# Patient Record
Sex: Male | Born: 1975 | Hispanic: Yes | Smoking: Former smoker
Health system: Southern US, Community
[De-identification: ages and names within clinical notes are randomized; demographics above are authoritative.]

## PROBLEM LIST (undated history)

## (undated) DIAGNOSIS — D631 Anemia in chronic kidney disease: Secondary | ICD-10-CM

## (undated) DIAGNOSIS — N529 Male erectile dysfunction, unspecified: Secondary | ICD-10-CM

## (undated) DIAGNOSIS — Z87898 Personal history of other specified conditions: Secondary | ICD-10-CM

## (undated) DIAGNOSIS — N184 Chronic kidney disease, stage 4 (severe): Secondary | ICD-10-CM

## (undated) DIAGNOSIS — E785 Hyperlipidemia, unspecified: Secondary | ICD-10-CM

## (undated) DIAGNOSIS — M8660 Other chronic osteomyelitis, unspecified site: Secondary | ICD-10-CM

## (undated) DIAGNOSIS — E119 Type 2 diabetes mellitus without complications: Secondary | ICD-10-CM

## (undated) DIAGNOSIS — Z794 Long term (current) use of insulin: Secondary | ICD-10-CM

## (undated) DIAGNOSIS — J069 Acute upper respiratory infection, unspecified: Secondary | ICD-10-CM

## (undated) DIAGNOSIS — I10 Essential (primary) hypertension: Secondary | ICD-10-CM

## (undated) DIAGNOSIS — N183 Chronic kidney disease, stage 3 unspecified: Secondary | ICD-10-CM

## (undated) DIAGNOSIS — T4145XA Adverse effect of unspecified anesthetic, initial encounter: Secondary | ICD-10-CM

## (undated) DIAGNOSIS — Z720 Tobacco use: Secondary | ICD-10-CM

## (undated) DIAGNOSIS — Z8489 Family history of other specified conditions: Secondary | ICD-10-CM

## (undated) DIAGNOSIS — E11621 Type 2 diabetes mellitus with foot ulcer: Secondary | ICD-10-CM

## (undated) DIAGNOSIS — E1122 Type 2 diabetes mellitus with diabetic chronic kidney disease: Secondary | ICD-10-CM

## (undated) DIAGNOSIS — R112 Nausea with vomiting, unspecified: Secondary | ICD-10-CM

## (undated) DIAGNOSIS — Z9889 Other specified postprocedural states: Secondary | ICD-10-CM

## (undated) DIAGNOSIS — E1140 Type 2 diabetes mellitus with diabetic neuropathy, unspecified: Secondary | ICD-10-CM

## (undated) DIAGNOSIS — L409 Psoriasis, unspecified: Secondary | ICD-10-CM

## (undated) DIAGNOSIS — T8859XA Other complications of anesthesia, initial encounter: Secondary | ICD-10-CM

## (undated) DIAGNOSIS — L97519 Non-pressure chronic ulcer of other part of right foot with unspecified severity: Secondary | ICD-10-CM

## (undated) DIAGNOSIS — G709 Myoneural disorder, unspecified: Secondary | ICD-10-CM

## (undated) DIAGNOSIS — K219 Gastro-esophageal reflux disease without esophagitis: Secondary | ICD-10-CM

## (undated) DIAGNOSIS — F329 Major depressive disorder, single episode, unspecified: Secondary | ICD-10-CM

## (undated) DIAGNOSIS — F32A Depression, unspecified: Secondary | ICD-10-CM

## (undated) DIAGNOSIS — Z9989 Dependence on other enabling machines and devices: Secondary | ICD-10-CM

## (undated) DIAGNOSIS — H3500 Unspecified background retinopathy: Secondary | ICD-10-CM

## (undated) HISTORY — DX: Type 2 diabetes mellitus with foot ulcer: E11.621

## (undated) HISTORY — DX: Other chronic osteomyelitis, unspecified site: M86.60

## (undated) HISTORY — PX: TOE AMPUTATION: SHX809

## (undated) HISTORY — PX: FOOT SURGERY: SHX648

---
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
## 2013-07-10 DIAGNOSIS — Z2821 Immunization not carried out because of patient refusal: Secondary | ICD-10-CM | POA: Insufficient documentation

## 2015-08-09 ENCOUNTER — Ambulatory Visit: Payer: Self-pay | Admitting: Podiatry

## 2015-10-06 DIAGNOSIS — L97529 Non-pressure chronic ulcer of other part of left foot with unspecified severity: Secondary | ICD-10-CM | POA: Insufficient documentation

## 2015-10-14 DIAGNOSIS — L0390 Cellulitis, unspecified: Secondary | ICD-10-CM | POA: Insufficient documentation

## 2015-11-13 HISTORY — PX: TOE AMPUTATION: SHX809

## 2015-12-15 DIAGNOSIS — IMO0002 Reserved for concepts with insufficient information to code with codable children: Secondary | ICD-10-CM | POA: Insufficient documentation

## 2016-08-16 ENCOUNTER — Encounter (HOSPITAL_COMMUNITY): Payer: Self-pay

## 2016-08-16 DIAGNOSIS — E1169 Type 2 diabetes mellitus with other specified complication: Secondary | ICD-10-CM | POA: Diagnosis present

## 2016-08-16 DIAGNOSIS — F17210 Nicotine dependence, cigarettes, uncomplicated: Secondary | ICD-10-CM | POA: Diagnosis present

## 2016-08-16 DIAGNOSIS — L97529 Non-pressure chronic ulcer of other part of left foot with unspecified severity: Secondary | ICD-10-CM | POA: Diagnosis present

## 2016-08-16 DIAGNOSIS — E1142 Type 2 diabetes mellitus with diabetic polyneuropathy: Secondary | ICD-10-CM | POA: Diagnosis present

## 2016-08-16 DIAGNOSIS — I10 Essential (primary) hypertension: Secondary | ICD-10-CM | POA: Diagnosis present

## 2016-08-16 DIAGNOSIS — M86672 Other chronic osteomyelitis, left ankle and foot: Secondary | ICD-10-CM | POA: Diagnosis present

## 2016-08-16 DIAGNOSIS — Z89422 Acquired absence of other left toe(s): Secondary | ICD-10-CM

## 2016-08-16 DIAGNOSIS — E11621 Type 2 diabetes mellitus with foot ulcer: Principal | ICD-10-CM | POA: Diagnosis present

## 2016-08-16 DIAGNOSIS — E1165 Type 2 diabetes mellitus with hyperglycemia: Secondary | ICD-10-CM | POA: Diagnosis present

## 2016-08-16 LAB — COMPREHENSIVE METABOLIC PANEL
ALT: 23 U/L (ref 17–63)
AST: 24 U/L (ref 15–41)
Albumin: 3.5 g/dL (ref 3.5–5.0)
Alkaline Phosphatase: 55 U/L (ref 38–126)
Anion gap: 9 (ref 5–15)
BUN: 10 mg/dL (ref 6–20)
CHLORIDE: 100 mmol/L — AB (ref 101–111)
CO2: 27 mmol/L (ref 22–32)
CREATININE: 0.72 mg/dL (ref 0.61–1.24)
Calcium: 9.2 mg/dL (ref 8.9–10.3)
GFR calc non Af Amer: >60 mL/min (ref >60)
Glucose, Bld: 161 mg/dL — ABNORMAL HIGH (ref 65–99)
POTASSIUM: 3.9 mmol/L (ref 3.5–5.1)
SODIUM: 136 mmol/L (ref 135–145)
Total Bilirubin: 0.5 mg/dL (ref 0.3–1.2)
Total Protein: 7.2 g/dL (ref 6.5–8.1)

## 2016-08-16 LAB — CBC WITH DIFFERENTIAL/PLATELET
Basophils Absolute: 0 10*3/uL (ref 0.0–0.1)
Basophils Relative: 0 %
EOS ABS: 0.5 10*3/uL (ref 0.0–0.7)
Eosinophils Relative: 5 %
HEMATOCRIT: 39.9 % (ref 39.0–52.0)
HEMOGLOBIN: 13.8 g/dL (ref 13.0–17.0)
LYMPHS ABS: 2.7 10*3/uL (ref 0.7–4.0)
LYMPHS PCT: 33 %
MCH: 31 pg (ref 26.0–34.0)
MCHC: 34.6 g/dL (ref 30.0–36.0)
MCV: 89.7 fL (ref 78.0–100.0)
MONOS PCT: 6 %
Monocytes Absolute: 0.5 10*3/uL (ref 0.1–1.0)
NEUTROS ABS: 4.6 10*3/uL (ref 1.7–7.7)
NEUTROS PCT: 56 %
Platelets: 294 10*3/uL (ref 150–400)
RBC: 4.45 MIL/uL (ref 4.22–5.81)
RDW: 12.5 % (ref 11.5–15.5)
WBC: 8.3 10*3/uL (ref 4.0–10.5)

## 2016-08-16 LAB — I-STAT CG4 LACTIC ACID, ED: LACTIC ACID, VENOUS: 1.52 mmol/L (ref 0.5–1.9)

## 2016-08-16 NOTE — ED Triage Notes (Signed)
Pt presents to the ed with complaints of having a sore on his left out side of his foot. The patient went to [facility] two weeks ago and was diagnosed with a diabetic ulcer, he was given antibiotics and fluids.  He states he has been keeping a close eye on his blood sugar, however his foot has been aching, red, tender, he had some fevers yesterday but not today.  He just finished his antibiotics last week.

## 2016-08-17 ENCOUNTER — Emergency Department (HOSPITAL_COMMUNITY): Payer: Self-pay

## 2016-08-17 ENCOUNTER — Inpatient Hospital Stay (HOSPITAL_COMMUNITY): Payer: Self-pay

## 2016-08-17 ENCOUNTER — Encounter (HOSPITAL_COMMUNITY): Payer: Self-pay | Admitting: Internal Medicine

## 2016-08-17 ENCOUNTER — Inpatient Hospital Stay (HOSPITAL_COMMUNITY)
Admission: EM | Admit: 2016-08-17 | Discharge: 2016-08-18 | DRG: 638 | Disposition: A | Discharge status: Home / Self Care | Payer: Self-pay | Attending: Family Medicine | Admitting: Family Medicine

## 2016-08-17 DIAGNOSIS — M869 Osteomyelitis, unspecified: Secondary | ICD-10-CM | POA: Diagnosis present

## 2016-08-17 DIAGNOSIS — E119 Type 2 diabetes mellitus without complications: Secondary | ICD-10-CM

## 2016-08-17 DIAGNOSIS — L089 Local infection of the skin and subcutaneous tissue, unspecified: Secondary | ICD-10-CM

## 2016-08-17 DIAGNOSIS — T148XXA Other injury of unspecified body region, initial encounter: Secondary | ICD-10-CM

## 2016-08-17 DIAGNOSIS — M7989 Other specified soft tissue disorders: Secondary | ICD-10-CM

## 2016-08-17 DIAGNOSIS — Z720 Tobacco use: Secondary | ICD-10-CM | POA: Diagnosis present

## 2016-08-17 DIAGNOSIS — I10 Essential (primary) hypertension: Secondary | ICD-10-CM | POA: Diagnosis present

## 2016-08-17 DIAGNOSIS — M86672 Other chronic osteomyelitis, left ankle and foot: Secondary | ICD-10-CM | POA: Diagnosis present

## 2016-08-17 DIAGNOSIS — I159 Secondary hypertension, unspecified: Secondary | ICD-10-CM

## 2016-08-17 DIAGNOSIS — E1169 Type 2 diabetes mellitus with other specified complication: Secondary | ICD-10-CM

## 2016-08-17 DIAGNOSIS — L97522 Non-pressure chronic ulcer of other part of left foot with fat layer exposed: Secondary | ICD-10-CM

## 2016-08-17 HISTORY — DX: Essential (primary) hypertension: I10

## 2016-08-17 HISTORY — DX: Type 2 diabetes mellitus without complications: E11.9

## 2016-08-17 HISTORY — DX: Tobacco use: Z72.0

## 2016-08-17 LAB — GLUCOSE, CAPILLARY
GLUCOSE-CAPILLARY: 189 mg/dL — AB (ref 65–99)
GLUCOSE-CAPILLARY: 169 mg/dL — AB (ref 65–99)
Glucose-Capillary: 196 mg/dL — ABNORMAL HIGH (ref 65–99)
Glucose-Capillary: 173 mg/dL — ABNORMAL HIGH (ref 65–99)
Glucose-Capillary: 193 mg/dL — ABNORMAL HIGH (ref 65–99)

## 2016-08-17 LAB — C-REACTIVE PROTEIN: CRP: 1.7 mg/dL — ABNORMAL HIGH

## 2016-08-17 LAB — PROTIME-INR
INR: 1.06
PROTHROMBIN TIME: 13.8 s (ref 11.4–15.2)

## 2016-08-17 LAB — SEDIMENTATION RATE: Sed Rate: 45 mm/hr — ABNORMAL HIGH (ref 0–16)

## 2016-08-17 LAB — I-STAT CG4 LACTIC ACID, ED: Lactic Acid, Venous: 1.29 mmol/L (ref 0.5–1.9)

## 2016-08-17 LAB — APTT: aPTT: 27 seconds (ref 24–36)

## 2016-08-17 LAB — HIV ANTIBODY (ROUTINE TESTING W REFLEX): HIV Screen 4th Generation wRfx: NONREACTIVE

## 2016-08-17 MED ORDER — NICOTINE 21 MG/24HR TD PT24
21.0000 mg | MEDICATED_PATCH | Freq: Every day | TRANSDERMAL | Status: DC
  Administered 2016-08-17: 21 mg via TRANSDERMAL
  Filled 2016-08-17 (×2): qty 1

## 2016-08-17 MED ORDER — INSULIN ASPART 100 UNIT/ML ~~LOC~~ SOLN: 0.0000 [IU] | Freq: Every day | SUBCUTANEOUS | Status: DC

## 2016-08-17 MED ORDER — SODIUM CHLORIDE 0.9 % IV SOLN
INTRAVENOUS | Status: DC
  Administered 2016-08-17 – 2016-08-18 (×3): via INTRAVENOUS

## 2016-08-17 MED ORDER — VANCOMYCIN HCL IN DEXTROSE 1-5 GM/200ML-% IV SOLN
1000.0000 mg | Freq: Once | INTRAVENOUS | Status: AC
  Administered 2016-08-17: 1000 mg via INTRAVENOUS
  Filled 2016-08-17: qty 200

## 2016-08-17 MED ORDER — SODIUM CHLORIDE 0.9 % IV BOLUS (SEPSIS)
1000.0000 mL | Freq: Once | INTRAVENOUS | Status: AC
  Administered 2016-08-17: 1000 mL via INTRAVENOUS

## 2016-08-17 MED ORDER — VANCOMYCIN HCL IN DEXTROSE 1-5 GM/200ML-% IV SOLN
1000.0000 mg | Freq: Three times a day (TID) | INTRAVENOUS | Status: DC
  Administered 2016-08-17 – 2016-08-18 (×3): 1000 mg via INTRAVENOUS
  Filled 2016-08-17 (×4): qty 200

## 2016-08-17 MED ORDER — ZOLPIDEM TARTRATE 5 MG PO TABS: 5.0000 mg | ORAL_TABLET | Freq: Every evening | ORAL | Status: DC | PRN

## 2016-08-17 MED ORDER — PIPERACILLIN-TAZOBACTAM 3.375 G IVPB 30 MIN
3.3750 g | Freq: Once | INTRAVENOUS | Status: AC
  Administered 2016-08-17: 3.375 g via INTRAVENOUS
  Filled 2016-08-17: qty 50

## 2016-08-17 MED ORDER — HYDRALAZINE HCL 25 MG PO TABS: 25.0000 mg | ORAL_TABLET | Freq: Four times a day (QID) | ORAL | Status: DC | PRN

## 2016-08-17 MED ORDER — ONDANSETRON HCL 4 MG/2ML IJ SOLN: 4.0000 mg | Freq: Three times a day (TID) | INTRAMUSCULAR | Status: DC | PRN

## 2016-08-17 MED ORDER — OXYCODONE-ACETAMINOPHEN 5-325 MG PO TABS
1.0000 | ORAL_TABLET | ORAL | Status: DC | PRN
  Administered 2016-08-17: 1 via ORAL
  Filled 2016-08-17 (×2): qty 1

## 2016-08-17 MED ORDER — ACETAMINOPHEN 325 MG PO TABS: 650.0000 mg | ORAL_TABLET | Freq: Four times a day (QID) | ORAL | Status: DC | PRN

## 2016-08-17 MED ORDER — AMLODIPINE BESYLATE 5 MG PO TABS
5.0000 mg | ORAL_TABLET | Freq: Every day | ORAL | Status: DC
  Administered 2016-08-17 – 2016-08-18 (×2): 5 mg via ORAL
  Filled 2016-08-17 (×2): qty 1

## 2016-08-17 MED ORDER — HYDRALAZINE HCL 20 MG/ML IJ SOLN
5.0000 mg | INTRAMUSCULAR | Status: DC | PRN
  Administered 2016-08-17: 5 mg via INTRAVENOUS
  Filled 2016-08-17: qty 1

## 2016-08-17 MED ORDER — INSULIN ASPART 100 UNIT/ML ~~LOC~~ SOLN
0.0000 [IU] | Freq: Three times a day (TID) | SUBCUTANEOUS | Status: DC
  Administered 2016-08-17 (×3): 2 [IU] via SUBCUTANEOUS
  Administered 2016-08-18: 1 [IU] via SUBCUTANEOUS

## 2016-08-17 MED ORDER — PIPERACILLIN-TAZOBACTAM 3.375 G IVPB
3.3750 g | Freq: Three times a day (TID) | INTRAVENOUS | Status: DC
  Administered 2016-08-17 – 2016-08-18 (×3): 3.375 g via INTRAVENOUS
  Filled 2016-08-17 (×4): qty 50

## 2016-08-17 MED ORDER — LISINOPRIL 10 MG PO TABS
10.0000 mg | ORAL_TABLET | Freq: Every day | ORAL | Status: DC
  Administered 2016-08-17 – 2016-08-18 (×2): 10 mg via ORAL
  Filled 2016-08-17 (×2): qty 1

## 2016-08-17 MED ORDER — ACETAMINOPHEN 650 MG RE SUPP: 650.0000 mg | Freq: Four times a day (QID) | RECTAL | Status: DC | PRN

## 2016-08-17 NOTE — Consult Note (Signed)
[place] [place] Nurse wound consult note Reason for Consult: foot ulcer Wound type: Full thickness Diabetic foot ulcer Pressure Injury POA: NA Measurement: 2cm x 2cm x 0.1cm Wound bed:75% pink, 25% white center Drainage (amount, consistency, odor) scant Periwound: calloused darkened area surrounding wound Dressing procedure/placement/frequency: I have just provided nurses with orders for wet to dry dressings BID.  Chart states that an ORTHO CONSULT wound be done this am. We will not follow at this time.  When Ortho consult is complete if you still need our assistance please re-consult.    Fara Olden, RN-C, WTA-C Wound Treatment Associate

## 2016-08-17 NOTE — H&P (Addendum)
History and Physical    Jorge Higgins ZMO:[id number] DOB: 11-26-1975 DOA: 08/17/2016  Referring MD/NP/PA:   PCP: Patient, No Pcp Per   Patient coming from:  The patient is coming from home.  At baseline, pt is independent for most of ADL.   Chief Complaint: left foot pain and ulcer  HPI: Jorge Higgins is a 41 y.o. 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  Pt states that he noted a sore in bottom of his left foot three weeks ago. He popped the sore and found that the sore was filled with pus. He has left foot pain and swelling. The patient is constant, 8 out of 10 in severity, nonradiating. It is associated with numbness and tingling in left lower leg and foot. Pt was seen In ED at [facility] 11 days ago. He was admninster IV antibiotics and prescribed a 10 day course of Amoxicillin  that he finished last week. Pt states that he continues to have pain and his foot swelling has been worsening. The pain is aggravated by ambulation and apparently weight. Patient has chills, no fever, no nausea, vomiting, diarrhea or abdominal pain. Denies chest pain, shortness breath, cough, symptoms of UTI.  ED Course: pt was found to have WBC 8.3, lactate 1.52, 1.29, electrolytes renal function okay, CRP 1.7, pending ESR, temperature normal, no tachycardia, oxygen saturation 96% on room air. X-ray of left foot showed soft tissue bony sequestra suspicious for changes of chronic osteomyelitis. Pt is admitted to med-surg as inpt.  Review of Systems:   General: no fevers, has chills, no changes in body weight, has fatigue HEENT: no blurry vision, hearing changes or sore throat Respiratory: no dyspnea, coughing, wheezing CV: no chest pain, no palpitations GI: no nausea, vomiting, abdominal pain, diarrhea, constipation GU: no dysuria, burning on urination, increased urinary  frequency, hematuria  Ext: has left foot and ankle swelling. Neuro: no unilateral weakness, numbness, or tingling, no vision change or hearing loss Skin: no rash, no skin tear. Has ulcer in left foot bottom MSK: No muscle spasm, no deformity, no limitation of range of movement in spin Heme: No easy bruising.  Travel history: No recent long distant travel.  Allergy: Patient states that he is allergic to metal, causing rashes when he wears metal necklace  Past Medical History:  Diagnosis Date  . Diabetes mellitus without complication ([place])   . Hypertension   . Tobacco abuse     Past Surgical History:  Procedure Laterality Date  . TOE AMPUTATION      Social History:  reports that he has been smoking.  He has never used smokeless tobacco. He reports that he drinks alcohol. He reports that he does not use drugs.  Family History:  Family History  Problem Relation Age of Onset  . Diabetes Mellitus II Mother   . Hypertension Mother   . Diabetes Mellitus II Sister      Prior to Admission medications   Medication Sig Start Date End Date Taking? Authorizing Provider  Glimepiride (AMARYL PO) Take 1 tablet by mouth 2 (two) times daily.   Yes [provider]    Physical Exam: Vitals:   08/17/16 0239 08/17/16 0245 08/17/16 0300 08/17/16 0515  BP:  (!) 163/101 (!) 149/98 (!) 161/94  Pulse: 90 84 83 83  Resp:   16   Temp:      SpO2:  96%  96% 96%  Weight:      Height:       General: Not in acute distress HEENT:       Eyes: PERRL, EOMI, no scleral icterus.       ENT: No discharge from the ears and nose, no pharynx injection, no tonsillar enlargement.        Neck: No JVD, no bruit, no mass felt. Heme: No neck lymph node enlargement. Cardiac: S1/S2, RRR, No murmurs, No gallops or rubs. Respiratory: No rales, wheezing, rhonchi or rubs. GI: Soft, nondistended, nontender, no rebound pain, no organomegaly, BS present. GU: No hematuria Ext:  2+DP/PT pulse bilaterally. Has an  ulcer in the sole of left foot at the MTP joint of the 5th toe, approximately 1.5 cm in size. The left foot is swelling, extending up to ankle and lower leg. No pus draining or erythema.  Musculoskeletal: No joint deformities, No joint redness or warmth, no limitation of ROM in spin. Skin: No rashes.  Neuro: Alert, oriented X3, cranial nerves II-XII grossly intact, moves all extremities normally. Psych: Patient is not psychotic, no suicidal or hemocidal ideation.  Labs on Admission: I have personally reviewed following labs and imaging studies  CBC:  Recent Labs Lab 08/16/16 2218  WBC 8.3  NEUTROABS 4.6  HGB 13.8  HCT 39.9  MCV 89.7  PLT 150   Basic Metabolic Panel:  Recent Labs Lab 08/16/16 2218  NA 136  K 3.9  CL 100*  CO2 27  GLUCOSE 161*  BUN 10  CREATININE 0.72  CALCIUM 9.2   GFR: Estimated Creatinine Clearance: 120.3 mL/min (by C-G formula based on SCr of 0.72 mg/dL). Liver Function Tests:  Recent Labs Lab 08/16/16 2218  AST 24  ALT 23  ALKPHOS 55  BILITOT 0.5  PROT 7.2  ALBUMIN 3.5   No results for input(s): LIPASE, AMYLASE in the last 168 hours. No results for input(s): AMMONIA in the last 168 hours. Coagulation Profile: No results for input(s): INR, PROTIME in the last 168 hours. Cardiac Enzymes: No results for input(s): CKTOTAL, CKMB, CKMBINDEX, TROPONINI in the last 168 hours. BNP (last 3 results) No results for input(s): PROBNP in the last 8760 hours. HbA1C: No results for input(s): HGBA1C in the last 72 hours. CBG: No results for input(s): GLUCAP in the last 168 hours. Lipid Profile: No results for input(s): CHOL, HDL, LDLCALC, TRIG, CHOLHDL, LDLDIRECT in the last 72 hours. Thyroid Function Tests: No results for input(s): TSH, T4TOTAL, FREET4, T3FREE, THYROIDAB in the last 72 hours. Anemia Panel: No results for input(s): VITAMINB12, FOLATE, FERRITIN, TIBC, IRON, RETICCTPCT in the last 72 hours. Urine analysis: No results found for:  COLORURINE, APPEARANCEUR, LABSPEC, PHURINE, GLUCOSEU, HGBUR, BILIRUBINUR, KETONESUR, PROTEINUR, UROBILINOGEN, NITRITE, LEUKOCYTESUR Sepsis Labs: '@LABRCNTIP' (procalcitonin:4,lacticidven:4) )No results found for this or any previous visit (from the past 240 hour(s)).   Radiological Exams on Admission: Dg Foot Complete Left  Result Date: 08/17/2016 CLINICAL DATA:  Foot sore along the lateral aspect of the foot. EXAM: LEFT FOOT - COMPLETE 3+ VIEW COMPARISON:  None. FINDINGS: There is soft tissue swelling about the malleoli and foot in particular adjacent to the fifth MTP articulation laterally. Vascular calcifications in keeping with diabetes is noted. The patient has undergone amputation of the fourth ray involving the distal fourth metatarsal. Periosteal new bone formation is seen off the tip of the amputation site with small bony sequestra no acute fracture or malalignment of the foot is seen. Which may reflect changes of chronic osteomyelitis. IMPRESSION: 1. Status  post fourth ray amputation at the level of the distal fourth metatarsal with periosteal new bone formation and soft tissue bony sequestra suspicious for changes of chronic osteomyelitis. 2. Diffuse soft tissue swelling is noted of the foot and ankle, more subtle adjacent to the fifth MTP joint laterally and about the malleoli. 3. No acute fracture nor dislocations of the left foot. Electronically Signed   By: Ashley Royalty M.D.   On: 08/17/2016 03:37     EKG: Not done in ED, will get one.   Assessment/Plan Principal Problem:   Chronic osteomyelitis of left foot (HCC) Active Problems:   Hypertension   Diabetes mellitus without complication (HCC)   Tobacco abuse   Osteomyelitis of left foot (HCC)   Chronic osteomyelitis of left foot ([place]): pt has left foot ulcer with infection, possible chronic osteomyelitis as suggested by chest x-ray findings. No fever or leukocytosis. Lactate is normal. Clinically nonseptic. Patient failed outpatient  oral antibiotic treatment. CRP 1.7.  - will admit to med-surg bed as inpt - Empiric antimicrobial treatment with vancomycin and Zosyn per pharmacy - PRN Zofran for nausea, Percocet for pain - Blood cultures x 2  - f/u ESR - wound care consult - IVF: 1.0 L of NS bolus in ED, followed by 125 cc/h - INR/PTT - will get ABI - will get Left LE venous doppler to r/o DVT - Please consult ortho in AM  HTN: -continue lisinopril -IVF hydralazine when necessary  Tobacco abuse: -Did counseling about importance of quitting smoking -Nicotine patch  DM-II: Last A1c not on record. Patient is taking glyburide at home per patient -SSI -Check A1c   DVT ppx: SCD only to the right leg (please apply to the left leg when DVT is ruled out). Code Status: Full code Family Communication: Yes, patient's wife at bed side Disposition Plan:  Anticipate discharge back to previous home environment Consults called:  none Admission status: medical floor/inpt  Date of Service 08/17/2016    Ivor Costa [facility] Pager [phone]  If 7PM-7AM, please contact night-coverage [email or web address] Password [facility] 08/17/2016, 5:28 AM

## 2016-08-17 NOTE — Progress Notes (Signed)
Pharmacy Antibiotic Note  Jorge Higgins is a 41 y.o. male admitted on 08/17/2016 with L foot osteomyelitis. Noted pt went to [facility] and received abx for diabetic foot ulcer treatment -finished 10-day amoxicillin course last week. Pharmacy has been consulted for Vancomycin and Zosyn dosing.  Pt received Vanc 1gm and Zosyn 3.375gm IV in ED ~0400  Plan: Zosyn 3.375gm IV q8h -  doses over 4 hours Vancomycin 1gm IV q8h Will f/u micro data, renal function, and pt's clinical condition Vanc trough prn   Height: 5\' 9"  (175.3 cm) Weight: 180 lb (81.6 kg) IBW/kg (Calculated) : 70.7  Temp (24hrs), Avg:97.8 F (36.6 C), Min:97.8 F (36.6 C), Max:97.8 F (36.6 C)   Recent Labs Lab 08/16/16 2218 08/16/16 2239 08/17/16 0325  WBC 8.3  --   --   CREATININE 0.72  --   --   LATICACIDVEN  --  1.52 1.29    Estimated Creatinine Clearance: 120.3 mL/min (by C-G formula based on SCr of 0.72 mg/dL).    No Known Allergies  Antimicrobials this admission: 5/17 Vanc >>  5/17 Zosyn >>   Dose adjustments this admission: n/a  Microbiology results: 5/17 BCx x2:  5/17 L foot wound Cx:    Thank you for allowing pharmacy to be a part of this patient's care.  Sherlon Handing, PharmD, BCPS Clinical pharmacist, pager [phone] 08/17/2016 5:09 AM

## 2016-08-17 NOTE — Progress Notes (Signed)
ER report received. Room ready.

## 2016-08-17 NOTE — Progress Notes (Signed)
Subjective: Patient admitted this morning, see detailed H&P by Dr Blaine Hamper  a 41 y.o. 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 X-ray of the foot showed chronic osteomyelitis of left foot, patient started on vancomycin and Zosyn.  Vitals:   08/17/16 0617 08/17/16 0932  BP: (!) 169/95 (!) 149/85  Pulse: 82 73  Resp: 18 17  Temp: 97.9 F (36.6 C) 97.7 F (36.5 C)      A/P  Chronic osteomyelitis of left foot Hypertension Diabetes mellitus  We will consult podiatry. Continue IV antibiotics   [place] Hospitalist [phone]

## 2016-08-17 NOTE — Progress Notes (Signed)
*  PRELIMINARY RESULTS* Vascular Ultrasound Left lower extremity venous duplex has been completed.  Preliminary findings: No evidence of deep vein thrombosis or baker's cyst in the left lower extremity.  Everrett Coombe 08/17/2016, 4:06 PM

## 2016-08-17 NOTE — ED Provider Notes (Signed)
[facility] DEPT Provider Note   CSN: [id number] Arrival date & time: 08/16/16  2203  By signing my name below, I, Lise Auer, attest that this documentation has been prepared under the direction and in the presence of Rolland Porter, MD. Electronically Signed: Lise Auer, ED Scribe. 08/17/16. 3:17 AM.  Time seen 02:39 AM (Pt not in room at 02:10 AM or 02:25 AM when I entered the room)  History   Chief Complaint Chief Complaint  Patient presents with  . Foot Swelling   The history is provided by the patient. No language interpreter was used.    HPI Comments: Jorge Higgins is a 41 y.o. male with a PMHx of DM since age 43 and HTN, who presents to the Emergency Department complaining of gradually worsening ulcer on the bottom of his left foot. His associated symptom includes numbness and tingling. This started three weeks ago when pt notes he purchased new shoes that were too tight and a sore formed. He popped the sore which he notes was "filled with pus". He went to the ED at [facility] and was admninster IV antibiotics and prescribed a 10 day course of Amoxicillin  that he finished last week. The pain resolved but the ulcer was still present. Two days ago he noticed intermittent swelling that has gotten worse. He had a similar episodes like such previously which resulted in him having a toe amputated. Pt notes with bearing weight and ambulation his pain is worsened. CBG has been controlled. Occasional smoker.He denies any erthyema at this time, fever, red streaks, drainage from the wound or any other acute associated symptoms at this time.    PCPs at [facility].  Past Medical History:  Diagnosis Date  . Diabetes mellitus without complication ([place])   . Hypertension   . Tobacco abuse     Patient Active Problem List   Diagnosis Date Noted  . Chronic osteomyelitis of left foot ([place]) 08/17/2016  . Osteomyelitis of left foot ([place]) 08/17/2016  . Hypertension   . Diabetes mellitus  without complication ([place])   . Tobacco abuse     Past Surgical History:  Procedure Laterality Date  . TOE AMPUTATION      Home Medications    Prior to Admission medications   Medication Sig Start Date End Date Taking? Authorizing Provider  glyBURIDE (DIABETA) 5 MG tablet Take 5 mg by mouth 2 (two) times daily with a meal.   Yes [provider]    Family History Family History  Problem Relation Age of Onset  . Diabetes Mellitus II Mother   . Hypertension Mother   . Diabetes Mellitus II Sister     Social History Social History  Substance Use Topics  . Smoking status: Current Some Day Smoker  . Smokeless tobacco: Never Used  . Alcohol use Yes  employed   Allergies   Patient has no known allergies.   Review of Systems Review of Systems  All other systems reviewed and are negative.    Physical Exam Updated Vital Signs BP (!) 164/106   Pulse 92   Temp 97.8 F (36.6 C)   Resp 18   Ht 5\' 9"  (1.753 m)   Wt 180 lb (81.6 kg)   SpO2 100%   BMI 26.58 kg/m   Vital signs normal Except for hypertension   Physical Exam  Constitutional: He is oriented to person, place, and time. He appears well-developed and well-nourished.  Non-toxic appearance. He does not appear ill. No distress.  HENT:  Head: Normocephalic and atraumatic.  Right Ear: External ear normal.  Left Ear: External ear normal.  Nose: Nose normal. No mucosal edema or rhinorrhea.  Mouth/Throat: Oropharynx is clear and moist and mucous membranes are normal. No dental abscesses or uvula swelling.  Eyes: Conjunctivae and EOM are normal. Pupils are equal, round, and reactive to light.  Neck: Normal range of motion and full passive range of motion without pain. Neck supple.  Cardiovascular: Normal rate, regular rhythm and normal heart sounds.  Exam reveals no gallop and no friction rub.   No murmur heard. Pulmonary/Chest: Effort normal and breath sounds normal. No respiratory distress. He has no  wheezes. He has no rhonchi. He has no rales. He exhibits no tenderness and no crepitus.  Abdominal: Soft. Normal appearance and bowel sounds are normal. He exhibits no distension. There is no tenderness. There is no rebound and no guarding.  Musculoskeletal: Normal range of motion. He exhibits no edema or tenderness.  Moves all extremities well.  S/P amputation of his Lt 4th toe  Neurological: He is alert and oriented to person, place, and time. He has normal strength. No cranial nerve deficit.  Skin: Skin is warm, dry and intact. No rash noted. No erythema. No pallor.  Ulcer on the sole of left foot at the MTP joint of the little toe. 1.5 cm in size. It has what appears to be callous formation around it.  Whole left foot is swollen and warm to touch. Swelling extends almost up to his knees.   Psychiatric: He has a normal mood and affect. His speech is normal and behavior is normal. His mood appears not anxious.  Nursing note and vitals reviewed.     ED Treatments / Results  Labs (all labs ordered are listed, but only abnormal results are displayed) Results for orders placed or performed during the hospital encounter of 08/17/16  Comprehensive metabolic panel  Result Value Ref Range   Sodium 136 135 - 145 mmol/L   Potassium 3.9 3.5 - 5.1 mmol/L   Chloride 100 (L) 101 - 111 mmol/L   CO2 27 22 - 32 mmol/L   Glucose, Bld 161 (H) 65 - 99 mg/dL   BUN 10 6 - 20 mg/dL   Creatinine, Ser 0.72 0.61 - 1.24 mg/dL   Calcium 9.2 8.9 - 10.3 mg/dL   Total Protein 7.2 6.5 - 8.1 g/dL   Albumin 3.5 3.5 - 5.0 g/dL   AST 24 15 - 41 U/L   ALT 23 17 - 63 U/L   Alkaline Phosphatase 55 38 - 126 U/L   Total Bilirubin 0.5 0.3 - 1.2 mg/dL   GFR calc non Af Amer >60 >60 mL/min   GFR calc Af Amer >60 >60 mL/min   Anion gap 9 5 - 15  CBC with Differential  Result Value Ref Range   WBC 8.3 4.0 - 10.5 K/uL   RBC 4.45 4.22 - 5.81 MIL/uL   Hemoglobin 13.8 13.0 - 17.0 g/dL   HCT 39.9 39.0 - 52.0 %   MCV 89.7  78.0 - 100.0 fL   MCH 31.0 26.0 - 34.0 pg   MCHC 34.6 30.0 - 36.0 g/dL   RDW 12.5 11.5 - 15.5 %   Platelets 294 150 - 400 K/uL   Neutrophils Relative % 56 %   Neutro Abs 4.6 1.7 - 7.7 K/uL   Lymphocytes Relative 33 %   Lymphs Abs 2.7 0.7 - 4.0 K/uL   Monocytes Relative 6 %   Monocytes Absolute  0.5 0.1 - 1.0 K/uL   Eosinophils Relative 5 %   Eosinophils Absolute 0.5 0.0 - 0.7 K/uL   Basophils Relative 0 %   Basophils Absolute 0.0 0.0 - 0.1 K/uL  Sedimentation rate  Result Value Ref Range   Sed Rate 45 (H) 0 - 16 mm/hr  C-reactive protein  Result Value Ref Range   CRP 1.7 (H) <1.0 mg/dL  Protime-INR  Result Value Ref Range   Prothrombin Time 13.8 11.4 - 15.2 seconds   INR 1.06   APTT  Result Value Ref Range   aPTT 27 24 - 36 seconds  Glucose, capillary  Result Value Ref Range   Glucose-Capillary 196 (H) 65 - 99 mg/dL   Comment 1 Notify RN    Comment 2 Document in Chart   I-Stat CG4 Lactic Acid, ED  Result Value Ref Range   Lactic Acid, Venous 1.52 0.5 - 1.9 mmol/L  I-Stat CG4 Lactic Acid, ED  Result Value Ref Range   Lactic Acid, Venous 1.29 0.5 - 1.9 mmol/L   Laboratory interpretation all normal except elevated CRP and Sed rate    EKG  EKG Interpretation None       Radiology Dg Foot Complete Left  Result Date: 08/17/2016 CLINICAL DATA:  Foot sore along the lateral aspect of the foot. EXAM: LEFT FOOT - COMPLETE 3+ VIEW COMPARISON:  None. FINDINGS: There is soft tissue swelling about the malleoli and foot in particular adjacent to the fifth MTP articulation laterally. Vascular calcifications in keeping with diabetes is noted. The patient has undergone amputation of the fourth ray involving the distal fourth metatarsal. Periosteal new bone formation is seen off the tip of the amputation site with small bony sequestra no acute fracture or malalignment of the foot is seen. Which may reflect changes of chronic osteomyelitis. IMPRESSION: 1. Status post fourth ray  amputation at the level of the distal fourth metatarsal with periosteal new bone formation and soft tissue bony sequestra suspicious for changes of chronic osteomyelitis. 2. Diffuse soft tissue swelling is noted of the foot and ankle, more subtle adjacent to the fifth MTP joint laterally and about the malleoli. 3. No acute fracture nor dislocations of the left foot. Electronically Signed   By: Ashley Royalty M.D.   On: 08/17/2016 03:37    Procedures Procedures (including critical care time)  Medications Ordered in ED Medications  vancomycin (VANCOCIN) IVPB 1000 mg/200 mL premix (0 mg Intravenous Stopped 08/17/16 0451)  piperacillin-tazobactam (ZOSYN) IVPB 3.375 g (0 g Intravenous Stopped 08/17/16 0421)  sodium chloride 0.9 % bolus 1,000 mL (0 mLs Intravenous Stopped 08/17/16 0629)     Initial Impression / Assessment and Plan / ED Course  I have reviewed the triage vital signs and the nursing notes.  Pertinent labs & imaging results that were available during my care of the patient were reviewed by me and considered in my medical decision making (see chart for details).    DIAGNOSTIC STUDIES: Oxygen Saturation is 100% on RA, normal by my interpretation.   COORDINATION OF CARE: 3:07 AM-Discussed next steps with pt. Pt verbalized understanding and is agreeable with the plan. Patient was started on Zosyn and vancomycin IV. X-rays were ordered of his foot. Laboratory testing was initiated.  We discussed his test results and x-ray results, unfortunately his x-ray suggestive of osteomyelitis. Patient is agreeable for admission, he is concerned about having to have another amputation done.  04:45 AM Dr Blaine Hamper, will admit  Final Clinical Impressions(s) / ED Diagnoses  Final diagnoses:  Foot ulcer with fat layer exposed, left ([place])  Wound infection  Osteomyelitis of left foot, unspecified type ([place])  Secondary hypertension  Diabetes mellitus without complication ([place])  Tobacco abuse    Osteomyelitis of left foot ([place])  Left leg swelling    Plan admission  Rolland Porter, MD, FACEP   I personally performed the services described in this documentation, which was scribed in my presence. The recorded information has been reviewed and considered.  Rolland Porter, MD, Barbette Or, MD 08/17/16 [phone]

## 2016-08-18 DIAGNOSIS — M7989 Other specified soft tissue disorders: Secondary | ICD-10-CM

## 2016-08-18 DIAGNOSIS — E11621 Type 2 diabetes mellitus with foot ulcer: Secondary | ICD-10-CM

## 2016-08-18 LAB — BASIC METABOLIC PANEL
ANION GAP: 7 (ref 5–15)
BUN: 8 mg/dL (ref 6–20)
CHLORIDE: 104 mmol/L (ref 101–111)
CO2: 23 mmol/L (ref 22–32)
CREATININE: 0.73 mg/dL (ref 0.61–1.24)
Calcium: 8.7 mg/dL — ABNORMAL LOW (ref 8.9–10.3)
GFR calc non Af Amer: >60 mL/min (ref >60)
Glucose, Bld: 191 mg/dL — ABNORMAL HIGH (ref 65–99)
Potassium: 3.9 mmol/L (ref 3.5–5.1)
SODIUM: 134 mmol/L — AB (ref 135–145)

## 2016-08-18 LAB — CBC
HCT: 37.9 % — ABNORMAL LOW (ref 39.0–52.0)
HEMOGLOBIN: 12.9 g/dL — AB (ref 13.0–17.0)
MCH: 30.4 pg (ref 26.0–34.0)
MCHC: 34 g/dL (ref 30.0–36.0)
MCV: 89.4 fL (ref 78.0–100.0)
Platelets: 263 10*3/uL (ref 150–400)
RBC: 4.24 MIL/uL (ref 4.22–5.81)
RDW: 12.4 % (ref 11.5–15.5)
WBC: 6.9 10*3/uL (ref 4.0–10.5)

## 2016-08-18 LAB — GLUCOSE, CAPILLARY: Glucose-Capillary: 137 mg/dL — ABNORMAL HIGH (ref 65–99)

## 2016-08-18 LAB — HEMOGLOBIN A1C
Hgb A1c MFr Bld: 9.1 % — ABNORMAL HIGH (ref 4.8–5.6)
MEAN PLASMA GLUCOSE: 214 mg/dL

## 2016-08-18 MED ORDER — OXYCODONE-ACETAMINOPHEN 5-325 MG PO TABS: 1.0000 | ORAL_TABLET | ORAL | 0 refills | Status: DC | PRN

## 2016-08-18 MED ORDER — AMLODIPINE BESYLATE 5 MG PO TABS: 5.0000 mg | ORAL_TABLET | Freq: Every day | ORAL | 2 refills | Status: DC

## 2016-08-18 MED ORDER — LISINOPRIL 10 MG PO TABS
10.0000 mg | ORAL_TABLET | Freq: Once | ORAL | Status: AC
  Administered 2016-08-18: 10 mg via ORAL
  Filled 2016-08-18: qty 1

## 2016-08-18 MED ORDER — SULFAMETHOXAZOLE-TRIMETHOPRIM 800-160 MG PO TABS: 1.0000 | ORAL_TABLET | Freq: Two times a day (BID) | ORAL | 0 refills | Status: DC

## 2016-08-18 MED ORDER — LISINOPRIL 20 MG PO TABS: 20.0000 mg | ORAL_TABLET | Freq: Every day | ORAL | 2 refills | Status: DC

## 2016-08-18 MED ORDER — LISINOPRIL 20 MG PO TABS: 20.0000 mg | ORAL_TABLET | Freq: Every day | ORAL | Status: DC

## 2016-08-18 NOTE — Consult Note (Signed)
    PODIATRY CONSULT  REASON FOR CONSULT: Diabetic ulcer left foot  HPI: A 41 year old male with a history of diabetes mellitus and amputation to the left foot presents in the hospital as an inpatient at [facility] for evaluation of a left diabetic foot ulcer. Patient states that the ulcers been present for several weeks now and he was recently seen at [facility] where amoxicillin antibiotics were prescribed. Patient admitted to [facility] on 08/17/2016 and podiatry consult.    Physical Exam: General: The patient is alert and oriented x3 in no acute distress.  Dermatology: Left plantar forefoot ulceration noted sub-fifth MPJ measuring approximately [phone] cm (LxWxD). To the noted ulceration there is no eschar. There is a moderate amount of slough fibrin and necrotic tissue noted. Wound base appears to be somewhat granular. Moderate serous drainage noted. No distinct malodor. There is no exposed bone muscle-tendon ligament or joint. Periwound integrity is callused and mildly macerated.  Vascular: Palpable pedal pulses bilaterally. No edema or erythema noted.   Neurological: Epicritic and protective threshold absent  bilaterally.   Musculoskeletal Exam: History of left fourth partial ray amputation which appears to have healed uneventfully.  Radiographic Exam:  X-rays taken in in the emergency department are consistent with fourth digit amputation partial ray. I personally reviewed the radiographs and did not find a correlation between the clinical and radiographic exam consistent with chronic osteomyelitis. No cortical irregularity or periosteal reaction noted to the fifth metatarsal head underlying the presenting ulcer, however there is some consistent soft tissue swelling noted.  Assessment: 1. Diabetes mellitus w/ polyneuropathy 2. Ulcer left foot secondary to diabetes mellitus 3. H/o fourth partial ray amputation left foot   Plan of Care:  1. Patient was evaluated. I  personally reviewed the radiographs 2. At the moment I do not find significant evidence of osteomyelitis when comparing radiographic and clinical exam findings. Patient can likely be managed on an outpatient setting. 3. Recommend oral antibiotics upon discharge, consider broad-spectrum, such as Bactrim DS, until culture results are finalized. 4. From a lower extremity standpoint the patient is okay to be discharged and to follow up in the office outpatient, either in my office or at the [facility] wound care center  Thank you for the consultation. Please contact me directly with any questions or concerns. Mobile: [phone]   [name], DPM [facility]  Dr. [name], [address]                                        [place], [place] [address]                Office [phone]  Fax [phone]

## 2016-08-18 NOTE — Progress Notes (Signed)
Patient discharge teaching complete. Meds, diet, follow up appointments and wound care reviewed and all questions answered. Patient d/d'd by wheelchair to car in ED parking lot.

## 2016-08-18 NOTE — Discharge Summary (Addendum)
Physician Discharge Summary  Jorge Higgins VFI:[id number] DOB: 06-29-1975 DOA: 08/17/2016  PCP: Patient, No Pcp Per  Admit date: 08/17/2016 Discharge date: 08/18/2016  Time spent: 25* minutes  Recommendations for Outpatient Follow-up:  1. *Follow up Podiatry Dr  Carmie End in 1 week   Discharge Diagnoses:  Principal Problem: Diabetic foot ulcer Active Problems:   Hypertension   Diabetes mellitus without complication ([place])   Tobacco abuse   Discharge Condition: Stable  Diet recommendation: carb modified diet  Filed Weights   08/16/16 2212 08/17/16 0617 08/17/16 2145  Weight: 81.6 kg (180 lb) 87.7 kg (193 lb 6.4 oz) 88 kg (194 lb)    History of present illness:  Patient admitted this morning, see detailed H&P by Dr Blaine Hamper a 41 y.o.malewith medical history significant of tobacco abuse, hypertension, diabetes mellitus, s/p of left 4th toe amputation, who presents with a left foot pain and ulcer. X-ray of the foot showed chronic osteomyelitis of left foot, patient started on vancomycin and Zosyn.  Hospital Course:   Diabetic foot ulcer- patient was started on vancomycin and Zosyn, podiatry was consulted. After reviewing the x-ray films of left foot, podiatrist did not feel that patient had osteomyelitis. He recommends patient to be discharged on Bactrim and he will follow patient as outpatient. Will discharge patient on Bactrim 1 tablet by mouth twice a day. Wound culture showed no organisms, final result is pending.  Left leg swelling-resolved, lower extremity venous duplex was negative for DVT.  Hypertension- blood pressure elevated in the hospital, patient takes lisinopril 10 mg by mouth daily. Will change lisinopril to 20 mg by mouth daily, add amlodipine 5 mg by mouth daily.  Diabetes mellitus-uncontrolled, hemoglobin A1c 9.1. Patient takes glyburide 5 mg by mouth twice a day. He will follow-up with the PCP for adjustment of his medications as outpatient.  Return to  work- Q return to work has been provided, patient can return to work on 08/21/2016.  Procedures:  None   Consultations:  Podiatry  Discharge Exam: Vitals:   08/18/16 0908 08/18/16 1050  BP: (!) 180/104 (!) 178/98  Pulse: 93   Resp: 18   Temp: 97.9 F (36.6 C)     General: Appears in no acute distress Cardiovascular: RRR, S1S2 Respiratory: Clear to auscultation bilaterally  Discharge Instructions   Discharge Instructions    Diet - low sodium heart healthy    Complete by:  As directed    Increase activity slowly    Complete by:  As directed      Current Discharge Medication List    START taking these medications   Details  amLODipine (NORVASC) 5 MG tablet Take 1 tablet (5 mg total) by mouth daily. Qty: 30 tablet, Refills: 2    lisinopril (PRINIVIL,ZESTRIL) 20 MG tablet Take 1 tablet (20 mg total) by mouth daily. Qty: 30 tablet, Refills: 2    oxyCODONE-acetaminophen (PERCOCET/ROXICET) 5-325 MG tablet Take 1 tablet by mouth every 4 (four) hours as needed for moderate pain. Qty: 20 tablet, Refills: 0    sulfamethoxazole-trimethoprim (BACTRIM DS,SEPTRA DS) 800-160 MG tablet Take 1 tablet by mouth 2 (two) times daily. Qty: 28 tablet, Refills: 0      CONTINUE these medications which have NOT CHANGED   Details  glyBURIDE (DIABETA) 5 MG tablet Take 5 mg by mouth 2 (two) times daily with a meal.       No Known Allergies Follow-up Information    [name], DPM. Schedule an appointment as soon as possible for  a visit in 1 week(s).   Specialty:  Podiatry Contact information: [address] [address] [phone]            The results of significant diagnostics from this hospitalization (including imaging, microbiology, ancillary and laboratory) are listed below for reference.    Significant Diagnostic Studies: Dg Foot Complete Left  Result Date: 08/17/2016 CLINICAL DATA:  Foot sore along the lateral aspect of the foot. EXAM:  LEFT FOOT - COMPLETE 3+ VIEW COMPARISON:  None. FINDINGS: There is soft tissue swelling about the malleoli and foot in particular adjacent to the fifth MTP articulation laterally. Vascular calcifications in keeping with diabetes is noted. The patient has undergone amputation of the fourth ray involving the distal fourth metatarsal. Periosteal new bone formation is seen off the tip of the amputation site with small bony sequestra no acute fracture or malalignment of the foot is seen. Which may reflect changes of chronic osteomyelitis. IMPRESSION: 1. Status post fourth ray amputation at the level of the distal fourth metatarsal with periosteal new bone formation and soft tissue bony sequestra suspicious for changes of chronic osteomyelitis. 2. Diffuse soft tissue swelling is noted of the foot and ankle, more subtle adjacent to the fifth MTP joint laterally and about the malleoli. 3. No acute fracture nor dislocations of the left foot. Electronically Signed   By: Ashley Royalty M.D.   On: 08/17/2016 03:37    Microbiology: Recent Results (from the past 240 hour(s))  Wound or Superficial Culture     Status: None (Preliminary result)   Collection Time: 08/17/16  3:08 AM  Result Value Ref Range Status   Specimen Description FOOT LEFT  Final   Special Requests Immunocompromised  Final   Gram Stain NO WBC SEEN NO ORGANISMS SEEN   Final   Culture PENDING  Incomplete   Report Status PENDING  Incomplete     Labs: Basic Metabolic Panel:  Recent Labs Lab 08/16/16 2218 08/18/16 0604  NA 136 134*  K 3.9 3.9  CL 100* 104  CO2 27 23  GLUCOSE 161* 191*  BUN 10 8  CREATININE 0.72 0.73  CALCIUM 9.2 8.7*   Liver Function Tests:  Recent Labs Lab 08/16/16 2218  AST 24  ALT 23  ALKPHOS 55  BILITOT 0.5  PROT 7.2  ALBUMIN 3.5   No results for input(s): LIPASE, AMYLASE in the last 168 hours. No results for input(s): AMMONIA in the last 168 hours. CBC:  Recent Labs Lab 08/16/16 2218 08/18/16 0604   WBC 8.3 6.9  NEUTROABS 4.6  --   HGB 13.8 12.9*  HCT 39.9 37.9*  MCV 89.7 89.4  PLT 294 263   Cardiac Enzymes: No results for input(s): CKTOTAL, CKMB, CKMBINDEX, TROPONINI in the last 168 hours. BNP: BNP (last 3 results) No results for input(s): BNP in the last 8760 hours.  ProBNP (last 3 results) No results for input(s): PROBNP in the last 8760 hours.  CBG:  Recent Labs Lab 08/17/16 0748 08/17/16 1158 08/17/16 1629 08/17/16 2146 08/18/16 0801  GLUCAP 189* 173* 169* 193* 137*       Signed:  Eleonore Chiquito S MD.  [facility] 08/18/2016, 10:54 AM

## 2016-08-18 NOTE — Care Management Note (Signed)
Case Management Note  Patient Details  Name: Jorge Higgins MRN: [id number] Date of Birth: 1975-04-04  Subjective/Objective:                 Spoke with patient at the bedside. Patient states his PCP is Dr Rosana Hoes with [facility] physicians. No CM needs identified at this time.    Action/Plan:   Expected Discharge Date:  08/18/16               Expected Discharge Plan:  Home/Self Care  In-House Referral:     Discharge planning Services  CM Consult  Post Acute Care Choice:    Choice offered to:     DME Arranged:    DME Agency:     HH Arranged:    HH Agency:     Status of Service:  Completed, signed off  If discussed at [employer] of Stay Meetings, dates discussed:    Additional Comments:  Carles Collet, RN 08/18/2016, 11:28 AM

## 2016-08-19 LAB — AEROBIC CULTURE  (SUPERFICIAL SPECIMEN): GRAM STAIN: NONE SEEN

## 2016-08-19 LAB — AEROBIC CULTURE W GRAM STAIN (SUPERFICIAL SPECIMEN)

## 2016-08-22 LAB — CULTURE, BLOOD (ROUTINE X 2)
CULTURE: NO GROWTH
CULTURE: NO GROWTH
Special Requests: ADEQUATE

## 2016-09-11 NOTE — Progress Notes (Signed)
Xray of foot showed chronic osteomyelitis. Podiatry reviewed the xray film and confirmed no Osteomyelitis. So he did not have osteomyelitis. Thanks

## 2016-09-18 ENCOUNTER — Encounter (HOSPITAL_COMMUNITY): Payer: Self-pay | Admitting: Emergency Medicine

## 2016-09-18 ENCOUNTER — Inpatient Hospital Stay (HOSPITAL_COMMUNITY)
Admission: EM | Admit: 2016-09-18 | Discharge: 2016-09-20 | DRG: 617 | Disposition: A | Discharge status: Home Health | Payer: Self-pay | Attending: Family Medicine | Admitting: Family Medicine

## 2016-09-18 ENCOUNTER — Emergency Department (HOSPITAL_COMMUNITY): Payer: Self-pay

## 2016-09-18 DIAGNOSIS — Z9889 Other specified postprocedural states: Secondary | ICD-10-CM

## 2016-09-18 DIAGNOSIS — M899 Disorder of bone, unspecified: Secondary | ICD-10-CM | POA: Diagnosis present

## 2016-09-18 DIAGNOSIS — Z7984 Long term (current) use of oral hypoglycemic drugs: Secondary | ICD-10-CM

## 2016-09-18 DIAGNOSIS — Z833 Family history of diabetes mellitus: Secondary | ICD-10-CM

## 2016-09-18 DIAGNOSIS — D72829 Elevated white blood cell count, unspecified: Secondary | ICD-10-CM | POA: Diagnosis present

## 2016-09-18 DIAGNOSIS — L03116 Cellulitis of left lower limb: Secondary | ICD-10-CM | POA: Diagnosis present

## 2016-09-18 DIAGNOSIS — M86172 Other acute osteomyelitis, left ankle and foot: Secondary | ICD-10-CM | POA: Diagnosis present

## 2016-09-18 DIAGNOSIS — E11621 Type 2 diabetes mellitus with foot ulcer: Secondary | ICD-10-CM | POA: Diagnosis present

## 2016-09-18 DIAGNOSIS — Z8249 Family history of ischemic heart disease and other diseases of the circulatory system: Secondary | ICD-10-CM

## 2016-09-18 DIAGNOSIS — M869 Osteomyelitis, unspecified: Secondary | ICD-10-CM

## 2016-09-18 DIAGNOSIS — I10 Essential (primary) hypertension: Secondary | ICD-10-CM | POA: Diagnosis present

## 2016-09-18 DIAGNOSIS — E119 Type 2 diabetes mellitus without complications: Secondary | ICD-10-CM

## 2016-09-18 DIAGNOSIS — Z89422 Acquired absence of other left toe(s): Secondary | ICD-10-CM

## 2016-09-18 DIAGNOSIS — L97529 Non-pressure chronic ulcer of other part of left foot with unspecified severity: Secondary | ICD-10-CM | POA: Diagnosis present

## 2016-09-18 DIAGNOSIS — E1142 Type 2 diabetes mellitus with diabetic polyneuropathy: Secondary | ICD-10-CM | POA: Diagnosis present

## 2016-09-18 DIAGNOSIS — E1169 Type 2 diabetes mellitus with other specified complication: Principal | ICD-10-CM | POA: Diagnosis present

## 2016-09-18 LAB — URINALYSIS, ROUTINE W REFLEX MICROSCOPIC
BACTERIA UA: NONE SEEN
BILIRUBIN URINE: NEGATIVE
Glucose, UA: NEGATIVE mg/dL
KETONES UR: NEGATIVE mg/dL
Leukocytes, UA: NEGATIVE
Nitrite: NEGATIVE
PROTEIN: 100 mg/dL — AB
SPECIFIC GRAVITY, URINE: 1.019 (ref 1.005–1.030)
pH: 5 (ref 5.0–8.0)

## 2016-09-18 LAB — COMPREHENSIVE METABOLIC PANEL
ALBUMIN: 3.5 g/dL (ref 3.5–5.0)
ALT: 14 U/L — ABNORMAL LOW (ref 17–63)
AST: 18 U/L (ref 15–41)
Alkaline Phosphatase: 55 U/L (ref 38–126)
Anion gap: 8 (ref 5–15)
BUN: 12 mg/dL (ref 6–20)
CHLORIDE: 99 mmol/L — AB (ref 101–111)
CO2: 26 mmol/L (ref 22–32)
Calcium: 8.9 mg/dL (ref 8.9–10.3)
Creatinine, Ser: 1.1 mg/dL (ref 0.61–1.24)
GFR calc Af Amer: >60 mL/min (ref >60)
GLUCOSE: 164 mg/dL — AB (ref 65–99)
POTASSIUM: 4 mmol/L (ref 3.5–5.1)
Sodium: 133 mmol/L — ABNORMAL LOW (ref 135–145)
Total Bilirubin: 0.6 mg/dL (ref 0.3–1.2)
Total Protein: 7.1 g/dL (ref 6.5–8.1)

## 2016-09-18 LAB — I-STAT CG4 LACTIC ACID, ED
LACTIC ACID, VENOUS: 1.47 mmol/L (ref 0.5–1.9)
LACTIC ACID, VENOUS: 1.19 mmol/L (ref 0.5–1.9)

## 2016-09-18 LAB — CBC WITH DIFFERENTIAL/PLATELET
BASOS ABS: 0 10*3/uL (ref 0.0–0.1)
Basophils Relative: 0 %
EOS PCT: 1 %
Eosinophils Absolute: 0.2 10*3/uL (ref 0.0–0.7)
HEMATOCRIT: 36.9 % — AB (ref 39.0–52.0)
Hemoglobin: 12.9 g/dL — ABNORMAL LOW (ref 13.0–17.0)
LYMPHS PCT: 15 %
Lymphs Abs: 1.7 10*3/uL (ref 0.7–4.0)
MCH: 31.2 pg (ref 26.0–34.0)
MCHC: 35 g/dL (ref 30.0–36.0)
MCV: 89.1 fL (ref 78.0–100.0)
Monocytes Absolute: 0.7 10*3/uL (ref 0.1–1.0)
Monocytes Relative: 6 %
NEUTROS ABS: 8.5 10*3/uL — AB (ref 1.7–7.7)
Neutrophils Relative %: 78 %
PLATELETS: 317 10*3/uL (ref 150–400)
RBC: 4.14 MIL/uL — AB (ref 4.22–5.81)
RDW: 12.1 % (ref 11.5–15.5)
WBC: 11.1 10*3/uL — AB (ref 4.0–10.5)

## 2016-09-18 LAB — SEDIMENTATION RATE: Sed Rate: 59 mm/hr — ABNORMAL HIGH (ref 0–16)

## 2016-09-18 LAB — CBG MONITORING, ED: Glucose-Capillary: 152 mg/dL — ABNORMAL HIGH (ref 65–99)

## 2016-09-18 LAB — C-REACTIVE PROTEIN: CRP: 3.7 mg/dL — ABNORMAL HIGH (ref <1.0)

## 2016-09-18 MED ORDER — SODIUM CHLORIDE 0.9 % IV BOLUS (SEPSIS)
2000.0000 mL | Freq: Once | INTRAVENOUS | Status: AC
  Administered 2016-09-18: 2000 mL via INTRAVENOUS

## 2016-09-18 MED ORDER — MORPHINE SULFATE (PF) 4 MG/ML IV SOLN
4.0000 mg | Freq: Once | INTRAVENOUS | Status: AC
  Administered 2016-09-18: 4 mg via INTRAVENOUS
  Filled 2016-09-18: qty 1

## 2016-09-18 MED ORDER — ONDANSETRON HCL 4 MG/2ML IJ SOLN
4.0000 mg | Freq: Once | INTRAMUSCULAR | Status: AC
  Administered 2016-09-18: 4 mg via INTRAVENOUS
  Filled 2016-09-18: qty 2

## 2016-09-18 NOTE — ED Triage Notes (Signed)
Pt states he has been having chills/fever since Sunday. PT has diabetic ulcer on left foot for a month and is concerned it is infected. Serosanguineous fluid from ulcer.

## 2016-09-18 NOTE — ED Notes (Signed)
Patient transported to X-ray 

## 2016-09-18 NOTE — ED Notes (Signed)
Anaerobic culture swab specimen tubed to lab.

## 2016-09-18 NOTE — ED Notes (Signed)
Swab for anaerobic wound culture requested at central supply by secretary.

## 2016-09-18 NOTE — ED Provider Notes (Signed)
[facility] DEPT Provider Note   CSN: [id number] Arrival date & time: 09/18/16  1545    History   Chief Complaint Chief Complaint  Patient presents with  . Foot Ulcer  . Fever    HPI Jorge Higgins is a 41 y.o. male.  41 year old male resents to the emergency department for evaluation of diabetic foot ulcer on his left foot. Symptoms have been present for approximately one month. He was seen one month ago and admitted for questionable osteomyelitis. Patient started on vancomycin and Zosyn. X-ray at that time was reviewed by a podiatrist who did not feel imaging represented acute osteomyelitis. Patient was discharged on a course of Bactrim. Patient reports taking the full course of his antibiotics. He notes worsening to the area despite supportive care. He notes that his sugars have been well-controlled and averaging around 150. He has had some serosanguineous drainage from the area. He notes fevers and chills over the past 48 hours. Pain is worse with weightbearing. No numbness or paresthesias. He has been unable to follow up with podiatry due to lack of insurance.      Past Medical History:  Diagnosis Date  . Diabetes mellitus without complication ([place])   . Hypertension   . Tobacco abuse     Patient Active Problem List   Diagnosis Date Noted  . Chronic osteomyelitis of left foot ([place]) 08/17/2016  . Osteomyelitis of left foot ([place]) 08/17/2016  . Hypertension   . Diabetes mellitus without complication ([place])   . Tobacco abuse     Past Surgical History:  Procedure Laterality Date  . TOE AMPUTATION         Home Medications    Prior to Admission medications   Medication Sig Start Date End Date Taking? Authorizing Provider  amLODipine (NORVASC) 5 MG tablet Take 1 tablet (5 mg total) by mouth daily. 08/19/16  Yes Darrick Meigs, Marge Duncans, MD  glyBURIDE (DIABETA) 5 MG tablet Take 5 mg by mouth 2 (two) times daily with a meal.   Yes [provider]  lisinopril  (PRINIVIL,ZESTRIL) 20 MG tablet Take 1 tablet (20 mg total) by mouth daily. 08/19/16  Yes Oswald Hillock, MD  simvastatin (ZOCOR) 20 MG tablet Take 20 mg by mouth daily. 11/17/15  Yes [provider]  oxyCODONE-acetaminophen (PERCOCET/ROXICET) 5-325 MG tablet Take 1 tablet by mouth every 4 (four) hours as needed for moderate pain. Patient not taking: Reported on 09/18/2016 08/18/16   Oswald Hillock, MD  sulfamethoxazole-trimethoprim (BACTRIM DS,SEPTRA DS) 800-160 MG tablet Take 1 tablet by mouth 2 (two) times daily. Patient not taking: Reported on 09/18/2016 08/18/16   Oswald Hillock, MD    Family History Family History  Problem Relation Age of Onset  . Diabetes Mellitus II Mother   . Hypertension Mother   . Diabetes Mellitus II Sister     Social History Social History  Substance Use Topics  . Smoking status: Current Some Day Smoker  . Smokeless tobacco: Never Used  . Alcohol use Yes     Allergies   Hydromorphone and Nickel   Review of Systems Review of Systems Ten systems reviewed and are negative for acute change, except as noted in the HPI.    Physical Exam Updated Vital Signs BP (!) 153/92 (BP Location: Right Arm)   Pulse 86   Temp 98.7 F (37.1 C) (Oral)   Resp 16   Ht '5\' 6"'  (1.676 m)   Wt 85.3 kg (188 lb)   SpO2 99%  BMI 30.34 kg/m   Physical Exam  Constitutional: He is oriented to person, place, and time. He appears well-developed and well-nourished. No distress.  Mildly diaphoretic, but nontoxic.  HENT:  Head: Normocephalic and atraumatic.  Eyes: Conjunctivae and EOM are normal. No scleral icterus.  Neck: Normal range of motion.  Cardiovascular: Normal rate, regular rhythm and intact distal pulses.   DP pulse 2+ in the LLE. Capillary refill brisk in all digits of the L foot.  Pulmonary/Chest: Effort normal. No respiratory distress.  Respirations even and unlabored  Musculoskeletal: Normal range of motion. He exhibits edema (L foot, non-pitting) and  tenderness.  5th MTP TTP to the L foot. Prior L 4th toe amputation. Diabetic foot ulcer noted. See imaging below.  Neurological: He is alert and oriented to person, place, and time. He exhibits normal muscle tone. Coordination normal.  Sensation to light touch intact. Patient able to wiggle all toes.  Skin: Skin is warm. No rash noted. He is diaphoretic (mild). No erythema. No pallor.  Psychiatric: He has a normal mood and affect. His behavior is normal.  Nursing note and vitals reviewed.        ED Treatments / Results  Labs (all labs ordered are listed, but only abnormal results are displayed) Labs Reviewed  COMPREHENSIVE METABOLIC PANEL - Abnormal; Notable for the following:       Result Value   Sodium 133 (*)    Chloride 99 (*)    Glucose, Bld 164 (*)    ALT 14 (*)    All other components within normal limits  CBC WITH DIFFERENTIAL/PLATELET - Abnormal; Notable for the following:    WBC 11.1 (*)    RBC 4.14 (*)    Hemoglobin 12.9 (*)    HCT 36.9 (*)    Neutro Abs 8.5 (*)    All other components within normal limits  URINALYSIS, ROUTINE W REFLEX MICROSCOPIC - Abnormal; Notable for the following:    Hgb urine dipstick SMALL (*)    Protein, ur 100 (*)    Squamous Epithelial / LPF 0-5 (*)    All other components within normal limits  SEDIMENTATION RATE - Abnormal; Notable for the following:    Sed Rate 59 (*)    All other components within normal limits  C-REACTIVE PROTEIN - Abnormal; Notable for the following:    CRP 3.7 (*)    All other components within normal limits  CBG MONITORING, ED - Abnormal; Notable for the following:    Glucose-Capillary 152 (*)    All other components within normal limits  CULTURE, BLOOD (ROUTINE X 2)  CULTURE, BLOOD (ROUTINE X 2)  AEROBIC/ANAEROBIC CULTURE (SURGICAL/DEEP WOUND)  I-STAT CG4 LACTIC ACID, ED  I-STAT CG4 LACTIC ACID, ED    EKG  EKG Interpretation None       Radiology Dg Foot Complete Left  Result Date:  09/18/2016 CLINICAL DATA:  Diabetic foot ulcer. Wound at the area of fifth metatarsal phalangeal joint. Fever and chills since yesterday. EXAM: LEFT FOOT - COMPLETE 3+ VIEW COMPARISON:  Radiographs 08/18/2016 FINDINGS: Soft tissue ulcer about the lateral foot in the region of the metatarsal phalangeal joint. Associated cortical irregularity and decreased density of the distal fifth metatarsal consistent with osteomyelitis. No radiopaque foreign body. Sequela of 4 3 a transmetatarsal amputation with minimal persistent irregularity of the resection margin and small calcified fragments in the adjacent soft tissues of the surgical bed. There are hammertoe deformity of the second and third digits. No fracture or subluxation. Vascular  calcifications are seen. IMPRESSION: 1. Ulcer about the lateral foot about the 5th metatarsal phalangeal joint with findings suspicious for osteomyelitis of the distal fifth metatarsal head. 2. Post fourth ray transmetatarsal amputation with unchanged irregularity of the resection margin osseous fragmentation, unchanged from prior exam. Electronically Signed   By: Jeb Levering M.D.   On: 09/18/2016 22:24    10:45 PM Case discussed with Dr. Marisue Humble of radiology. She reports that cortical destruction to the 5th metatarsal is new compared to imaging 1 month prior.   Procedures Procedures (including critical care time)  Medications Ordered in ED Medications  morphine 4 MG/ML injection 4 mg (4 mg Intravenous Given 09/18/16 2133)  ondansetron (ZOFRAN) injection 4 mg (4 mg Intravenous Given 09/18/16 2133)  sodium chloride 0.9 % bolus 2,000 mL (0 mLs Intravenous Stopped 09/18/16 2251)     Initial Impression / Assessment and Plan / ED Course  I have reviewed the triage vital signs and the nursing notes.  Pertinent labs & imaging results that were available during my care of the patient were reviewed by me and considered in my medical decision making (see chart for details).       41 year old male presents to the emergency department for worsening foot pain. He has a known diabetic ulcer which she reports has been worsening since his last hospitalization a month ago. Findings during prior hospitalization were not believed to have represented osteomyelitis. There is no evidence of osteomyelitis on x-ray today.  Tachycardia has improved with IV fluids. ESR and CRP noted to be increasing. Patient also has a leukocytosis of 11.1. Broad spectrum antibiotics withheld should plan of care include tailoring of antibiotics to biopsy culture results.  Patient to be admitted for further management and eventual IV abx. Case discussed with Dr. Tamala Julian of [facility] who will evaluate for admission.   Final Clinical Impressions(s) / ED Diagnoses   Final diagnoses:  Osteomyelitis of metatarsal [facility])    New Prescriptions New Prescriptions   No medications on file     Antonietta Breach, Hershal Coria 09/18/16 2330    Gareth Morgan, MD 09/20/16 1335

## 2016-09-19 ENCOUNTER — Inpatient Hospital Stay (HOSPITAL_COMMUNITY): Payer: Self-pay | Admitting: Anesthesiology

## 2016-09-19 ENCOUNTER — Encounter (HOSPITAL_COMMUNITY)
Admission: EM | Disposition: A | Discharge status: Home Health | Payer: Self-pay | Source: Home / Self Care | Attending: Internal Medicine

## 2016-09-19 DIAGNOSIS — E1169 Type 2 diabetes mellitus with other specified complication: Principal | ICD-10-CM

## 2016-09-19 DIAGNOSIS — E11621 Type 2 diabetes mellitus with foot ulcer: Secondary | ICD-10-CM

## 2016-09-19 DIAGNOSIS — M869 Osteomyelitis, unspecified: Secondary | ICD-10-CM | POA: Diagnosis present

## 2016-09-19 DIAGNOSIS — L97509 Non-pressure chronic ulcer of other part of unspecified foot with unspecified severity: Secondary | ICD-10-CM

## 2016-09-19 DIAGNOSIS — L03818 Cellulitis of other sites: Secondary | ICD-10-CM

## 2016-09-19 DIAGNOSIS — M25775 Osteophyte, left foot: Secondary | ICD-10-CM

## 2016-09-19 DIAGNOSIS — M86172 Other acute osteomyelitis, left ankle and foot: Secondary | ICD-10-CM

## 2016-09-19 DIAGNOSIS — D72829 Elevated white blood cell count, unspecified: Secondary | ICD-10-CM | POA: Diagnosis present

## 2016-09-19 DIAGNOSIS — E119 Type 2 diabetes mellitus without complications: Secondary | ICD-10-CM

## 2016-09-19 HISTORY — PX: AMPUTATION: SHX166

## 2016-09-19 LAB — BASIC METABOLIC PANEL
ANION GAP: 6 (ref 5–15)
BUN: 11 mg/dL (ref 6–20)
CO2: 23 mmol/L (ref 22–32)
Calcium: 7.8 mg/dL — ABNORMAL LOW (ref 8.9–10.3)
Chloride: 106 mmol/L (ref 101–111)
Creatinine, Ser: 0.78 mg/dL (ref 0.61–1.24)
GFR calc Af Amer: >60 mL/min (ref >60)
GLUCOSE: 123 mg/dL — AB (ref 65–99)
POTASSIUM: 3.3 mmol/L — AB (ref 3.5–5.1)
Sodium: 135 mmol/L (ref 135–145)

## 2016-09-19 LAB — CBC
HEMATOCRIT: 32.2 % — AB (ref 39.0–52.0)
HEMOGLOBIN: 10.7 g/dL — AB (ref 13.0–17.0)
MCH: 29.8 pg (ref 26.0–34.0)
MCHC: 33.2 g/dL (ref 30.0–36.0)
MCV: 89.7 fL (ref 78.0–100.0)
PLATELETS: 270 10*3/uL (ref 150–400)
RBC: 3.59 MIL/uL — AB (ref 4.22–5.81)
RDW: 12.2 % (ref 11.5–15.5)
WBC: 8.7 10*3/uL (ref 4.0–10.5)

## 2016-09-19 LAB — GLUCOSE, CAPILLARY
GLUCOSE-CAPILLARY: 90 mg/dL (ref 65–99)
Glucose-Capillary: 81 mg/dL (ref 65–99)
Glucose-Capillary: 129 mg/dL — ABNORMAL HIGH (ref 65–99)
Glucose-Capillary: 81 mg/dL (ref 65–99)
Glucose-Capillary: 107 mg/dL — ABNORMAL HIGH (ref 65–99)
Glucose-Capillary: 81 mg/dL (ref 65–99)
Glucose-Capillary: 243 mg/dL — ABNORMAL HIGH (ref 65–99)

## 2016-09-19 LAB — MRSA PCR SCREENING: MRSA BY PCR: NEGATIVE

## 2016-09-19 SURGERY — AMPUTATION, FOOT, RAY
Anesthesia: General | Site: Foot | Laterality: Left

## 2016-09-19 MED ORDER — PHENYLEPHRINE 40 MCG/ML (10ML) SYRINGE FOR IV PUSH (FOR BLOOD PRESSURE SUPPORT)
PREFILLED_SYRINGE | INTRAVENOUS | Status: AC
  Filled 2016-09-19: qty 10

## 2016-09-19 MED ORDER — PNEUMOCOCCAL VAC POLYVALENT 25 MCG/0.5ML IJ INJ
0.5000 mL | INJECTION | INTRAMUSCULAR | Status: AC
  Administered 2016-09-20: 0.5 mL via INTRAMUSCULAR
  Filled 2016-09-19: qty 0.5

## 2016-09-19 MED ORDER — SODIUM CHLORIDE 0.9% FLUSH: 3.0000 mL | INTRAVENOUS | Status: DC | PRN

## 2016-09-19 MED ORDER — PROPOFOL 10 MG/ML IV BOLUS
INTRAVENOUS | Status: DC | PRN
  Administered 2016-09-19: 200 mg via INTRAVENOUS

## 2016-09-19 MED ORDER — LIDOCAINE-EPINEPHRINE 2 %-1:100000 IJ SOLN
INTRAMUSCULAR | Status: DC | PRN
  Administered 2016-09-19: 5 mL

## 2016-09-19 MED ORDER — SODIUM CHLORIDE 0.9 % IV SOLN: 250.0000 mL | INTRAVENOUS | Status: DC | PRN

## 2016-09-19 MED ORDER — PIPERACILLIN-TAZOBACTAM 3.375 G IVPB
3.3750 g | Freq: Three times a day (TID) | INTRAVENOUS | Status: DC
  Administered 2016-09-19 – 2016-09-20 (×5): 3.375 g via INTRAVENOUS
  Filled 2016-09-19 (×7): qty 50

## 2016-09-19 MED ORDER — SIMVASTATIN 20 MG PO TABS: 20.0000 mg | ORAL_TABLET | Freq: Every day | ORAL | Status: DC

## 2016-09-19 MED ORDER — SODIUM CHLORIDE 0.9 % IV SOLN
1.0000 g | Freq: Once | INTRAVENOUS | Status: AC
  Administered 2016-09-19: 1 g via INTRAVENOUS
  Filled 2016-09-19: qty 10

## 2016-09-19 MED ORDER — MIDAZOLAM HCL 5 MG/5ML IJ SOLN
INTRAMUSCULAR | Status: DC | PRN
  Administered 2016-09-19: 2 mg via INTRAVENOUS

## 2016-09-19 MED ORDER — AMLODIPINE BESYLATE 5 MG PO TABS
5.0000 mg | ORAL_TABLET | Freq: Every day | ORAL | Status: DC
  Administered 2016-09-19 – 2016-09-20 (×2): 5 mg via ORAL
  Filled 2016-09-19 (×2): qty 1

## 2016-09-19 MED ORDER — LIDOCAINE 2% (20 MG/ML) 5 ML SYRINGE
INTRAMUSCULAR | Status: DC | PRN
  Administered 2016-09-19: 60 mg via INTRAVENOUS

## 2016-09-19 MED ORDER — FENTANYL CITRATE (PF) 100 MCG/2ML IJ SOLN
INTRAMUSCULAR | Status: DC | PRN
Start: 2016-09-19 — End: 2016-09-19
  Administered 2016-09-19: 50 ug via INTRAVENOUS

## 2016-09-19 MED ORDER — ONDANSETRON HCL 4 MG/2ML IJ SOLN
INTRAMUSCULAR | Status: DC | PRN
  Administered 2016-09-19: 4 mg via INTRAVENOUS

## 2016-09-19 MED ORDER — 0.9 % SODIUM CHLORIDE (POUR BTL) OPTIME
TOPICAL | Status: DC | PRN
Start: 2016-09-19 — End: 2016-09-19
  Administered 2016-09-19: 1000 mL

## 2016-09-19 MED ORDER — PROMETHAZINE HCL 25 MG PO TABS: 12.5000 mg | ORAL_TABLET | ORAL | Status: DC | PRN

## 2016-09-19 MED ORDER — ENOXAPARIN SODIUM 40 MG/0.4ML ~~LOC~~ SOLN
40.0000 mg | SUBCUTANEOUS | Status: DC
  Administered 2016-09-20: 40 mg via SUBCUTANEOUS
  Filled 2016-09-19: qty 0.4

## 2016-09-19 MED ORDER — BUPIVACAINE HCL (PF) 0.25 % IJ SOLN
INTRAMUSCULAR | Status: DC | PRN
  Administered 2016-09-19: 5 mL

## 2016-09-19 MED ORDER — FENTANYL CITRATE (PF) 250 MCG/5ML IJ SOLN
INTRAMUSCULAR | Status: AC
  Filled 2016-09-19: qty 5

## 2016-09-19 MED ORDER — VANCOMYCIN HCL IN DEXTROSE 1-5 GM/200ML-% IV SOLN
1000.0000 mg | Freq: Once | INTRAVENOUS | Status: AC
  Administered 2016-09-19: 1000 mg via INTRAVENOUS
  Filled 2016-09-19: qty 200

## 2016-09-19 MED ORDER — SODIUM CHLORIDE 0.9 % IV SOLN
INTRAVENOUS | Status: DC
  Administered 2016-09-19 (×2): via INTRAVENOUS

## 2016-09-19 MED ORDER — ONDANSETRON HCL 4 MG/2ML IJ SOLN
4.0000 mg | Freq: Four times a day (QID) | INTRAMUSCULAR | Status: DC | PRN
  Administered 2016-09-20: 4 mg via INTRAVENOUS
  Filled 2016-09-19: qty 2

## 2016-09-19 MED ORDER — LIDOCAINE-EPINEPHRINE 2 %-1:100000 IJ SOLN
INTRAMUSCULAR | Status: AC
  Filled 2016-09-19: qty 1

## 2016-09-19 MED ORDER — SODIUM CHLORIDE 0.9% FLUSH
3.0000 mL | Freq: Two times a day (BID) | INTRAVENOUS | Status: DC
  Administered 2016-09-20: 3 mL via INTRAVENOUS

## 2016-09-19 MED ORDER — MIDAZOLAM HCL 2 MG/2ML IJ SOLN
INTRAMUSCULAR | Status: AC
  Filled 2016-09-19: qty 2

## 2016-09-19 MED ORDER — VANCOMYCIN HCL IN DEXTROSE 1-5 GM/200ML-% IV SOLN
1000.0000 mg | Freq: Three times a day (TID) | INTRAVENOUS | Status: DC
  Administered 2016-09-19 – 2016-09-20 (×4): 1000 mg via INTRAVENOUS
  Filled 2016-09-19 (×7): qty 200

## 2016-09-19 MED ORDER — PROPOFOL 10 MG/ML IV BOLUS
INTRAVENOUS | Status: AC
  Filled 2016-09-19: qty 20

## 2016-09-19 MED ORDER — INSULIN ASPART 100 UNIT/ML ~~LOC~~ SOLN
0.0000 [IU] | Freq: Three times a day (TID) | SUBCUTANEOUS | Status: DC
  Administered 2016-09-20 (×2): 1 [IU] via SUBCUTANEOUS

## 2016-09-19 MED ORDER — ONDANSETRON HCL 4 MG/2ML IJ SOLN
INTRAMUSCULAR | Status: AC
  Filled 2016-09-19: qty 2

## 2016-09-19 MED ORDER — OXYCODONE-ACETAMINOPHEN 5-325 MG PO TABS
1.0000 | ORAL_TABLET | ORAL | Status: DC | PRN
  Administered 2016-09-19 – 2016-09-20 (×4): 2 via ORAL
  Filled 2016-09-19 (×5): qty 2

## 2016-09-19 MED ORDER — ALBUTEROL SULFATE (2.5 MG/3ML) 0.083% IN NEBU: 2.5000 mg | INHALATION_SOLUTION | Freq: Four times a day (QID) | RESPIRATORY_TRACT | Status: DC | PRN

## 2016-09-19 MED ORDER — PHENYLEPHRINE HCL 10 MG/ML IJ SOLN
INTRAMUSCULAR | Status: DC | PRN
  Administered 2016-09-19 (×2): 80 ug via INTRAVENOUS
  Administered 2016-09-19 (×2): 120 ug via INTRAVENOUS

## 2016-09-19 MED ORDER — ACETAMINOPHEN 650 MG RE SUPP: 650.0000 mg | Freq: Four times a day (QID) | RECTAL | Status: DC | PRN

## 2016-09-19 MED ORDER — POTASSIUM CHLORIDE CRYS ER 20 MEQ PO TBCR
40.0000 meq | EXTENDED_RELEASE_TABLET | ORAL | Status: AC
  Administered 2016-09-19: 40 meq via ORAL
  Filled 2016-09-19: qty 2

## 2016-09-19 MED ORDER — ACETAMINOPHEN 325 MG PO TABS
650.0000 mg | ORAL_TABLET | Freq: Four times a day (QID) | ORAL | Status: DC | PRN
  Administered 2016-09-19: 650 mg via ORAL
  Filled 2016-09-19: qty 2

## 2016-09-19 MED ORDER — INSULIN ASPART 100 UNIT/ML ~~LOC~~ SOLN
0.0000 [IU] | Freq: Every day | SUBCUTANEOUS | Status: DC
  Administered 2016-09-19: 2 [IU] via SUBCUTANEOUS

## 2016-09-19 MED ORDER — LISINOPRIL 20 MG PO TABS
20.0000 mg | ORAL_TABLET | Freq: Every day | ORAL | Status: DC
  Administered 2016-09-19 – 2016-09-20 (×2): 20 mg via ORAL
  Filled 2016-09-19 (×2): qty 1

## 2016-09-19 MED ORDER — ONDANSETRON HCL 4 MG PO TABS: 4.0000 mg | ORAL_TABLET | Freq: Four times a day (QID) | ORAL | Status: DC | PRN

## 2016-09-19 MED ORDER — PIPERACILLIN-TAZOBACTAM 3.375 G IVPB
3.3750 g | Freq: Once | INTRAVENOUS | Status: AC
  Administered 2016-09-19: 3.375 g via INTRAVENOUS
  Filled 2016-09-19: qty 50

## 2016-09-19 MED ORDER — BUPIVACAINE HCL (PF) 0.25 % IJ SOLN
INTRAMUSCULAR | Status: AC
  Filled 2016-09-19: qty 30

## 2016-09-19 MED ORDER — FENTANYL CITRATE (PF) 100 MCG/2ML IJ SOLN: 25.0000 ug | INTRAMUSCULAR | Status: DC | PRN

## 2016-09-19 SURGICAL SUPPLY — 40 items
BANDAGE ACE 4X5 VEL STRL LF (GAUZE/BANDAGES/DRESSINGS) IMPLANT
BLADE AVERAGE 25X9 (BLADE) ×2 IMPLANT
BLADE SURG 15 STRL LF DISP TIS (BLADE) IMPLANT
BLADE SURG 15 STRL SS (BLADE)
BNDG COHESIVE 4X5 TAN STRL (GAUZE/BANDAGES/DRESSINGS) ×2 IMPLANT
BNDG GAUZE ELAST 4 BULKY (GAUZE/BANDAGES/DRESSINGS) ×2 IMPLANT
CHLORAPREP W/TINT 26ML (MISCELLANEOUS) IMPLANT
COVER SURGICAL LIGHT HANDLE (MISCELLANEOUS) ×2 IMPLANT
CUFF TOURNIQUET SINGLE 18IN (TOURNIQUET CUFF) ×2 IMPLANT
CUFF TOURNIQUET SINGLE 24IN (TOURNIQUET CUFF) IMPLANT
DRSG PAD ABDOMINAL 8X10 ST (GAUZE/BANDAGES/DRESSINGS) ×2 IMPLANT
ELECT REM PT RETURN 9FT ADLT (ELECTROSURGICAL)
ELECTRODE REM PT RTRN 9FT ADLT (ELECTROSURGICAL) IMPLANT
GAUZE SPONGE 4X4 12PLY STRL (GAUZE/BANDAGES/DRESSINGS) ×2 IMPLANT
GAUZE SPONGE 4X4 12PLY STRL LF (GAUZE/BANDAGES/DRESSINGS) ×2 IMPLANT
GAUZE XEROFORM 1X8 LF (GAUZE/BANDAGES/DRESSINGS) ×2 IMPLANT
GLOVE BIO SURGEON STRL SZ8 (GLOVE) ×2 IMPLANT
GLOVE BIOGEL PI IND STRL 8 (GLOVE) ×1 IMPLANT
GLOVE BIOGEL PI INDICATOR 8 (GLOVE) ×1
GOWN STRL REUS W/ TWL LRG LVL3 (GOWN DISPOSABLE) ×2 IMPLANT
GOWN STRL REUS W/TWL LRG LVL3 (GOWN DISPOSABLE) ×2
KIT BASIN OR (CUSTOM PROCEDURE TRAY) ×2 IMPLANT
KIT ROOM TURNOVER OR (KITS) ×2 IMPLANT
NEEDLE 27GAX1X1/2 (NEEDLE) ×2 IMPLANT
NS IRRIG 1000ML POUR BTL (IV SOLUTION) ×2 IMPLANT
PACK ORTHO EXTREMITY (CUSTOM PROCEDURE TRAY) ×2 IMPLANT
PAD ABD 8X10 STRL (GAUZE/BANDAGES/DRESSINGS) ×2 IMPLANT
PAD ARMBOARD 7.5X6 YLW CONV (MISCELLANEOUS) ×4 IMPLANT
PAD CAST 4YDX4 CTTN HI CHSV (CAST SUPPLIES) ×1 IMPLANT
PADDING CAST COTTON 4X4 STRL (CAST SUPPLIES) ×1
SCRUB BETADINE 4OZ XXX (MISCELLANEOUS) ×2 IMPLANT
SOL PREP POV-IOD 4OZ 10% (MISCELLANEOUS) IMPLANT
SUT PROLENE 4 0 PS 2 18 (SUTURE) ×2 IMPLANT
SUT VIC AB 3-0 PS2 18 (SUTURE) ×2 IMPLANT
SUT VICRYL 4-0 PS2 18IN ABS (SUTURE) ×2 IMPLANT
SYR CONTROL 10ML LL (SYRINGE) ×4 IMPLANT
TOWEL OR 17X24 6PK STRL BLUE (TOWEL DISPOSABLE) ×2 IMPLANT
TOWEL OR 17X26 10 PK STRL BLUE (TOWEL DISPOSABLE) ×2 IMPLANT
TUBE CONNECTING 12X1/4 (SUCTIONS) ×2 IMPLANT
YANKAUER SUCT BULB TIP NO VENT (SUCTIONS) IMPLANT

## 2016-09-19 NOTE — Anesthesia Preprocedure Evaluation (Signed)
Anesthesia Evaluation  Patient identified by MRN, date of birth, ID band Patient awake    Reviewed: Allergy & Precautions, H&P , NPO status , Patient's Chart, lab work & pertinent test results  Airway Mallampati: II  TM Distance: >3 FB Neck ROM: Full    Dental no notable dental hx. (+) Teeth Intact, Dental Advisory Given   Pulmonary Current Smoker,    Pulmonary exam normal breath sounds clear to auscultation       Cardiovascular hypertension, Pt. on medications  Rhythm:Regular Rate:Normal     Neuro/Psych negative neurological ROS  negative psych ROS   GI/Hepatic negative GI ROS, Neg liver ROS,   Endo/Other  diabetes, Type 2, Oral Hypoglycemic Agents  Renal/GU negative Renal ROS  negative genitourinary   Musculoskeletal   Abdominal   Peds  Hematology negative hematology ROS (+)   Anesthesia Other Findings   Reproductive/Obstetrics negative OB ROS                             Anesthesia Physical Anesthesia Plan  ASA: II  Anesthesia Plan: General   Post-op Pain Management:    Induction: Intravenous  PONV Risk Score and Plan: 2 and Ondansetron, Propofol and Midazolam  Airway Management Planned: LMA  Additional Equipment:   Intra-op Plan:   Post-operative Plan: Extubation in OR  Informed Consent: I have reviewed the patients History and Physical, chart, labs and discussed the procedure including the risks, benefits and alternatives for the proposed anesthesia with the patient or authorized representative who has indicated his/her understanding and acceptance.   Dental advisory given  Plan Discussed with: CRNA  Anesthesia Plan Comments:         Anesthesia Quick Evaluation

## 2016-09-19 NOTE — Anesthesia Procedure Notes (Signed)
Procedure Name: LMA Insertion Date/Time: 09/19/2016 5:49 PM Performed by: Salli Quarry Bryanda Mikel Pre-anesthesia Checklist: Patient identified, Emergency Drugs available, Suction available and Patient being monitored Patient Re-evaluated:Patient Re-evaluated prior to inductionOxygen Delivery Method: Circle System Utilized Preoxygenation: Pre-oxygenation with 100% oxygen Intubation Type: IV induction LMA: LMA inserted LMA Size: 5.0 Number of attempts: 1 Placement Confirmation: positive ETCO2 Tube secured with: Tape Dental Injury: Teeth and Oropharynx as per pre-operative assessment

## 2016-09-19 NOTE — Brief Op Note (Signed)
09/18/2016 - 09/19/2016  6:42 PM  PATIENT:  Jorge Higgins  41 y.o. male  PRE-OPERATIVE DIAGNOSIS:  Wound  POST-OPERATIVE DIAGNOSIS:  Wound  PROCEDURE:  Procedure(s): PARTIAL 5th RAY AMPUTATION (Left)  SURGEON:  Surgeon(s) and Role:    [name], DPM - Primary  PHYSICIAN ASSISTANT:   ASSISTANTS: none   ANESTHESIA:   local and general  EBL:  Total I/O In: 1180 [P.O.:280; I.V.:900] Out: 500 [Urine:500]  BLOOD ADMINISTERED:none  DRAINS: none   LOCAL MEDICATIONS USED:  MARCAINE    and LIDOCAINE   SPECIMEN:  Source of Specimen:  deep wound culture  DISPOSITION OF SPECIMEN:  PATHOLOGY  COUNTS:  YES  TOURNIQUET:   Total Tourniquet Time Documented: Leg (Left) - 21 minutes Total: Leg (Left) - 21 minutes   DICTATION: .Viviann Spare Dictation  PLAN OF CARE: patient already has active order for inpatient status  PATIENT DISPOSITION:  PACU - hemodynamically stable.   Delay start of Pharmacological VTE agent (>24hrs) due to surgical blood loss or risk of bleeding: not applicable  [name], DPM [facility]  Dr. [name], DPM    [address], [place] [address]                Office [phone]  Fax [phone]

## 2016-09-19 NOTE — H&P (Signed)
History and Physical    Jorge Higgins ATF:[id number] DOB: [birth year] DOA: 09/18/2016  Referring MD/NP/PA: Antonietta Breach PA-C PCP: Lacie Draft, NP  Patient coming from: home  Chief Complaint: Fever  HPI: Jorge Higgins is a 41 y.o. 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 Patient had recently been admitted to the hospital 1 month with a diabetic foot ulcer of the left foot for which patient was initially placed on empiric antibiotics of vancomycin and Zosyn. Patient was evaluated by Dr. Amalia Hailey of podiatry and transitioned to Bactrim for which he completed the full course. Patient notes for approximately 2 weeks there are no signs of infection. Thereafter,  he noted that the foot started using oozing  fluid and he started having pain 2 weeks ago. Since that time pain has worsened in the foot's become more inflamed and swollen. Drainage from the wound has increased and it now has a foul odor. He's had subjective fevers, was unable to sleep last night, and came in for further evaluation. Patient denies having any chest pain, nausea, vomiting, diarrhea, change in vision, or dysuria. He notes that his blood sugars have been better controlled and when he checks them they're around 150.  ED Course:  Upon admission into the emergency department patient was seen to have a temperature of 99.70F, pulse up to 106, respirations of 21, blood pressure 123/70-165/94, and O2 saturations maintained on room air.  Labs revealed WBC 11.1, hemoglobin 12.9, sodium 133, glucose 164, lactic acid 1.14, CRP 3.7, and sedimentation rate 59. Urinalysis was negative for any significant signs of infection. X-rays of the foot show signs of the irregularity suggestive of osteomyelitis.   Review of Systems: As per HPI otherwise 10 point review of systems negative.   Past Medical History:  Diagnosis  Date  . Diabetes mellitus without complication ([place])   . Hypertension   . Tobacco abuse     Past Surgical History:  Procedure Laterality Date  . TOE AMPUTATION       reports that he has been smoking.  He has never used smokeless tobacco. He reports that he drinks alcohol. He reports that he does not use drugs.  Allergies  Allergen Reactions  . Hydromorphone Nausea And Vomiting  . Nickel Rash    Family History  Problem Relation Age of Onset  . Diabetes Mellitus II Mother   . Hypertension Mother   . Diabetes Mellitus II Sister     Prior to Admission medications   Medication Sig Start Date End Date Taking? Authorizing Provider  amLODipine (NORVASC) 5 MG tablet Take 1 tablet (5 mg total) by mouth daily. 08/19/16  Yes Darrick Meigs, Marge Duncans, MD  glyBURIDE (DIABETA) 5 MG tablet Take 5 mg by mouth 2 (two) times daily with a meal.   Yes [provider]  lisinopril (PRINIVIL,ZESTRIL) 20 MG tablet Take 1 tablet (20 mg total) by mouth daily. 08/19/16  Yes Oswald Hillock, MD  simvastatin (ZOCOR) 20 MG tablet Take 20 mg by mouth daily. 11/17/15  Yes [provider]  oxyCODONE-acetaminophen (PERCOCET/ROXICET) 5-325 MG tablet Take 1 tablet by mouth every 4 (four) hours as needed for moderate pain. Patient not taking: Reported on 09/18/2016 08/18/16   Oswald Hillock, MD  sulfamethoxazole-trimethoprim (BACTRIM DS,SEPTRA DS) 800-160 MG tablet Take 1 tablet by mouth 2 (two) times daily. Patient not taking: Reported on 09/18/2016 08/18/16  Oswald Hillock, MD    Physical Exam:    Constitutional: NAD, calm, comfortable Vitals:   09/18/16 2230 09/18/16 2231 09/18/16 2300 09/18/16 2330  BP: (!) 153/92 (!) 153/92 123/70 126/86  Pulse: 88 86 83 84  Resp: '12 16 16 15  ' Temp:  98.7 F (37.1 C)    TempSrc:  Oral    SpO2: 97% 99% 96% 96%  Weight:      Height:       Eyes: PERRL, lids and conjunctivae normal ENMT: Mucous membranes are moist. Posterior pharynx clear of any exudate or  lesions.Normal dentition.  Neck: normal, supple, no masses, no thyromegaly Respiratory: clear to auscultation bilaterally, no wheezing, no crackles. Normal respiratory effort. No accessory muscle use.  Cardiovascular: Regular rate and rhythm, no murmurs / rubs / gallops. No extremity edema. 2+ pedal pulses. No carotid bruits.  Abdomen: no tenderness, no masses palpated. No hepatosplenomegaly. Bowel sounds positive.  Musculoskeletal: no clubbing / cyanosis. Amputation of the fourth digit on the left foot Skin: Lateral aspect of the left foot with a 1 inch ulceration present with purulent foul-smelling drainage    Neurologic: CN 2-12 grossly intact. Sensation abnormal DTR normal. Strength 5/5 in all 4.  Psychiatric: Normal judgment and insight. Alert and oriented x 3. Normal mood.     Labs on Admission: I have personally reviewed following labs and imaging studies  CBC:  Recent Labs Lab 09/18/16 1621  WBC 11.1*  NEUTROABS 8.5*  HGB 12.9*  HCT 36.9*  MCV 89.1  PLT 466   Basic Metabolic Panel:  Recent Labs Lab 09/18/16 1621  NA 133*  K 4.0  CL 99*  CO2 26  GLUCOSE 164*  BUN 12  CREATININE 1.10  CALCIUM 8.9   GFR: Estimated Creatinine Clearance: 89.6 mL/min (by C-G formula based on SCr of 1.1 mg/dL). Liver Function Tests:  Recent Labs Lab 09/18/16 1621  AST 18  ALT 14*  ALKPHOS 55  BILITOT 0.6  PROT 7.1  ALBUMIN 3.5   No results for input(s): LIPASE, AMYLASE in the last 168 hours. No results for input(s): AMMONIA in the last 168 hours. Coagulation Profile: No results for input(s): INR, PROTIME in the last 168 hours. Cardiac Enzymes: No results for input(s): CKTOTAL, CKMB, CKMBINDEX, TROPONINI in the last 168 hours. BNP (last 3 results) No results for input(s): PROBNP in the last 8760 hours. HbA1C: No results for input(s): HGBA1C in the last 72 hours. CBG:  Recent Labs Lab 09/18/16 2056  GLUCAP 152*   Lipid Profile: No results for input(s): CHOL,  HDL, LDLCALC, TRIG, CHOLHDL, LDLDIRECT in the last 72 hours. Thyroid Function Tests: No results for input(s): TSH, T4TOTAL, FREET4, T3FREE, THYROIDAB in the last 72 hours. Anemia Panel: No results for input(s): VITAMINB12, FOLATE, FERRITIN, TIBC, IRON, RETICCTPCT in the last 72 hours. Urine analysis:    Component Value Date/Time   COLORURINE YELLOW 09/18/2016 1920   APPEARANCEUR CLEAR 09/18/2016 1920   LABSPEC 1.019 09/18/2016 1920   PHURINE 5.0 09/18/2016 1920   GLUCOSEU NEGATIVE 09/18/2016 1920   HGBUR SMALL (A) 09/18/2016 1920   BILIRUBINUR NEGATIVE 09/18/2016 1920   KETONESUR NEGATIVE 09/18/2016 1920   PROTEINUR 100 (A) 09/18/2016 1920   NITRITE NEGATIVE 09/18/2016 1920   LEUKOCYTESUR NEGATIVE 09/18/2016 1920   Sepsis Labs: No results found for this or any previous visit (from the past 240 hour(s)).   Radiological Exams on Admission: Dg Foot Complete Left  Result Date: 09/18/2016 CLINICAL DATA:  Diabetic foot ulcer. Wound at the  area of fifth metatarsal phalangeal joint. Fever and chills since yesterday. EXAM: LEFT FOOT - COMPLETE 3+ VIEW COMPARISON:  Radiographs 08/18/2016 FINDINGS: Soft tissue ulcer about the lateral foot in the region of the metatarsal phalangeal joint. Associated cortical irregularity and decreased density of the distal fifth metatarsal consistent with osteomyelitis. No radiopaque foreign body. Sequela of 4 3 a transmetatarsal amputation with minimal persistent irregularity of the resection margin and small calcified fragments in the adjacent soft tissues of the surgical bed. There are hammertoe deformity of the second and third digits. No fracture or subluxation. Vascular calcifications are seen. IMPRESSION: 1. Ulcer about the lateral foot about the 5th metatarsal phalangeal joint with findings suspicious for osteomyelitis of the distal fifth metatarsal head. 2. Post fourth ray transmetatarsal amputation with unchanged irregularity of the resection margin osseous  fragmentation, unchanged from prior exam. Electronically Signed   By: Jeb Levering M.D.   On: 09/18/2016 22:24    Assessment/Plan Diabetic foot ulcer with suspected osteomyelitis: Acute. Patient presents with worsening drainage and swelling of the left foot wound. X-ray imaging showing findings suspicious of osteomyelitis with lab work showing elevated CRP and ESR. - Admit to a Med-surg bed - Follow-up blood and foot cultures - Empiric antibiotics of vancomycin and Zosyn - Dr. Amalia Hailey of podiatry was consulted and will see patient in a.m.   Leukocytosis: Acute. WBC elevated at 11.1. Suspect secondary to above  - Repeat CBC in a.m.  Essential hypertension - Continue amlodipine and lisinopril   Diabetes mellitus type 2: Last hemoglobin A1c was 9.1 on 5/17 - Hypoglycemic protocol - Hold glyburide - CBGs every before meals and at bedtime   DVT prophylaxis: lovenox Code Status: Full Family Communication: No family present at bedside Disposition Plan: TBD  Consults called: Dr. Amalia Hailey podiatry Admission status: Inpatient   Norval Morton MD [facility] Pager [phone]  If 7PM-7AM, please contact night-coverage [email or web address] Password [email or web address]  09/19/2016, 12:02 AM

## 2016-09-19 NOTE — Transfer of Care (Signed)
Immediate Anesthesia Transfer of Care Note  Patient: Jorge Higgins  Procedure(s) Performed: Procedure(s): PARTIAL 5th Jorge AMPUTATION (Left)  Patient Location: PACU  Anesthesia Type:General  Level of Consciousness: awake, alert , oriented and patient cooperative  Airway & Oxygen Therapy: Patient Spontanous Breathing  Post-op Assessment: Report given to RN and Post -op Vital signs reviewed and stable  Post vital signs: Reviewed and stable  Last Vitals:  Vitals:   09/19/16 1439 09/19/16 1837  BP: (!) 153/87 95/63  Pulse: 72 87  Resp: 16 12  Temp: 36.7 C 36.5 C    Last Pain:  Vitals:   09/19/16 1439  TempSrc: Oral  PainSc:          Complications: No apparent anesthesia complications

## 2016-09-19 NOTE — Consult Note (Addendum)
WOC consult requested for foot wound.  X-ray indicates: Associated cortical irregularity and decreased density of the distal fifth metatarsal consistent with osteomyelitis. This complex medical condition is beyond the [facility] scope of practice.  EMR indicates that Dr Amalia Hailey of the podiatry service has been consulted today.  Please refer to him for further plan of care. Please re-consult if further assistance is needed.  Thank-you,  Julien Girt MSN, [place], [place], [place], [place]

## 2016-09-19 NOTE — Anesthesia Postprocedure Evaluation (Signed)
Anesthesia Post Note  Patient: Jorge Higgins  Procedure(s) Performed: Procedure(s) (LRB): PARTIAL 5th RAY AMPUTATION (Left)     Patient location during evaluation: PACU Anesthesia Type: General Level of consciousness: awake, awake and alert and oriented Pain management: pain level controlled Vital Signs Assessment: post-procedure vital signs reviewed and stable Respiratory status: spontaneous breathing, nonlabored ventilation and respiratory function stable Cardiovascular status: blood pressure returned to baseline Anesthetic complications: no    Last Vitals:  Vitals:   09/19/16 1922 09/19/16 1932  BP: (!) 147/93 (!) 150/93  Pulse: 81 81  Resp: 14 12  Temp:      Last Pain:  Vitals:   09/19/16 1837  TempSrc:   PainSc: 0-No pain                 Lewellyn Fultz COKER

## 2016-09-19 NOTE — Progress Notes (Signed)
Pharmacy Antibiotic Note  Jorge Higgins is a 41 y.o. male admitted on 09/18/2016 with L foot osteomyelitis.  Pharmacy has been consulted for Vancomycin and Zosyn dosing. Noted pt presented ~20mos ago with questionable osteo and was d/c home on course of Bactrim.  Plan: Zosyn 3.375gm IV now over 30 min then 3.375gm IV q8h - subsequent doses over 4 hours Vancomycin 1gm Iv q8h Will f/u micro data, renal function, and pt's clinical condition Vanc trough prn   Height: 5\' 6"  (167.6 cm) Weight: 188 lb (85.3 kg) IBW/kg (Calculated) : 63.8  Temp (24hrs), Avg:99.2 F (37.3 C), Min:98.7 F (37.1 C), Max:99.7 F (37.6 C)   Recent Labs Lab 09/18/16 1621 09/18/16 1629 09/18/16 2115  WBC 11.1*  --   --   CREATININE 1.10  --   --   LATICACIDVEN  --  1.47 1.19    Estimated Creatinine Clearance: 89.6 mL/min (by C-G formula based on SCr of 1.1 mg/dL).    Allergies  Allergen Reactions  . Hydromorphone Nausea And Vomiting  . Nickel Rash    Antimicrobials this admission: 6/19 Vanc >>  6/19 Zosyn >>   Dose adjustments this admission: n/a  Microbiology results: 6/18 BCx x2:  6/18 L foot wound cx:  Thank you for allowing pharmacy to be a part of this patient's care.  Sherlon Handing, PharmD, BCPS Clinical pharmacist, pager [phone] 09/19/2016 12:20 AM

## 2016-09-19 NOTE — Interval H&P Note (Signed)
History and Physical Interval Note:  09/19/2016 5:43 PM  Jorge Higgins  has presented today for surgery, with the diagnosis of Wound  The various methods of treatment have been discussed with the patient and family. After consideration of risks, benefits and other options for treatment, the patient has consented to  Procedure(s): PARTIAL 5th RAY AMPUTATION (Left) as a surgical intervention .  The patient's history has been reviewed, patient examined, no change in status, stable for surgery.  I have reviewed the patient's chart and labs.  Questions were answered to the patient's satisfaction.     [name]

## 2016-09-19 NOTE — H&P (View-Only) (Signed)
   PODIATRY CONSULT: Osteomyelitis with overlying ulcer left foot  HPI: 41 year old male with history of diabetes mellitus and partial fourth ray amputation of the left foot presents as inpatient at [facility] for evaluation of a diabetic foot ulcer sub-fifth MTP joint. X-rays taken yesterday in the emergency department are consistent with osteomyelitis and periosteal reaction to the head of the fifth metatarsal. Patient was last seen as an inpatient on 08/17/2016. Patient was instructed to follow-up at the [facility] wound care center or in the office. Patient did not follow up and the ulcer is progressed.  Physical Exam: General: The patient is alert and oriented x3 in no acute distress.  Dermatology: Left plantar forefoot ulceration sub-fifth MTP joint measuring approximately 2.52.50.4cm (LxWxD). The ulcer does probe to bone. To the noted ulceration there is some moderate slough fibrin and necrotic tissue noted. Wound base is pink. There is no eschar. There is a moderate amount of serosanguineous drainage noted.  Vascular: Palpable pedal pulses bilaterally. Moderate edema noted diffusely throughout the foot and ankle left foot compared the contralateral limb  Neurological: Epicritic and protective threshold absent bilaterally.   Musculoskeletal Exam: History of partial fourth ray amputation left foot-unchanged from prior visit  Assessment: 1. Diabetes mellitus with polyneuropathy 2. Ulcer left foot secondary to diabetes mellitus 3. Osteomyelitis fifth metatarsal left foot   Plan of Care:  1. Patient was evaluated. 2. Today discussed with patient the likely require surgical intervention. Patient would function best transmetatarsal amputation however the patient does not want the transmetatarsal amputation at this time. Patient will undergo partial fifth ray amputation with exostectomy of the fourth metatarsal as well. All possible complications and details the procedure were explained.  All patient questions were answered. No guarantees were expressed or implied. 3. NPO after breakfast 4. Surgery tentatively scheduled for today at 6 PM 5. Continue empiric IV antibiotics until cultures and sensitivities are finalized 6. Stressed the importance to follow-up with either [facility] wound care center or myself in the office for follow-up management  Thank you for the consult. Please contact me directly with any questions. [phone].   [name], DPM [facility]  Dr. [name], [place]                                        [place], [place] [address]                Office [phone]  Fax [phone]

## 2016-09-19 NOTE — Progress Notes (Signed)
Patient seen and examined. Admitted after midnight secondary to acute on chronic left diabetic foot ulcer with osteomyelitis of the fifth metatarsal. Patient is hemodynamically stable and not toxic in appearance. Endorses no having that much pain on his foot secondary to underlying neuropathy. Podiatry service (Dr. Amalia Hailey) has a reported patient and is planning surgical intervention around 6 PM on 09/19/2016. Please refer to H&P written by Dr. Tamala Julian for further info/details on admission.  Plan: -Continue IV antibiotics -Continue supportive care and as needed analgesics -Follow culture/sensitivity data -Follow clinical response and surgical outcome -Continue good control of his diabetes  Barton Dubois [phone]

## 2016-09-19 NOTE — Consult Note (Signed)
   PODIATRY CONSULT: Osteomyelitis with overlying ulcer left foot  HPI: 41 year old male with history of diabetes mellitus and partial fourth ray amputation of the left foot presents as inpatient at [facility] for evaluation of a diabetic foot ulcer sub-fifth MTP joint. X-rays taken yesterday in the emergency department are consistent with osteomyelitis and periosteal reaction to the head of the fifth metatarsal. Patient was last seen as an inpatient on 08/17/2016. Patient was instructed to follow-up at the [facility] wound care center or in the office. Patient did not follow up and the ulcer is progressed.  Physical Exam: General: The patient is alert and oriented x3 in no acute distress.  Dermatology: Left plantar forefoot ulceration sub-fifth MTP joint measuring approximately 2.52.50.4cm (LxWxD). The ulcer does probe to bone. To the noted ulceration there is some moderate slough fibrin and necrotic tissue noted. Wound base is pink. There is no eschar. There is a moderate amount of serosanguineous drainage noted.  Vascular: Palpable pedal pulses bilaterally. Moderate edema noted diffusely throughout the foot and ankle left foot compared the contralateral limb  Neurological: Epicritic and protective threshold absent bilaterally.   Musculoskeletal Exam: History of partial fourth ray amputation left foot-unchanged from prior visit  Assessment: 1. Diabetes mellitus with polyneuropathy 2. Ulcer left foot secondary to diabetes mellitus 3. Osteomyelitis fifth metatarsal left foot   Plan of Care:  1. Patient was evaluated. 2. Today discussed with patient the likely require surgical intervention. Patient would function best transmetatarsal amputation however the patient does not want the transmetatarsal amputation at this time. Patient will undergo partial fifth ray amputation with exostectomy of the fourth metatarsal as well. All possible complications and details the procedure were explained.  All patient questions were answered. No guarantees were expressed or implied. 3. NPO after breakfast 4. Surgery tentatively scheduled for today at 6 PM 5. Continue empiric IV antibiotics until cultures and sensitivities are finalized 6. Stressed the importance to follow-up with either [facility] care center or myself in the office for follow-up management  Thank you for the consult. Please contact me directly with any questions. [phone].   [name], DPM [facility]  Dr. [name], [place]                                        [place], [place] [address]                Office [phone]  Fax [phone]

## 2016-09-20 ENCOUNTER — Encounter (HOSPITAL_COMMUNITY): Payer: Self-pay | Admitting: Podiatry

## 2016-09-20 ENCOUNTER — Inpatient Hospital Stay (HOSPITAL_COMMUNITY): Payer: Self-pay

## 2016-09-20 DIAGNOSIS — I10 Essential (primary) hypertension: Secondary | ICD-10-CM

## 2016-09-20 DIAGNOSIS — M869 Osteomyelitis, unspecified: Secondary | ICD-10-CM

## 2016-09-20 DIAGNOSIS — D72829 Elevated white blood cell count, unspecified: Secondary | ICD-10-CM

## 2016-09-20 LAB — CBC
HCT: 32.6 % — ABNORMAL LOW (ref 39.0–52.0)
Hemoglobin: 11 g/dL — ABNORMAL LOW (ref 13.0–17.0)
MCH: 31 pg (ref 26.0–34.0)
MCHC: 33.7 g/dL (ref 30.0–36.0)
MCV: 91.8 fL (ref 78.0–100.0)
PLATELETS: 254 10*3/uL (ref 150–400)
RBC: 3.55 MIL/uL — ABNORMAL LOW (ref 4.22–5.81)
RDW: 12.6 % (ref 11.5–15.5)
WBC: 8.9 10*3/uL (ref 4.0–10.5)

## 2016-09-20 LAB — GLUCOSE, CAPILLARY
GLUCOSE-CAPILLARY: 121 mg/dL — AB (ref 65–99)
GLUCOSE-CAPILLARY: 150 mg/dL — AB (ref 65–99)

## 2016-09-20 LAB — BASIC METABOLIC PANEL
ANION GAP: 5 (ref 5–15)
BUN: 9 mg/dL (ref 6–20)
CALCIUM: 8 mg/dL — AB (ref 8.9–10.3)
CO2: 25 mmol/L (ref 22–32)
Chloride: 106 mmol/L (ref 101–111)
Creatinine, Ser: 0.87 mg/dL (ref 0.61–1.24)
GFR calc Af Amer: >60 mL/min (ref >60)
GLUCOSE: 116 mg/dL — AB (ref 65–99)
Potassium: 4.1 mmol/L (ref 3.5–5.1)
Sodium: 136 mmol/L (ref 135–145)

## 2016-09-20 LAB — MAGNESIUM: Magnesium: 2 mg/dL (ref 1.7–2.4)

## 2016-09-20 MED ORDER — AMOXICILLIN-POT CLAVULANATE 875-125 MG PO TABS: 1.0000 | ORAL_TABLET | Freq: Two times a day (BID) | ORAL | 0 refills | Status: AC

## 2016-09-20 MED ORDER — OXYCODONE-ACETAMINOPHEN 5-325 MG PO TABS: 1.0000 | ORAL_TABLET | Freq: Four times a day (QID) | ORAL | 0 refills | Status: DC | PRN

## 2016-09-20 NOTE — Progress Notes (Signed)
Orthopedic Tech Progress Note Patient Details:  Jorge Higgins Jan 03, 1976 [id number]  Ortho Devices Type of Ortho Device: Postop shoe/boot Ortho Device/Splint Location: lle Ortho Device/Splint Interventions: Application   Brahm Barbeau 09/20/2016, 8:47 AM

## 2016-09-20 NOTE — Discharge Instructions (Signed)
The patient can likely be discharged on oral antibiotics based on cultures and sensitivities taken in the emergency department upon admission. The skin incision site as well as soft tissue structures of the foot appeared healthy prior to primary closure. Nonweightbearing left lower extremity using crutches. Follow up in the office in 1 week. Keep dressings clean dry and intact until instructed otherwise   Walk    [employer] brazos y piernas como si caminara, adelantando el brazo opuesto a la pierna, con el cuerpo inclinado levemente hacia adelante. Camine ____ minutos/sesin. [place]aga ____ sesiones/semana.  Copyright  VHI. All rights reserved.

## 2016-09-20 NOTE — Progress Notes (Signed)
Pt being discharged home via wheelchair with family. Pt alert and oriented x4. VSS. Pt c/o no pain at this time. No signs of respiratory distress. Education complete and care plans resolved. IV removed with catheter intact and pt tolerated well. No further issues at this time. 3 in 1 in room and crutches already delivered to pt. Pt to follow up with PCP. Leanne Chang, RN

## 2016-09-20 NOTE — Op Note (Signed)
OPERATIVE REPORT Patient name: Jorge Higgins MRN: [id number] DOB: 01/15/1976  DOS:  09/19/2016  Preop Dx: Osteomyelitis fifth metatarsal left foot. Exostosis fourth metatarsal left foot. Abscess with cellulitis left foot.  Postop Dx: same  Procedure:  1. Partial fifth ray amputation left foot 2. Exostectomy fourth metatarsal amputation stump left foot 3. Incision and drainage left foot  Surgeon: [name] DPM  Anesthesia: 50-50 mixture of 2% lidocaine plain with 0.5% Marcaine plain totaling 10 mL infiltrated in the patient's left lower extremity  Hemostasis: Ankle tourniquet inflated to a pressure of 279mmHg after esmarch exsanguination   EBL: 10 mL Materials: None Injectables: None Pathology: Deep wound cultures sent for cultures and sensitivities, anaerobic/aerobic/Gram stain  Condition: The patient tolerated the procedure and anesthesia well. No complications noted or reported   Justification for procedure: The patient is a 41 y.o. male with a history of uncontrolled diabetes mellitus and ulcers who presents today for surgical correction of development of osteomyelitis to the head of the fifth metatarsal left foot as well as abscess and cellulitis left foot. Patient has a history of partial fourth ray amputation to the left foot as well.   Patient was last seen as an inpatient approximately one month prior for evaluation of an ulcer with cellulitis. The patient never followed up in the office or at the [facility] wound care center. The patient consented for surgical correction. The patient consent form was reviewed. All patient questions were answered. No guarantees were expressed or implied. The patient and the surgeon boson the patient consent form with the witness present and placed in the patient's chart.   Procedure in Detail: The patient was brought to the operating room, placed in the operating table in the supine position at which time an aseptic scrub and  drape were performed about the patient's respective lower extremity after anesthesia was induced as described above. Attention was then directed to the surgical area where procedure number one commenced.  Procedure #1: Partial fifth ray amputation left foot A racquet type incision was planned and made about the fifth metatarsal phalangeal joint circumscribing the underlying ulcer to the fifth MTP joint. Incision was carried down to the level of bone with care taken to cut clamped ligated or retracted way all small neurovascular structures traversing the incision site. The fifth digit was grasped with a bone forcep and all soft tissue surrounding the fifth metatarsal phalangeal joint were sharply dissected and the toes removed in toto and placing sterile specimen container. At this time additional soft tissue structures surrounding the distal one half of the fifth metatarsal were reflected away and a sagittal blade mounted on a sagittal saw was utilized to create an osteotomy at the mid shaft diaphysis of the fifth metatarsal. The orientation of the osteotomy was in a dorsal distal to proximal plantar orientation. The distal half of the fifth metatarsal bone was removed in toto and placing sterile specimen container.  Procedure #2: Exostectomy fourth metatarsal amputation stump left foot Attention was then directed to the fourth metatarsal of the left foot where on radiographic exam there was a exostosis development at the distal stump of the fourth metatarsal. This portion of bone was visualized within the incision site and careful dissection was utilized to free all surrounding soft tissue from the fourth metatarsal. At this time an osteotomy was performed using the same sagittal blade of the distal one half of the fourth metatarsal. The orientation of the osteotomy was made dorsal distal to  proximal plantar orientation. The distal portion of the fourth metatarsal was removed in toto and placing sterile  specimen container.  Procedure #3: Incision and drainage left foot Copious irrigation with normal saline was utilized to the amputation site. All necrotic nonviable tissue was sharply dissected using a #15 blade. Blunt dissection was utilized to ensure that there were no underlying additional abscesses or pockets of infection within the soft tissue structures of the foot. Copious irrigation was again utilized and the foot was dried in preparation for routine layered soft tissue closure  Superficial skin staples were utilized to reapproximate superficial skin edges. Dry sterile compressive dressings were then applied to all previously mentioned incision sites about the patient's left lower extremity. The left ankle tourniquet which was used for hemostasis was deflated. All normal neurovascular responses including pink color and warmth returned all the digits of the patient's left lower extremity.   The patient was then transferred from the operating room to the recovery room having tolerated the procedure and anesthesia well. All vital signs are stable. After a brief stay in the recovery room the patient was readmitted to his inpatient room with adequate prescriptions for analgesia. Verbal as well as written instructions were provided for the patient regarding wound care. The patient is to keep the dressings clean dry and intact until they are to follow surgeon Dr. Daylene Katayama in the office upon discharge.  Postoperative Impression: The patient can likely be discharged on oral antibiotics based on cultures and sensitivities taken in the emergency department upon admission. The skin incision site as well as soft tissue structures of the foot appeared healthy prior to primary closure. Nonweightbearing left lower extremity using crutches. Follow up in the office in 1 week. Keep dressings clean dry and intact until instructed otherwise  [name], DPM [facility]  Dr. [name], [address]                                        [place], [place] [address]                Office [phone]  Fax [phone]

## 2016-09-20 NOTE — Care Management (Signed)
Case manager ordered 3in1 for patient.

## 2016-09-20 NOTE — Discharge Summary (Signed)
Physician Discharge Summary  Jorge Higgins ENI:[id number] DOB: February 22, 1976 DOA: 09/18/2016  PCP: Lacie Draft, NP Surgeon: Dr. Amalia Hailey Admit date: 09/18/2016 Discharge date: 09/20/2016  Admitted From: Home  Disposition: Home  Recommendations for Outpatient Follow-up:  1. Follow up with surgeon in 1 weeks 2. Follow up with PCP in 1-2 weeks 3. Please obtain BMP/CBC in one week 4. Please follow up on the following pending results: final culture results  Discharge Condition: STABLE   CODE STATUS: FULL   Diet recommendation: Heart carb modified  Brief/Interim Summary: HPI: Jorge Higgins is a 41 y.o. 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 Patient had recently been admitted to the hospital 1 month with a diabetic foot ulcer of the left foot for which patient was initially placed on empiric antibiotics of vancomycin and Zosyn. Patient was evaluated by Dr. Amalia Hailey of podiatry and transitioned to Bactrim for which he completed the full course. Patient notes for approximately 2 weeks there are no signs of infection. Thereafter,  he noted that the foot started using oozing  fluid and he started having pain 2 weeks ago. Since that time pain has worsened in the foot's become more inflamed and swollen. Drainage from the wound has increased and it now has a foul odor. He's had subjective fevers, was unable to sleep last night, and came in for further evaluation. Patient denies having any chest pain, nausea, vomiting, diarrhea, change in vision, or dysuria. He notes that his blood sugars have been better controlled and when he checks them they're around 150.  ED Course:  Upon admission into the emergency department patient was seen to have a temperature of 99.41F, pulse up to 106, respirations of 21, blood pressure 123/70-165/94, and O2 saturations maintained on room air.  Labs  revealed WBC 11.1, hemoglobin 12.9, sodium 133, glucose 164, lactic acid 1.14, CRP 3.7, and sedimentation rate 59. Urinalysis was negative for any significant signs of infection. X-rays of the foot show signs of the irregularity suggestive of osteomyelitis.   Diabetic foot ulcer with suspected osteomyelitis: Acute. Patient presents with worsening drainage and swelling of the left foot wound. X-ray imaging showing findings suspicious of osteomyelitis with lab work showing elevated CRP and ESR. - Admitted to a Med-surg bed - Follow-up blood and foot cultures, no growth to date - Empiric antibiotics of vancomycin and Zosyn, discharge home on oral augmentin x 7 days - Dr. Amalia Hailey of podiatry was consulted and took to OR for  Procedure: 09/19/16 1. Partial fifth ray amputation left foot 2. Exostectomy fourth metatarsal amputation stump left foot 3. Incision and drainage left foot  Pt to follow up with Dr. Amalia Hailey in 1 week in office, Nonweightbearing left lower extremity using crutches. Follow up in the office in 1 week. Keep dressings clean dry and intact until instructed otherwise.  I ordered for home health services with RN to help with dressing changes, etc at home, PT at home.  I consulted care manager to assist.   Leukocytosis: Acute. WBC elevated at 11.1. Suspect secondary to above  - Repeat WBC 8.9 on day of discharge  Essential hypertension - Continue amlodipine and lisinopril   Diabetes mellitus type 2: Last hemoglobin A1c was 9.1 on 5/17 - Hypoglycemic protocol - Hold glyburide - CBGs every before meals and at bedtime   CBG (last 3)   Recent Labs  09/19/16 2207  09/20/16 0624 09/20/16 1200  GLUCAP 243* 121* 150*   DVT prophylaxis: lovenox Code Status: Full Family Communication: wife present at bedside Disposition Plan: TBD  Consults called: Dr. Amalia Hailey podiatry Admission status: Inpatient   Discharge Diagnoses:  Principal Problem:   Osteomyelitis [facility]) Active Problems:    Hypertension   Diabetes mellitus without complication ([place])   Leukocytosis  Discharge Instructions  Discharge Instructions    Increase activity slowly    Complete by:  As directed      Allergies as of 09/20/2016      Reactions   Hydromorphone Nausea And Vomiting   Nickel Rash      Medication List    STOP taking these medications   sulfamethoxazole-trimethoprim 800-160 MG tablet Commonly known as:  BACTRIM DS,SEPTRA DS     TAKE these medications   amLODipine 5 MG tablet Commonly known as:  NORVASC Take 1 tablet (5 mg total) by mouth daily.   amoxicillin-clavulanate 875-125 MG tablet Commonly known as:  AUGMENTIN Take 1 tablet by mouth 2 (two) times daily.   glyBURIDE 5 MG tablet Commonly known as:  DIABETA Take 5 mg by mouth 2 (two) times daily with a meal.   lisinopril 20 MG tablet Commonly known as:  PRINIVIL,ZESTRIL Take 1 tablet (20 mg total) by mouth daily.   oxyCODONE-acetaminophen 5-325 MG tablet Commonly known as:  PERCOCET/ROXICET Take 1 tablet by mouth every 6 (six) hours as needed for severe pain. What changed:  when to take this  reasons to take this   simvastatin 20 MG tablet Commonly known as:  ZOCOR Take 20 mg by mouth daily.      Follow-up Information    Lacie Draft, NP. Schedule an appointment as soon as possible for a visit in 1 week(s).   Specialty:  Family Medicine Contact information: [address] [place] [address] [phone]        [name], DPM. Schedule an appointment as soon as possible for a visit in 1 week(s).   Specialty:  Podiatry Why:  Hospital Follow Up  Contact information: [address] [place] [address] [place] [address] [phone]          Allergies  Allergen Reactions  . Hydromorphone Nausea And Vomiting  . Nickel Rash     Procedures/Studies: Dg Foot Complete Left  Result Date: 09/20/2016 CLINICAL DATA:  Amputation of the left little toe and  distal fifth metatarsal. Revision of amputation of the distal fourth metatarsal. EXAM: LEFT FOOT - COMPLETE 3+ VIEW COMPARISON:  Radiographs dated 09/18/2016 FINDINGS: Postoperative images demonstrate the patient has undergone amputation of the distal fifth metatarsal and fifth toe with revision of the amputation of the mid fourth metatarsal. Expected postoperative air in the soft tissues. Surgical staples in place. The other bones of the foot appear normal. IMPRESSION: Satisfactory appearance of the left foot after amputations as described. Electronically Signed   By: Lorriane Shire M.D.   On: 09/20/2016 10:46   Dg Foot Complete Left  Result Date: 09/18/2016 CLINICAL DATA:  Diabetic foot ulcer. Wound at the area of fifth metatarsal phalangeal joint. Fever and chills since yesterday. EXAM: LEFT FOOT - COMPLETE 3+ VIEW COMPARISON:  Radiographs 08/18/2016 FINDINGS: Soft tissue ulcer about the lateral foot in the region of the metatarsal phalangeal joint. Associated cortical irregularity and decreased density of the distal fifth metatarsal consistent with osteomyelitis. No radiopaque foreign body. Sequela of 4 3 a transmetatarsal amputation with minimal persistent  irregularity of the resection margin and small calcified fragments in the adjacent soft tissues of the surgical bed. There are hammertoe deformity of the second and third digits. No fracture or subluxation. Vascular calcifications are seen. IMPRESSION: 1. Ulcer about the lateral foot about the 5th metatarsal phalangeal joint with findings suspicious for osteomyelitis of the distal fifth metatarsal head. 2. Post fourth ray transmetatarsal amputation with unchanged irregularity of the resection margin osseous fragmentation, unchanged from prior exam. Electronically Signed   By: Jeb Levering M.D.   On: 09/18/2016 22:24      Subjective: Pt reporting that he has pain but has been able to ambulate some, also says that he has crutches at home.     Discharge Exam: Vitals:   09/20/16 0628 09/20/16 1055  BP: 128/80   Pulse: 80   Resp: 18   Temp: 98 F (36.7 C) 98.5 F (36.9 C)   Vitals:   09/19/16 1946 09/20/16 0025 09/20/16 0628 09/20/16 1055  BP: (!) 155/98 134/78 128/80   Pulse: 80 79 80   Resp: '14 18 18   ' Temp: 97.8 F (36.6 C) 98 F (36.7 C) 98 F (36.7 C) 98.5 F (36.9 C)  TempSrc: Oral Oral Oral   SpO2: 97% 98% 99%   Weight:      Height:       General: Pt is alert, awake, not in acute distress Cardiovascular: RRR, S1/S2 +, no rubs, no gallops Respiratory: CTA bilaterally, no wheezing, no rhonchi Abdominal: Soft, NT, ND, bowel sounds + Extremities: wound clean, dry, intact.   The results of significant diagnostics from this hospitalization (including imaging, microbiology, ancillary and laboratory) are listed below for reference.     Microbiology: Recent Results (from the past 240 hour(s))  Aerobic/Anaerobic Culture (surgical/deep wound)     Status: Abnormal (Preliminary result)   Collection Time: 09/18/16  9:47 PM  Result Value Ref Range Status   Specimen Description WOUND LEFT FOOT  Final   Special Requests NONE  Final   Gram Stain   Final    RARE WBC PRESENT, PREDOMINANTLY PMN FEW GRAM POSITIVE COCCI IN PAIRS IN CLUSTERS FEW GRAM NEGATIVE COCCOBACILLI RARE GRAM NEGATIVE RODS    Culture MULTIPLE ORGANISMS PRESENT, NONE PREDOMINANT (A)  Final   Report Status PENDING  Incomplete  Culture, blood (Routine X 2) w Reflex to ID Panel     Status: None (Preliminary result)   Collection Time: 09/18/16 10:25 PM  Result Value Ref Range Status   Specimen Description BLOOD RIGHT ANTECUBITAL  Final   Special Requests   Final    BOTTLES DRAWN AEROBIC AND ANAEROBIC Blood Culture adequate volume   Culture NO GROWTH < 24 HOURS  Final   Report Status PENDING  Incomplete  Culture, blood (Routine X 2) w Reflex to ID Panel     Status: None (Preliminary result)   Collection Time: 09/18/16 10:25 PM  Result Value Ref  Range Status   Specimen Description BLOOD LEFT HAND  Final   Special Requests   Final    BOTTLES DRAWN AEROBIC AND ANAEROBIC Blood Culture adequate volume   Culture NO GROWTH < 24 HOURS  Final   Report Status PENDING  Incomplete  MRSA PCR Screening     Status: None   Collection Time: 09/19/16  9:29 AM  Result Value Ref Range Status   MRSA by PCR NEGATIVE NEGATIVE Final    Comment:        The GeneXpert MRSA Assay (FDA approved for NASAL specimens  only), is one component of a comprehensive MRSA colonization surveillance program. It is not intended to diagnose MRSA infection nor to guide or monitor treatment for MRSA infections.   Aerobic/Anaerobic Culture (surgical/deep wound)     Status: None (Preliminary result)   Collection Time: 09/19/16  6:11 PM  Result Value Ref Range Status   Specimen Description ABSCESS FOOT LEFT  Final   Special Requests NONE  Final   Gram Stain   Final    RARE WBC PRESENT,BOTH PMN AND MONONUCLEAR RARE GRAM POSITIVE COCCI IN SINGLES    Culture CULTURE REINCUBATED FOR BETTER GROWTH  Final   Report Status PENDING  Incomplete     Labs: BNP (last 3 results) No results for input(s): BNP in the last 8760 hours. Basic Metabolic Panel:  Recent Labs Lab 09/18/16 1621 09/19/16 0317 09/20/16 0606  NA 133* 135 136  K 4.0 3.3* 4.1  CL 99* 106 106  CO2 '26 23 25  ' GLUCOSE 164* 123* 116*  BUN '12 11 9  ' CREATININE 1.10 0.78 0.87  CALCIUM 8.9 7.8* 8.0*  MG  --   --  2.0   Liver Function Tests:  Recent Labs Lab 09/18/16 1621  AST 18  ALT 14*  ALKPHOS 55  BILITOT 0.6  PROT 7.1  ALBUMIN 3.5   No results for input(s): LIPASE, AMYLASE in the last 168 hours. No results for input(s): AMMONIA in the last 168 hours. CBC:  Recent Labs Lab 09/18/16 1621 09/19/16 0317 09/20/16 0606  WBC 11.1* 8.7 8.9  NEUTROABS 8.5*  --   --   HGB 12.9* 10.7* 11.0*  HCT 36.9* 32.2* 32.6*  MCV 89.1 89.7 91.8  PLT 317 270 254   Cardiac Enzymes: No results for  input(s): CKTOTAL, CKMB, CKMBINDEX, TROPONINI in the last 168 hours. BNP: Invalid input(s): POCBNP CBG:  Recent Labs Lab 09/19/16 1630 09/19/16 1838 09/19/16 2207 09/20/16 0624 09/20/16 1200  GLUCAP 107* 81 243* 121* 150*   D-Dimer No results for input(s): DDIMER in the last 72 hours. Hgb A1c No results for input(s): HGBA1C in the last 72 hours. Lipid Profile No results for input(s): CHOL, HDL, LDLCALC, TRIG, CHOLHDL, LDLDIRECT in the last 72 hours. Thyroid function studies No results for input(s): TSH, T4TOTAL, T3FREE, THYROIDAB in the last 72 hours.  Invalid input(s): FREET3 Anemia work up No results for input(s): VITAMINB12, FOLATE, FERRITIN, TIBC, IRON, RETICCTPCT in the last 72 hours. Urinalysis    Component Value Date/Time   COLORURINE YELLOW 09/18/2016 1920   APPEARANCEUR CLEAR 09/18/2016 1920   LABSPEC 1.019 09/18/2016 1920   PHURINE 5.0 09/18/2016 1920   GLUCOSEU NEGATIVE 09/18/2016 1920   HGBUR SMALL (A) 09/18/2016 1920   BILIRUBINUR NEGATIVE 09/18/2016 1920   KETONESUR NEGATIVE 09/18/2016 1920   PROTEINUR 100 (A) 09/18/2016 1920   NITRITE NEGATIVE 09/18/2016 1920   LEUKOCYTESUR NEGATIVE 09/18/2016 1920   Sepsis Labs Invalid input(s): PROCALCITONIN,  WBC,  LACTICIDVEN Microbiology Recent Results (from the past 240 hour(s))  Aerobic/Anaerobic Culture (surgical/deep wound)     Status: Abnormal (Preliminary result)   Collection Time: 09/18/16  9:47 PM  Result Value Ref Range Status   Specimen Description WOUND LEFT FOOT  Final   Special Requests NONE  Final   Gram Stain   Final    RARE WBC PRESENT, PREDOMINANTLY PMN FEW GRAM POSITIVE COCCI IN PAIRS IN CLUSTERS FEW GRAM NEGATIVE COCCOBACILLI RARE GRAM NEGATIVE RODS    Culture MULTIPLE ORGANISMS PRESENT, NONE PREDOMINANT (A)  Final   Report Status PENDING  Incomplete  Culture, blood (Routine X 2) w Reflex to ID Panel     Status: None (Preliminary result)   Collection Time: 09/18/16 10:25 PM  Result  Value Ref Range Status   Specimen Description BLOOD RIGHT ANTECUBITAL  Final   Special Requests   Final    BOTTLES DRAWN AEROBIC AND ANAEROBIC Blood Culture adequate volume   Culture NO GROWTH < 24 HOURS  Final   Report Status PENDING  Incomplete  Culture, blood (Routine X 2) w Reflex to ID Panel     Status: None (Preliminary result)   Collection Time: 09/18/16 10:25 PM  Result Value Ref Range Status   Specimen Description BLOOD LEFT HAND  Final   Special Requests   Final    BOTTLES DRAWN AEROBIC AND ANAEROBIC Blood Culture adequate volume   Culture NO GROWTH < 24 HOURS  Final   Report Status PENDING  Incomplete  MRSA PCR Screening     Status: None   Collection Time: 09/19/16  9:29 AM  Result Value Ref Range Status   MRSA by PCR NEGATIVE NEGATIVE Final    Comment:        The GeneXpert MRSA Assay (FDA approved for NASAL specimens only), is one component of a comprehensive MRSA colonization surveillance program. It is not intended to diagnose MRSA infection nor to guide or monitor treatment for MRSA infections.   Aerobic/Anaerobic Culture (surgical/deep wound)     Status: None (Preliminary result)   Collection Time: 09/19/16  6:11 PM  Result Value Ref Range Status   Specimen Description ABSCESS FOOT LEFT  Final   Special Requests NONE  Final   Gram Stain   Final    RARE WBC PRESENT,BOTH PMN AND MONONUCLEAR RARE GRAM POSITIVE COCCI IN SINGLES    Culture CULTURE REINCUBATED FOR BETTER GROWTH  Final   Report Status PENDING  Incomplete    Time coordinating discharge: 34 mins  SIGNED:  Irwin Brakeman, MD  [facility] 09/20/2016, 3:12 PM Pager [phone]  If 7PM-7AM, please contact night-coverage [email or web address] Password [email or web address]

## 2016-09-20 NOTE — Evaluation (Signed)
Physical Therapy Evaluation Patient Details Name: Jorge Higgins MRN: [id number] DOB: 03-13-1976 Today's Date: 09/20/2016   History of Present Illness  Pt is 41 y/o male admitted secondary to osteomyelitis of L foot s/p L partial 5th ray amputation, exostectomy of 4th metatarsal amputation, and L foot incision and drainage. PMH includes DM, HTN, and tobacco abuse.   Clinical Impression  Pt s/p surgery above with deficits below. PTA, pt was independent with functional mobility. Upon evaluation, pt limited by post op pain and weakness, as well as, decreased balance, and initial difficulty with maintaining NWB on LLE. Educated about importance of maintaining NWB on LLE, and difficulty with maintaining during sit<>Stand transfer. Practiced X2, however, refused further practiced, so demonstrated appropriate technique with crutches. Practiced gait with RW and with crutches. Pt easily fatigued with use of RW, and with improved technique when using crutches, therefore use of crutches recommended at home. Min guard to supervision required for mobility. Educated to have 24/7 initially and pt agreeable. Recommending 3 in 1 for home use to increase independence and safety with ADLS. No follow up PT recommended. Will continue to follow acutely to maximize functional mobility independence.     Follow Up Recommendations No PT follow up;Supervision/Assistance - 24 hour (initially )    Equipment Recommendations  3in1 (PT)    Recommendations for Other Services       Precautions / Restrictions Precautions Precautions: None Required Braces or Orthoses: Other Brace/Splint Other Brace/Splint: post op shoe  Restrictions Weight Bearing Restrictions: Yes LLE Weight Bearing: Non weight bearing      Mobility  Bed Mobility Overal bed mobility: Modified Independent             General bed mobility comments: Increased time, however, no assist required.   Transfers Overall transfer level: Needs  assistance Equipment used: Rolling walker (2 wheeled);Crutches Transfers: Sit to/from Stand Sit to Stand: Min guard;Min assist         General transfer comment: Min guard to min A for steadying. Attempted with RW X1 and crutches X1. Pt required education to maintain NWB on LLE during transition to standing. Refused more practice but demonstrated appropriate technique with crutches.   Ambulation/Gait Ambulation/Gait assistance: Min guard;Supervision Ambulation Distance (Feet): 100 Feet Assistive device: Crutches;Rolling walker (2 wheeled) Gait Pattern/deviations: Decreased stride length;Trunk flexed (hop to patter ) Gait velocity: Decreased Gait velocity interpretation: Below normal speed for age/gender General Gait Details: Gait with RW for 25' into bathroom and for 100' using crutches. Pt with increased fatigue using RW, and only able to perform limited distances. Attempted gait with crutches, and pt with improved endurance. Min guard to supervision with safety. Slight LOB, however, pt able to self correct. Pt demonstrating good technique and able to maintain NWB status on LLE with use of crutches. Required 1 standing rest break secondary to fatigue. Pt reports improved comfort with use of crutches.   Stairs            Wheelchair Mobility    Modified Rankin (Stroke Patients Only)       Balance Overall balance assessment: Needs assistance Sitting-balance support: Feet supported;No upper extremity supported Sitting balance-Leahy Scale: Good     Standing balance support: Bilateral upper extremity supported;During functional activity Standing balance-Leahy Scale: Poor Standing balance comment: Reliant on BUE support for balance.                              Pertinent Vitals/Pain Pain  Assessment: 0-10 Pain Score: 9  Pain Location: L foot  Pain Descriptors / Indicators: Operative site guarding;Sore Pain Intervention(s): RN gave pain meds during session;Limited  activity within patient's tolerance;Monitored during session;Repositioned    Home Living Family/patient expects to be discharged to:: Private residence Living Arrangements: Alone Available Help at Discharge: Family;Available PRN/intermittently (states they can be available more ) Type of Home: House Home Access: Level entry     Home Layout: One level Home Equipment: Crutches      Prior Function Level of Independence: Independent         Comments: Still working      Journalist, newspaper        Extremity/Trunk Assessment   Upper Extremity Assessment Upper Extremity Assessment: Overall WFL for tasks assessed    Lower Extremity Assessment Lower Extremity Assessment: LLE deficits/detail LLE Deficits / Details: s/p surgery above. NWB on LLE. Post op shoe required     Cervical / Trunk Assessment Cervical / Trunk Assessment: Normal  Communication   Communication: No difficulties  Cognition Arousal/Alertness: Awake/alert Behavior During Therapy: WFL for tasks assessed/performed Overall Cognitive Status: Within Functional Limits for tasks assessed                                        General Comments General comments (skin integrity, edema, etc.): Pt educated about how to safely perform tub transfer. Educated about use of 3 in 1 as shower seat to increase safety with bathing. Demonstrated transfer for PT. Pt educated to have increased assist at home initially to improve safety and pt agreeable. Educated to make sure to maintain NWB on LLE as pt was having difficulty at beginning of session.     Exercises     Assessment/Plan    PT Assessment Patient needs continued PT services  PT Problem List Decreased strength;Decreased balance;Decreased mobility;Decreased knowledge of use of DME;Decreased knowledge of precautions;Pain       PT Treatment Interventions DME instruction;Gait training;Functional mobility training;Therapeutic activities;Therapeutic  exercise;Balance training;Neuromuscular re-education;Patient/family education    PT Goals (Current goals can be found in the Care Plan section)  Acute Rehab PT Goals Patient Stated Goal: to go home ASAP  PT Goal Formulation: With patient Time For Goal Achievement: 09/27/16 Potential to Achieve Goals: Good    Frequency Min 5X/week   Barriers to discharge        Co-evaluation               AM-PAC PT "6 Clicks" Daily Activity  Outcome Measure Difficulty turning over in bed (including adjusting bedclothes, sheets and blankets)?: A Little Difficulty moving from lying on back to sitting on the side of the bed? : A Little Difficulty sitting down on and standing up from a chair with arms (e.g., wheelchair, bedside commode, etc,.)?: Total Help needed moving to and from a bed to chair (including a wheelchair)?: A Little Help needed walking in hospital room?: A Little Help needed climbing 3-5 steps with a railing? : A Lot 6 Click Score: 15    End of Session Equipment Utilized During Treatment: Gait belt;Other (comment) (L post op shoe ) Activity Tolerance: Patient tolerated treatment well Patient left: in bed;with call bell/phone within reach;with family/visitor present Nurse Communication: Mobility status PT Visit Diagnosis: Unsteadiness on feet (R26.81);Other abnormalities of gait and mobility (R26.89);Pain Pain - Right/Left: Left Pain - part of body: Ankle and joints of foot  Time: 3220-2542 PT Time Calculation (min) (ACUTE ONLY): 25 min   Charges:   PT Evaluation $PT Eval Low Complexity: 1 Procedure PT Treatments $Gait Training: 8-22 mins   PT G Codes:        Leighton Ruff, PT, DPT  Acute Rehabilitation Services  Pager: [phone]   Rudean Hitt 09/20/2016, 4:30 PM

## 2016-09-23 LAB — CULTURE, BLOOD (ROUTINE X 2)
CULTURE: NO GROWTH
Culture: NO GROWTH
SPECIAL REQUESTS: ADEQUATE
Special Requests: ADEQUATE

## 2016-09-24 LAB — AEROBIC/ANAEROBIC CULTURE W GRAM STAIN (SURGICAL/DEEP WOUND)

## 2016-09-24 LAB — AEROBIC/ANAEROBIC CULTURE (SURGICAL/DEEP WOUND)

## 2016-09-27 ENCOUNTER — Encounter: Payer: Self-pay | Admitting: Podiatry

## 2016-09-27 ENCOUNTER — Ambulatory Visit (INDEPENDENT_AMBULATORY_CARE_PROVIDER_SITE_OTHER): Payer: Self-pay | Admitting: Podiatry

## 2016-09-27 DIAGNOSIS — E0842 Diabetes mellitus due to underlying condition with diabetic polyneuropathy: Secondary | ICD-10-CM

## 2016-09-28 NOTE — Progress Notes (Signed)
   Subjective:  Patient presents today status post partial fifth ray amputation. DOS: 09/19/16. He states he is doing well and denies any pain at this time. He denies any new complaints.    Objective/Physical Exam Skin incisions appear to be well coapted with sutures and staples intact. No sign of infectious process noted. No dehiscence. No active bleeding noted. Minimal sanguinous drainage noted. Moderate edema noted to the surgical extremity.  Radiographic Exam:  Osteotomies sites appear to be stable with routine healing.  Assessment: 1. s/p partial fifth ray amputation. DOS: 09/19/16.   Plan of Care:  1. Patient was evaluated. 2. Dressing change. 3. Dressings provided for patient for home dressing changes. 4. Return to clinic in 2 weeks.   [name], DPM [facility]  Dr. [name], [address]                                        [place], [place] [address]                Office [phone]  Fax [phone]

## 2016-09-29 NOTE — Addendum Note (Signed)
Addendum  created 09/29/16 0631 by Roberts Gaudy, MD   Anesthesia Staff edited

## 2016-10-06 DIAGNOSIS — N529 Male erectile dysfunction, unspecified: Secondary | ICD-10-CM | POA: Insufficient documentation

## 2016-10-18 ENCOUNTER — Ambulatory Visit: Payer: Self-pay | Admitting: Podiatry

## 2016-10-18 LAB — FUNGAL ORGANISM REFLEX

## 2016-10-18 LAB — FUNGUS CULTURE WITH STAIN

## 2016-10-18 LAB — FUNGUS CULTURE RESULT

## 2016-10-23 ENCOUNTER — Encounter: Payer: Self-pay | Admitting: Podiatry

## 2016-10-23 ENCOUNTER — Ambulatory Visit (INDEPENDENT_AMBULATORY_CARE_PROVIDER_SITE_OTHER): Payer: Self-pay | Admitting: Podiatry

## 2016-10-23 DIAGNOSIS — Z9889 Other specified postprocedural states: Secondary | ICD-10-CM

## 2016-10-23 MED ORDER — SULFAMETHOXAZOLE-TRIMETHOPRIM 800-160 MG PO TABS: 1.0000 | ORAL_TABLET | Freq: Two times a day (BID) | ORAL | 0 refills | Status: DC

## 2016-10-23 NOTE — Progress Notes (Signed)
   Subjective:  Patient presents today status post partial fifth ray amputation of the left foot. Date of surgery 09/19/2016. Patient states that he is doing well but he does have since tenderness to the remaining fifth ray of the left foot. Patient states that he has been doing excessive amount of walking because return to work recently. Patient presents today for further treatment and evaluation    Objective/Physical Exam Skin incisions appear to be well coapted with staples intact. Upon removal of the stem was still skin staples there does appear to be some purulent drainage noted within the pinhole sites. Negative for any cellulitis or periwound erythema. This appears to be very localized.  Assessment: 1. s/p partial fifth ray amputation left foot. DOS: 09/19/2016   Plan of Care:  1. Patient was evaluated.  2. Today cultures were taken of the purulent drainage from the staple sites and sent to pathology for cultures and sensitivity 3. Prescription for Bactrim DS #28 4. Recommended the patient applied Betadine and dry sterile dressing daily 5. Return to clinic in 3 weeks  Patient goes by Jorge Higgins, DPM [facility]  Dr. [name], [address]                                        [place], [place] [address]                Office [phone]  Fax [phone]

## 2016-10-26 ENCOUNTER — Telehealth: Payer: Self-pay | Admitting: Podiatry

## 2016-10-26 LAB — WOUND CULTURE
GRAM STAIN: NONE SEEN
Gram Stain: NONE SEEN

## 2016-10-26 MED ORDER — OXYCODONE-ACETAMINOPHEN 5-325 MG PO TABS: 1.0000 | ORAL_TABLET | Freq: Four times a day (QID) | ORAL | 0 refills | Status: DC | PRN

## 2016-10-26 NOTE — Telephone Encounter (Signed)
I informed pt Dr.Evans okayed the refill of the Percocet, and he would be able to pick up the rx in the [place] office.

## 2016-10-26 NOTE — Telephone Encounter (Signed)
I was calling to let Dr. Amalia Hailey know that I meant to ask for a Rx for pain medication because I'm still having pain. I am wearing my walking boot more but I'm still having throbbing pain in my foot at night.

## 2016-11-13 ENCOUNTER — Ambulatory Visit (INDEPENDENT_AMBULATORY_CARE_PROVIDER_SITE_OTHER): Payer: Self-pay | Admitting: Podiatry

## 2016-11-13 ENCOUNTER — Encounter: Payer: Self-pay | Admitting: Podiatry

## 2016-11-13 DIAGNOSIS — Z9889 Other specified postprocedural states: Secondary | ICD-10-CM

## 2016-11-13 DIAGNOSIS — E0842 Diabetes mellitus due to underlying condition with diabetic polyneuropathy: Secondary | ICD-10-CM

## 2016-11-13 MED ORDER — GENTAMICIN SULFATE 0.1 % EX CREA: 1.0000 "application " | TOPICAL_CREAM | Freq: Three times a day (TID) | CUTANEOUS | 1 refills | Status: DC

## 2016-11-13 NOTE — Progress Notes (Signed)
0

## 2016-11-13 NOTE — Progress Notes (Signed)
   Subjective:  Patient presents today status post partial fifth ray amputation of the left foot. Date of surgery 09/19/2016. Patient states that he is doing much better. He denies any drainage and believes that the ulcer to the amputation site has healed.   Objective/Physical Exam Skin incisions appear to be well coapted without any sign of drainage or open wound. There is a very superficial abrasion lesion noted to the dorsum of the right foot. This is a new complaint today. It appears well adhered without any drainage. No sign of infectious process noted. Superficial abrasion measures approximately 1 cm in diameter.  Assessment: 1. s/p partial fifth ray amputation left foot. DOS: 09/19/2016 2. Superficial abrasion lesion dorsum of the right foot   Plan of Care:  1. Patient was evaluated.  2. Patient recently finished his antibiotics. 3. Today the amputation site has completely healed. Recommended good foot cream   4. Today the patient was molded for custom molded insoles and diabetic shoes by Liliane Channel, Ped[facility] 5. Prescription for gentamicin cream  6. Return to clinic when necessary   Patient goes by Jorge Higgins, DPM [facility]  Dr. [name], [address]                                        [place], [place] [address]                Office [phone]  Fax [phone]

## 2017-01-04 ENCOUNTER — Encounter: Payer: Self-pay | Admitting: Orthotics

## 2017-04-03 HISTORY — PX: AMPUTATION TOE: SHX6595

## 2017-08-30 DIAGNOSIS — L03116 Cellulitis of left lower limb: Secondary | ICD-10-CM

## 2017-08-30 DIAGNOSIS — L97529 Non-pressure chronic ulcer of other part of left foot with unspecified severity: Secondary | ICD-10-CM

## 2017-08-30 DIAGNOSIS — M609 Myositis, unspecified: Secondary | ICD-10-CM

## 2017-08-30 DIAGNOSIS — E872 Acidosis: Secondary | ICD-10-CM

## 2017-08-30 DIAGNOSIS — E11621 Type 2 diabetes mellitus with foot ulcer: Secondary | ICD-10-CM

## 2017-09-05 ENCOUNTER — Encounter: Payer: Self-pay | Admitting: Podiatry

## 2017-09-09 NOTE — Progress Notes (Signed)
This encounter was created in error - please disregard.

## 2017-10-18 DIAGNOSIS — I160 Hypertensive urgency: Secondary | ICD-10-CM

## 2017-10-18 DIAGNOSIS — M86672 Other chronic osteomyelitis, left ankle and foot: Secondary | ICD-10-CM

## 2017-10-18 DIAGNOSIS — D72829 Elevated white blood cell count, unspecified: Secondary | ICD-10-CM

## 2017-10-18 DIAGNOSIS — E871 Hypo-osmolality and hyponatremia: Secondary | ICD-10-CM

## 2017-10-18 DIAGNOSIS — L03116 Cellulitis of left lower limb: Secondary | ICD-10-CM

## 2017-10-18 DIAGNOSIS — E11621 Type 2 diabetes mellitus with foot ulcer: Secondary | ICD-10-CM

## 2017-10-25 ENCOUNTER — Ambulatory Visit (INDEPENDENT_AMBULATORY_CARE_PROVIDER_SITE_OTHER): Payer: Self-pay

## 2017-10-25 ENCOUNTER — Ambulatory Visit (INDEPENDENT_AMBULATORY_CARE_PROVIDER_SITE_OTHER): Payer: Self-pay | Admitting: Sports Medicine

## 2017-10-25 ENCOUNTER — Encounter: Payer: Self-pay | Admitting: Sports Medicine

## 2017-10-25 DIAGNOSIS — E0842 Diabetes mellitus due to underlying condition with diabetic polyneuropathy: Secondary | ICD-10-CM

## 2017-10-25 DIAGNOSIS — Z89432 Acquired absence of left foot: Secondary | ICD-10-CM

## 2017-10-25 DIAGNOSIS — Z9889 Other specified postprocedural states: Secondary | ICD-10-CM

## 2017-10-25 NOTE — Progress Notes (Signed)
Subjective: Jorge Higgins is a 42 y.o. male patient seen today in office for POV #1 (DOS 10-18-17), S/P L TMA. Patient admits a little pain at surgical site and nausea, denies calf pain, denies headache, chest pain, shortness of breath, fever, or chills. Patient states that he is  taking Augmentin and Oxycodone for pain. No other issues noted.   Patient Active Problem List   Diagnosis Date Noted  . Osteomyelitis ([place]) 09/19/2016  . Leukocytosis 09/19/2016  . Chronic osteomyelitis of left foot ([place]) 08/17/2016  . Osteomyelitis of left foot ([place]) 08/17/2016  . Hypertension   . Diabetes mellitus without complication ([place])   . Tobacco abuse     Current Outpatient Medications on File Prior to Visit  Medication Sig Dispense Refill  . amLODipine (NORVASC) 5 MG tablet Take 1 tablet (5 mg total) by mouth daily. 30 tablet 2  . gentamicin cream (GARAMYCIN) 0.1 % Apply 1 application topically 3 (three) times daily. 15 g 1  . glyBURIDE (DIABETA) 5 MG tablet Take 5 mg by mouth 2 (two) times daily with a meal.    . lisinopril (PRINIVIL,ZESTRIL) 20 MG tablet Take 1 tablet (20 mg total) by mouth daily. 30 tablet 2  . metFORMIN (GLUCOPHAGE) 500 MG tablet TAKE 1 TABLET (500 MG TOTAL) BY MOUTH 2 (TWO) TIMES A DAY WITH MEALS.  1  . oxyCODONE-acetaminophen (PERCOCET/ROXICET) 5-325 MG tablet Take 1 tablet by mouth every 6 (six) hours as needed for severe pain. 15 tablet 0  . oxyCODONE-acetaminophen (ROXICET) 5-325 MG tablet Take 1 tablet by mouth every 6 (six) hours as needed for severe pain. 15 tablet 0  . simvastatin (ZOCOR) 20 MG tablet Take 20 mg by mouth daily.    Marland Kitchen sulfamethoxazole-trimethoprim (BACTRIM DS,SEPTRA DS) 800-160 MG tablet Take 1 tablet by mouth 2 (two) times daily. 28 tablet 0   No current facility-administered medications on file prior to visit.     Allergies  Allergen Reactions  . Hydromorphone Nausea And Vomiting  . Nickel Rash    Objective: There were no vitals filed for  this visit.  General: No acute distress, AAOx3  Left foot: Sutures intact with no gapping or dehiscence at surgical site, mild swelling and brusing to left forefoot, no erythema, no warmth, no drainage, no signs of infection noted, Capillary fill time <3 seconds to flaps, protective sensation diminished left foot. No pain or crepitation with range of motion left partial foot, s/p TMA.  No pain with calf compression.   Post Op Xray, Left foot: TMA. Soft tissue swelling within normal limits for post op status.   Intraoperative cultures consistent of strep, staph and Enterobacter  Assessment and Plan:  Problem List Items Addressed This Visit    None    Visit Diagnoses    S/P foot surgery, left    -  Primary   Relevant Orders   DG Foot Complete Left   History of transmetatarsal amputation of left foot (HCC)       Diabetes mellitus due to underlying condition with diabetic polyneuropathy, unspecified whether long term insulin use ([place])           -Patient seen and evaluated -Xrays reviewed  -Applied dry sterile dressing to surgical site left foot secured with ACE wrap and stockinet  -Advised patient to make sure to keep dressings clean, dry, and intact to left surgical site, removing the ACE as needed  -Patient came to office without crutches or postop shoe dispensed new postop shoe to patient and  prescribed a knee scooter for patient and advised patient to be compliant with nonweightbearing to the left foot to prevent dehiscence or worsening of amputation site  -Continue with oral antibiotics and pain medicines as needed -Patient to pick up Phenergan which she has at pharmacy already for nausea -Advised patient to limit activity to necessity  -Advised patient to continue with no work for 3 more weeks. -Will plan for surgical site check at next office visit. In the meantime, patient to call office if any issues or problems arise.   [name], DPM

## 2017-10-31 ENCOUNTER — Other Ambulatory Visit: Payer: Self-pay | Admitting: Sports Medicine

## 2017-10-31 DIAGNOSIS — Z9889 Other specified postprocedural states: Secondary | ICD-10-CM

## 2017-10-31 DIAGNOSIS — Z89432 Acquired absence of left foot: Secondary | ICD-10-CM

## 2017-10-31 DIAGNOSIS — E0842 Diabetes mellitus due to underlying condition with diabetic polyneuropathy: Secondary | ICD-10-CM

## 2017-11-01 ENCOUNTER — Encounter: Payer: Self-pay | Admitting: Sports Medicine

## 2017-11-01 ENCOUNTER — Ambulatory Visit (INDEPENDENT_AMBULATORY_CARE_PROVIDER_SITE_OTHER): Payer: Self-pay | Admitting: Sports Medicine

## 2017-11-01 VITALS — BP 147/98 | HR 89 | Temp 97.5°F | Resp 16

## 2017-11-01 DIAGNOSIS — Z9889 Other specified postprocedural states: Secondary | ICD-10-CM

## 2017-11-01 DIAGNOSIS — Z89432 Acquired absence of left foot: Secondary | ICD-10-CM

## 2017-11-01 DIAGNOSIS — E0842 Diabetes mellitus due to underlying condition with diabetic polyneuropathy: Secondary | ICD-10-CM

## 2017-11-01 NOTE — Progress Notes (Signed)
Subjective: Jorge Higgins is a 42 y.o. male patient seen today in office for POV #2 (DOS 10-18-17), S/P L TMA. Patient admits a little pain  proximal to the surgical site of stinging that sometimes can be 7 out of 10 that comes and small episodes but otherwise is fine.  Patient does report another episode of nausea, denies calf pain, denies headache, chest pain, shortness of breath, fever, or chills. Patient states that he is taking Augmentin and Oxycodone for pain. No other issues noted.   Patient Active Problem List   Diagnosis Date Noted  . Osteomyelitis ([place]) 09/19/2016  . Leukocytosis 09/19/2016  . Chronic osteomyelitis of left foot ([place]) 08/17/2016  . Osteomyelitis of left foot ([place]) 08/17/2016  . Hypertension   . Diabetes mellitus without complication ([place])   . Tobacco abuse     Current Outpatient Medications on File Prior to Visit  Medication Sig Dispense Refill  . amLODipine (NORVASC) 5 MG tablet Take 1 tablet (5 mg total) by mouth daily. 30 tablet 2  . gentamicin cream (GARAMYCIN) 0.1 % Apply 1 application topically 3 (three) times daily. 15 g 1  . glyBURIDE (DIABETA) 5 MG tablet Take 5 mg by mouth 2 (two) times daily with a meal.    . lisinopril (PRINIVIL,ZESTRIL) 20 MG tablet Take 1 tablet (20 mg total) by mouth daily. 30 tablet 2  . metFORMIN (GLUCOPHAGE) 500 MG tablet TAKE 1 TABLET (500 MG TOTAL) BY MOUTH 2 (TWO) TIMES A DAY WITH MEALS.  1  . oxyCODONE-acetaminophen (PERCOCET/ROXICET) 5-325 MG tablet Take 1 tablet by mouth every 6 (six) hours as needed for severe pain. 15 tablet 0  . oxyCODONE-acetaminophen (ROXICET) 5-325 MG tablet Take 1 tablet by mouth every 6 (six) hours as needed for severe pain. 15 tablet 0  . simvastatin (ZOCOR) 20 MG tablet Take 20 mg by mouth daily.    Marland Kitchen sulfamethoxazole-trimethoprim (BACTRIM DS,SEPTRA DS) 800-160 MG tablet Take 1 tablet by mouth 2 (two) times daily. 28 tablet 0   No current facility-administered medications on file prior to  visit.     Allergies  Allergen Reactions  . Hydromorphone Nausea And Vomiting  . Nickel Rash    Objective: There were no vitals filed for this visit.  General: No acute distress, AAOx3  Left foot: Sutures intact with no gapping or dehiscence at surgical site, mild swelling and brusing to left forefoot, no erythema, no warmth, no drainage, no signs of infection noted, Capillary fill time <3 seconds to flaps, protective sensation diminished left foot. No pain or crepitation with range of motion left partial foot, s/p TMA.  No pain with calf compression.   Assessment and Plan:  Problem List Items Addressed This Visit    None    Visit Diagnoses    S/P foot surgery, left    -  Primary   History of transmetatarsal amputation of left foot ([place])       Diabetes mellitus due to underlying condition with diabetic polyneuropathy, unspecified whether long term insulin use ([place])           -Patient seen and evaluated -Applied dry sterile dressing to surgical site left foot secured with ACE wrap and stockinet  -Advised patient to make sure to keep dressings clean, dry, and intact to left surgical site, removing the ACE as needed  -Continue with postop shoe and crutch to assist with offloading amputation stump site and left surgical foot -Continue with oral antibiotics and pain medicines as needed -Patient to pick  up Phenergan which she has at pharmacy already for nausea of which he still has to do -Advised patient to limit activity to necessity  -Advised patient to continue with no work for 2 more weeks.  Advised patient that likely he may not be able to return to the same previous [place] duties due to amputation status of foot may benefit from a low to sedentary work role/duty -Will plan for surgical site check and possible sutures at next office visit. In the meantime, patient to call office if any issues or problems arise.   [name], DPM

## 2017-11-09 ENCOUNTER — Encounter: Payer: Self-pay | Admitting: Sports Medicine

## 2017-11-09 ENCOUNTER — Ambulatory Visit (INDEPENDENT_AMBULATORY_CARE_PROVIDER_SITE_OTHER): Payer: Self-pay | Admitting: Sports Medicine

## 2017-11-09 VITALS — BP 129/93 | HR 96 | Resp 15

## 2017-11-09 DIAGNOSIS — E0842 Diabetes mellitus due to underlying condition with diabetic polyneuropathy: Secondary | ICD-10-CM

## 2017-11-09 DIAGNOSIS — Z9889 Other specified postprocedural states: Secondary | ICD-10-CM

## 2017-11-09 DIAGNOSIS — Z89432 Acquired absence of left foot: Secondary | ICD-10-CM

## 2017-11-09 NOTE — Progress Notes (Signed)
Subjective: Ferron Rielly Corlett is a 42 y.o. male patient seen today in office for POV #2 (DOS 10-18-17), S/P L TMA. Patient admits a little pain 5 out of 10 that comes and small episodes but otherwise is fine.  Patient does report another episode of nausea and is planning to see his primary care doctor because he also feels weak, denies calf pain, denies headache, chest pain, shortness of breath, fever, or chills.  Patient has finished Augmentin.  No other issues noted.   Patient Active Problem List   Diagnosis Date Noted  . Osteomyelitis ([place]) 09/19/2016  . Leukocytosis 09/19/2016  . Chronic osteomyelitis of left foot ([place]) 08/17/2016  . Osteomyelitis of left foot ([place]) 08/17/2016  . Hypertension   . Diabetes mellitus without complication ([place])   . Tobacco abuse     Current Outpatient Medications on File Prior to Visit  Medication Sig Dispense Refill  . amLODipine (NORVASC) 5 MG tablet Take 1 tablet (5 mg total) by mouth daily. 30 tablet 2  . gentamicin cream (GARAMYCIN) 0.1 % Apply 1 application topically 3 (three) times daily. 15 g 1  . glyBURIDE (DIABETA) 5 MG tablet Take 5 mg by mouth 2 (two) times daily with a meal.    . lisinopril (PRINIVIL,ZESTRIL) 20 MG tablet Take 1 tablet (20 mg total) by mouth daily. 30 tablet 2  . metFORMIN (GLUCOPHAGE) 500 MG tablet TAKE 1 TABLET (500 MG TOTAL) BY MOUTH 2 (TWO) TIMES A DAY WITH MEALS.  1  . oxyCODONE-acetaminophen (PERCOCET/ROXICET) 5-325 MG tablet Take 1 tablet by mouth every 6 (six) hours as needed for severe pain. 15 tablet 0  . oxyCODONE-acetaminophen (ROXICET) 5-325 MG tablet Take 1 tablet by mouth every 6 (six) hours as needed for severe pain. 15 tablet 0  . simvastatin (ZOCOR) 20 MG tablet Take 20 mg by mouth daily.    Marland Kitchen sulfamethoxazole-trimethoprim (BACTRIM DS,SEPTRA DS) 800-160 MG tablet Take 1 tablet by mouth 2 (two) times daily. 28 tablet 0   No current facility-administered medications on file prior to visit.     Allergies   Allergen Reactions  . Hydromorphone Nausea And Vomiting  . Nickel Rash    Objective: There were no vitals filed for this visit.  General: No acute distress, AAOx3  Left foot: Sutures intact with no gapping or dehiscence at surgical site, mild swelling and brusing and scabbing to left forefoot, no erythema, no warmth, no drainage, no signs of infection noted, Capillary fill time <3 seconds to flaps, protective sensation diminished left foot. No pain or crepitation with range of motion left partial foot, s/p TMA.  No pain with calf compression.   Assessment and Plan:  Problem List Items Addressed This Visit    None    Visit Diagnoses    S/P foot surgery, left    -  Primary   History of transmetatarsal amputation of left foot ([place])       Diabetes mellitus due to underlying condition with diabetic polyneuropathy, unspecified whether long term insulin use ([place])           -Patient seen and evaluated -A few staples were removed then applied dry sterile dressing to surgical site left foot secured with ACE wrap and stockinet  -Advised patient to make sure to keep dressings clean, dry, and intact to left surgical site, removing the ACE as needed  -Continue with postop shoe and crutch to assist with offloading amputation stump site and left surgical foot however patient comes to the office with  his normal shoes on again against my recommendation -Advised patient to limit activity to necessity  -Advised patient to continue with no work for 1 more weeks.  Advised patient that likely he may not be able to return to the same previous [place] duties due to amputation status of foot may benefit from a low to sedentary work role/duty as previous -Will plan for surgical site check and possible sutures at next office visit. In the meantime, patient to call office if any issues or problems arise.   [name], DPM

## 2017-11-12 IMAGING — DX DG FOOT COMPLETE 3+V*L*
4 series · 4 of 4 positions shown · non-contrast
Comparison: Radiographs 08/18/2016

CLINICAL DATA: Diabetic foot ulcer. Wound at the area of fifth
metatarsal phalangeal joint. Fever and chills since yesterday.

EXAM:
LEFT FOOT - COMPLETE 3+ VIEW

[foot ap]
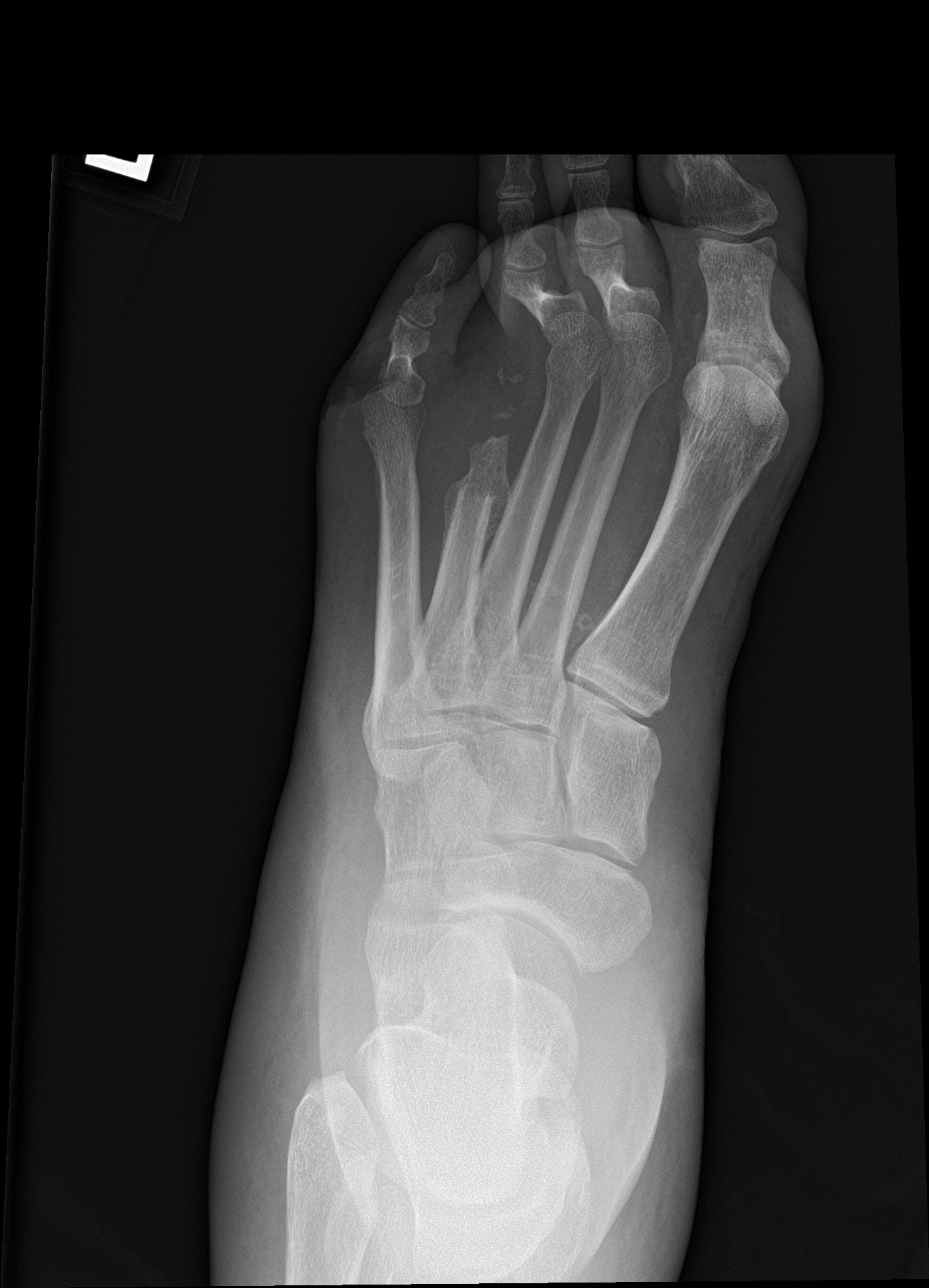

[foot obl (1 of 2)]
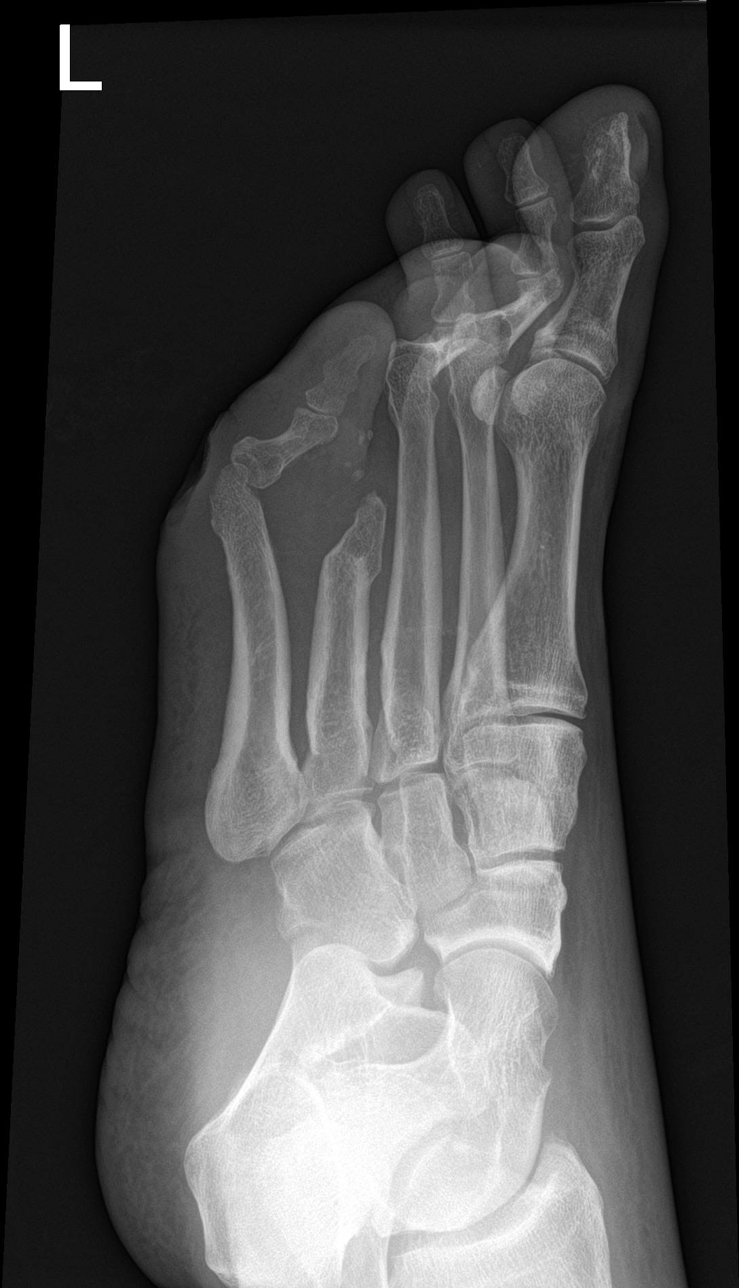

[foot lat]
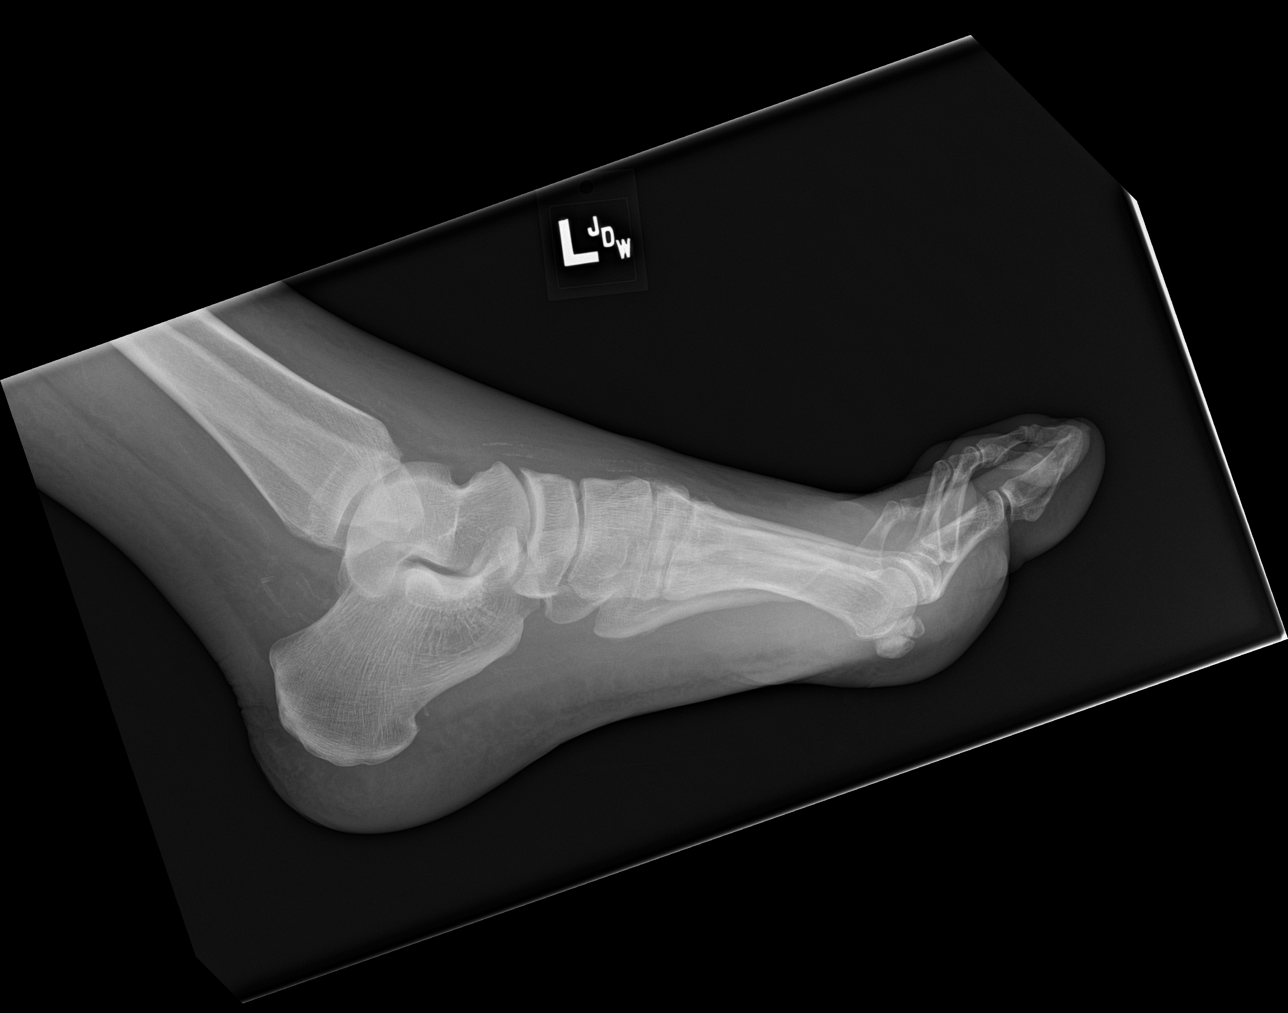

[foot obl (2 of 2)]
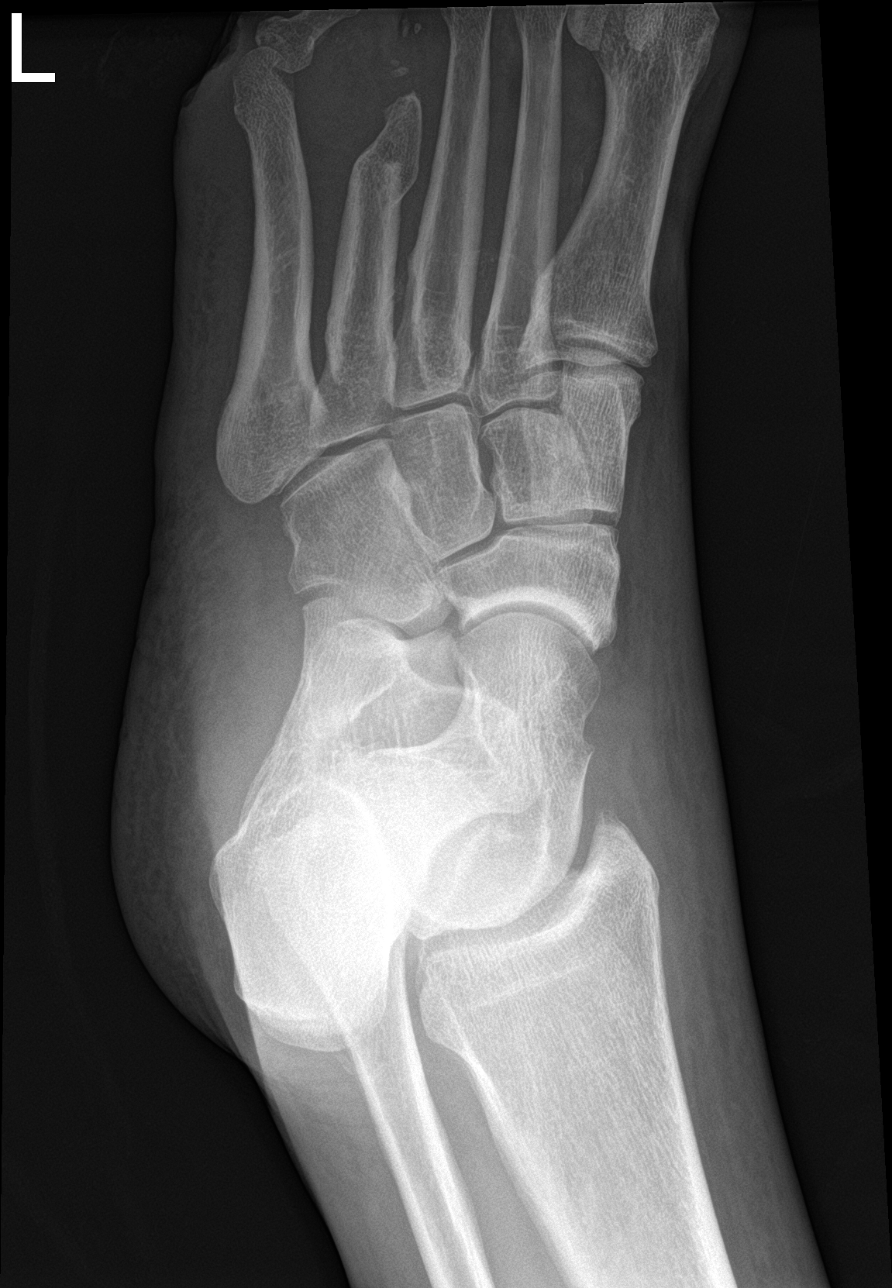

[4 of 4 positions shown; findings below may reference images not displayed]

FINDINGS: Soft tissue ulcer about the lateral foot in the region of the
metatarsal phalangeal joint. Associated cortical irregularity and
decreased density of the distal fifth metatarsal consistent with
osteomyelitis. No radiopaque foreign body. Sequela of 4 3 a
transmetatarsal amputation with minimal persistent irregularity of
the resection margin and small calcified fragments in the adjacent
soft tissues of the surgical bed. There are hammertoe deformity of
the second and third digits. No fracture or subluxation. Vascular
calcifications are seen.
IMPRESSION: 1. Ulcer about the lateral foot about the 5th metatarsal phalangeal
joint with findings suspicious for osteomyelitis of the distal fifth
metatarsal head.
2. Post fourth ray transmetatarsal amputation with unchanged
irregularity of the resection margin osseous fragmentation,
unchanged from prior exam.

## 2017-11-16 ENCOUNTER — Encounter: Payer: Self-pay | Admitting: *Deleted

## 2017-11-16 ENCOUNTER — Ambulatory Visit (INDEPENDENT_AMBULATORY_CARE_PROVIDER_SITE_OTHER): Payer: Self-pay | Admitting: Sports Medicine

## 2017-11-16 DIAGNOSIS — Z9889 Other specified postprocedural states: Secondary | ICD-10-CM

## 2017-11-16 DIAGNOSIS — Z89432 Acquired absence of left foot: Secondary | ICD-10-CM

## 2017-11-16 DIAGNOSIS — E0842 Diabetes mellitus due to underlying condition with diabetic polyneuropathy: Secondary | ICD-10-CM

## 2017-11-16 NOTE — Progress Notes (Signed)
Subjective: Jorge Higgins is a 42 y.o. male patient seen today in office for POV #3 (DOS 10-18-17), S/P L TMA. Patient admits a little pain 5 out of 10 that comes and small episodes but otherwise is fine. Patient reports that he no longer feels weak, denies calf pain, denies headache, chest pain, shortness of breath, fever, or chills. No other issues noted.   Patient Active Problem List   Diagnosis Date Noted  . Osteomyelitis ([place]) 09/19/2016  . Leukocytosis 09/19/2016  . 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 Osteomyelitis of left foot ([place]) 08/17/2016  . Hypertension   . Diabetes mellitus without complication ([place])   . Tobacco abuse     Current Outpatient Medications on File Prior to Visit  Medication Sig Dispense Refill  . amLODipine (NORVASC) 5 MG tablet Take 1 tablet (5 mg total) by mouth daily. 30 tablet 2  . gentamicin cream (GARAMYCIN) 0.1 % Apply 1 application topically 3 (three) times daily. 15 g 1  . glyBURIDE (DIABETA) 5 MG tablet Take 5 mg by mouth 2 (two) times daily with a meal.    . lisinopril (PRINIVIL,ZESTRIL) 20 MG tablet Take 1 tablet (20 mg total) by mouth daily. 30 tablet 2  . metFORMIN (GLUCOPHAGE) 500 MG tablet TAKE 1 TABLET (500 MG TOTAL) BY MOUTH 2 (TWO) TIMES A DAY WITH MEALS.  1  . oxyCODONE-acetaminophen (PERCOCET/ROXICET) 5-325 MG tablet Take 1 tablet by mouth every 6 (six) hours as needed for severe pain. 15 tablet 0  . oxyCODONE-acetaminophen (ROXICET) 5-325 MG tablet Take 1 tablet by mouth every 6 (six) hours as needed for severe pain. 15 tablet 0  . simvastatin (ZOCOR) 20 MG tablet Take 20 mg by mouth daily.    Marland Kitchen sulfamethoxazole-trimethoprim (BACTRIM DS,SEPTRA DS) 800-160 MG tablet Take 1 tablet by mouth 2 (two) times daily. 28 tablet 0   No current facility-administered medications on file prior to visit.     Allergies  Allergen Reactions  . Hydromorphone Nausea And Vomiting  . Nickel Rash    Objective: There were no vitals  filed for this visit.  General: No acute distress, AAOx3  Left foot: Sutures intact with no gapping or dehiscence at surgical site, mild swelling and brusing and scabbing to left forefoot central incision line, no erythema, no warmth, no drainage, no signs of infection noted, Capillary fill time <3 seconds to flaps, protective sensation diminished left foot. No pain or crepitation with range of motion left partial foot, s/p TMA.  No pain with calf compression.   Assessment and Plan:  Problem List Items Addressed This Visit    None    Visit Diagnoses    S/P foot surgery, left    -  Primary   History of transmetatarsal amputation of left foot (HCC)       Diabetes mellitus due to underlying condition with diabetic polyneuropathy, unspecified whether long term insulin use ([place])           -Patient seen and evaluated -A few staples were removed then applied dry sterile dressing to surgical site left foot secured with ACE wrap and stockinet  -Advised patient to make sure to keep dressings clean, dry, and intact to left surgical site, removing the ACE as needed  -Continue with postop shoe and crutch to assist with offloading amputation stump site and left surgical foot however patient comes to the office with his normal shoes on again against my recommendation as previous -Advised patient to limit activity to necessity  -  Advised patient to continue with no work for 1 month advised patient that likely he may not be able to return to the same previous [place] duties due to amputation status of foot may benefit from a low to sedentary work role/duty as previous -Will plan for surgical site check and possible finishing sutures at next office visit. In the meantime, patient to call office if any issues or problems arise.   [name], DPM

## 2017-11-22 ENCOUNTER — Ambulatory Visit (INDEPENDENT_AMBULATORY_CARE_PROVIDER_SITE_OTHER): Payer: Self-pay | Admitting: Sports Medicine

## 2017-11-22 ENCOUNTER — Encounter: Payer: Self-pay | Admitting: Sports Medicine

## 2017-11-22 DIAGNOSIS — Z9889 Other specified postprocedural states: Secondary | ICD-10-CM

## 2017-11-22 DIAGNOSIS — Z89432 Acquired absence of left foot: Secondary | ICD-10-CM

## 2017-11-22 DIAGNOSIS — E0842 Diabetes mellitus due to underlying condition with diabetic polyneuropathy: Secondary | ICD-10-CM

## 2017-11-22 NOTE — Progress Notes (Signed)
Subjective: Jorge Higgins is a 42 y.o. male patient seen today in office for POV #4 (DOS 10-18-17), S/P L TMA. Patient admits a little tingling. Patient reports that he is doing good, denies calf pain, denies headache, chest pain, shortness of breath, fever, or chills. No other issues noted.   Patient Active Problem List   Diagnosis Date Noted  . Osteomyelitis ([place]) 09/19/2016  . Leukocytosis 09/19/2016  . Chronic osteomyelitis of left foot ([place]) 08/17/2016  . Osteomyelitis of left foot ([place]) 08/17/2016  . Hypertension   . Diabetes mellitus without complication ([place])   . Tobacco abuse     Current Outpatient Medications on File Prior to Visit  Medication Sig Dispense Refill  . amLODipine (NORVASC) 5 MG tablet Take 1 tablet (5 mg total) by mouth daily. 30 tablet 2  . gentamicin cream (GARAMYCIN) 0.1 % Apply 1 application topically 3 (three) times daily. 15 g 1  . glyBURIDE (DIABETA) 5 MG tablet Take 5 mg by mouth 2 (two) times daily with a meal.    . lisinopril (PRINIVIL,ZESTRIL) 20 MG tablet Take 1 tablet (20 mg total) by mouth daily. 30 tablet 2  . metFORMIN (GLUCOPHAGE) 500 MG tablet TAKE 1 TABLET (500 MG TOTAL) BY MOUTH 2 (TWO) TIMES A DAY WITH MEALS.  1  . oxyCODONE-acetaminophen (PERCOCET/ROXICET) 5-325 MG tablet Take 1 tablet by mouth every 6 (six) hours as needed for severe pain. 15 tablet 0  . oxyCODONE-acetaminophen (ROXICET) 5-325 MG tablet Take 1 tablet by mouth every 6 (six) hours as needed for severe pain. 15 tablet 0  . simvastatin (ZOCOR) 20 MG tablet Take 20 mg by mouth daily.    Marland Kitchen sulfamethoxazole-trimethoprim (BACTRIM DS,SEPTRA DS) 800-160 MG tablet Take 1 tablet by mouth 2 (two) times daily. 28 tablet 0   No current facility-administered medications on file prior to visit.     Allergies  Allergen Reactions  . Hydromorphone Nausea And Vomiting  . Nickel Rash    Objective: There were no vitals filed for this visit.  General: No acute distress, AAOx3  Left  foot: Remaining staples and sutures intact with no gapping or dehiscence at surgical site, mild swelling and brusing and scabbing to left forefoot central incision line, no erythema, no warmth, no drainage, no signs of infection noted, Capillary fill time <3 seconds to flaps, protective sensation diminished left foot. No pain or crepitation with range of motion left partial foot, s/p TMA.  No pain with calf compression.   Assessment and Plan:  Problem List Items Addressed This Visit    None    Visit Diagnoses    S/P foot surgery, left    -  Primary   History of transmetatarsal amputation of left foot (HCC)       Diabetes mellitus due to underlying condition with diabetic polyneuropathy, unspecified whether long term insulin use ([place])           -Patient seen and evaluated -All staples were removed then applied dry sterile dressing to surgical site left foot secured with ACE wrap and stockinet  -Advised patient to make sure to keep dressings clean, dry, and intact to left surgical site, removing the ACE as needed  -Continue with postop shoe and crutch to assist with offloading amputation stump site and left surgical foot however patient comes to the office with his normal shoes on again against my recommendation as previous -Advised patient to limit activity to necessity  -Advised patient to continue with no work for 3 to 4  weeks dvised patient that likely he may not be able to return to the same previous [place] duties due to amputation status of foot may benefit from a low to sedentary work role/duty as previous; patient to bring medical duty paperwork by office for me to complete -Will plan for surgical site check and possible finishing sutures at next office visit. In the meantime, patient to call office if any issues or problems arise.   [name], DPM

## 2017-11-30 ENCOUNTER — Encounter: Payer: Self-pay | Admitting: Sports Medicine

## 2017-11-30 ENCOUNTER — Ambulatory Visit (INDEPENDENT_AMBULATORY_CARE_PROVIDER_SITE_OTHER): Payer: Self-pay | Admitting: Sports Medicine

## 2017-11-30 DIAGNOSIS — Z89432 Acquired absence of left foot: Secondary | ICD-10-CM

## 2017-11-30 DIAGNOSIS — Z9889 Other specified postprocedural states: Secondary | ICD-10-CM

## 2017-11-30 DIAGNOSIS — E0842 Diabetes mellitus due to underlying condition with diabetic polyneuropathy: Secondary | ICD-10-CM

## 2017-11-30 NOTE — Progress Notes (Signed)
Subjective: Jorge Higgins is a 42 y.o. male patient seen today in office for POV #5 (DOS 10-18-17), S/P L TMA. Patient admits a little tingling but no pain. Patient reports that he is doing good, denies calf pain, denies headache, chest pain, shortness of breath, fever, or chills. No other issues noted.   Patient Active Problem List   Diagnosis Date Noted  . Osteomyelitis ([place]) 09/19/2016  . Leukocytosis 09/19/2016  . Chronic osteomyelitis of left foot ([place]) 08/17/2016  . Osteomyelitis of left foot ([place]) 08/17/2016  . Hypertension   . Diabetes mellitus without complication ([place])   . Tobacco abuse     Current Outpatient Medications on File Prior to Visit  Medication Sig Dispense Refill  . amLODipine (NORVASC) 5 MG tablet Take 1 tablet (5 mg total) by mouth daily. 30 tablet 2  . gentamicin cream (GARAMYCIN) 0.1 % Apply 1 application topically 3 (three) times daily. 15 g 1  . glyBURIDE (DIABETA) 5 MG tablet Take 5 mg by mouth 2 (two) times daily with a meal.    . lisinopril (PRINIVIL,ZESTRIL) 20 MG tablet Take 1 tablet (20 mg total) by mouth daily. 30 tablet 2  . metFORMIN (GLUCOPHAGE) 500 MG tablet TAKE 1 TABLET (500 MG TOTAL) BY MOUTH 2 (TWO) TIMES A DAY WITH MEALS.  1  . oxyCODONE-acetaminophen (PERCOCET/ROXICET) 5-325 MG tablet Take 1 tablet by mouth every 6 (six) hours as needed for severe pain. 15 tablet 0  . oxyCODONE-acetaminophen (ROXICET) 5-325 MG tablet Take 1 tablet by mouth every 6 (six) hours as needed for severe pain. 15 tablet 0  . simvastatin (ZOCOR) 20 MG tablet Take 20 mg by mouth daily.    Marland Kitchen sulfamethoxazole-trimethoprim (BACTRIM DS,SEPTRA DS) 800-160 MG tablet Take 1 tablet by mouth 2 (two) times daily. 28 tablet 0   No current facility-administered medications on file prior to visit.     Allergies  Allergen Reactions  . Hydromorphone Nausea And Vomiting  . Nickel Rash    Objective: There were no vitals filed for this visit.  General: No acute distress,  AAOx3  Left foot: Remaining sutures intact with no gapping or dehiscence at surgical site, mild swelling and brusing and scabbing to left forefoot central incision line, no erythema, no warmth, no drainage, no signs of infection noted, Capillary fill time <3 seconds to flaps, protective sensation diminished left foot. No pain or crepitation with range of motion left partial foot, s/p TMA.  No pain with calf compression.   Assessment and Plan:  Problem List Items Addressed This Visit    None    Visit Diagnoses    S/P foot surgery, left    -  Primary   History of transmetatarsal amputation of left foot (HCC)       Diabetes mellitus due to underlying condition with diabetic polyneuropathy, unspecified whether long term insulin use ([place])          -Patient seen and evaluated -All sutures were removed then applied dry sterile dressing to surgical site left foot secured with ACE wrap and stockinet  -Advised patient to make sure to keep dressings clean, dry, and intact to left surgical site, removing the ACE as needed  -Continue with postop shoe however patient comes to the office with his normal shoes on again against my recommendation as previous -Advised patient to limit activity to necessity  -Advised patient to continue with no work  -Will plan for surgical site check and discussing how we can get him a custom insole  made versus continuing to cover him pad and protect the area at next office visit. In the meantime, patient to call office if any issues or problems arise.   [name], DPM

## 2017-12-07 ENCOUNTER — Encounter: Payer: Self-pay | Admitting: Sports Medicine

## 2017-12-07 ENCOUNTER — Ambulatory Visit (INDEPENDENT_AMBULATORY_CARE_PROVIDER_SITE_OTHER): Payer: Self-pay | Admitting: Sports Medicine

## 2017-12-07 DIAGNOSIS — Z89432 Acquired absence of left foot: Secondary | ICD-10-CM

## 2017-12-07 DIAGNOSIS — E0842 Diabetes mellitus due to underlying condition with diabetic polyneuropathy: Secondary | ICD-10-CM

## 2017-12-07 DIAGNOSIS — Z9889 Other specified postprocedural states: Secondary | ICD-10-CM

## 2017-12-07 NOTE — Progress Notes (Signed)
Subjective: Jorge Higgins is a 42 y.o. male patient seen today in office for POV #6 (DOS 10-18-17), S/P L TMA. Patient admits to a little soreness to the surgical site but overall feels good and is concerned about [name] because he states that he thought about all of his responsibilities and because of his partial foot amputation is well aware that he cannot do the duties that he used to do at work cannot push heavy loads or walk long periods of time or lift objects or climb ladders like he was doing previously.  Patient reports that he has used up all of his medical leave and is awaiting an evaluation with a disability doctor for his Medicare Medicaid claims. Patient denies calf pain, denies headache, chest pain, shortness of breath, fever, or chills. No other issues noted.   Patient Active Problem List   Diagnosis Date Noted  . Osteomyelitis ([place]) 09/19/2016  . Leukocytosis 09/19/2016  . Chronic osteomyelitis of left foot ([place]) 08/17/2016  . Osteomyelitis of left foot ([place]) 08/17/2016  . Hypertension   . Diabetes mellitus without complication ([place])   . Tobacco abuse     Current Outpatient Medications on File Prior to Visit  Medication Sig Dispense Refill  . amLODipine (NORVASC) 5 MG tablet Take 1 tablet (5 mg total) by mouth daily. 30 tablet 2  . gentamicin cream (GARAMYCIN) 0.1 % Apply 1 application topically 3 (three) times daily. 15 g 1  . glyBURIDE (DIABETA) 5 MG tablet Take 5 mg by mouth 2 (two) times daily with a meal.    . lisinopril (PRINIVIL,ZESTRIL) 20 MG tablet Take 1 tablet (20 mg total) by mouth daily. 30 tablet 2  . metFORMIN (GLUCOPHAGE) 500 MG tablet TAKE 1 TABLET (500 MG TOTAL) BY MOUTH 2 (TWO) TIMES A DAY WITH MEALS.  1  . oxyCODONE-acetaminophen (PERCOCET/ROXICET) 5-325 MG tablet Take 1 tablet by mouth every 6 (six) hours as needed for severe pain. 15 tablet 0  . oxyCODONE-acetaminophen (ROXICET) 5-325 MG tablet Take 1 tablet by mouth every 6 (six) hours as needed  for severe pain. 15 tablet 0  . simvastatin (ZOCOR) 20 MG tablet Take 20 mg by mouth daily.    Marland Kitchen sulfamethoxazole-trimethoprim (BACTRIM DS,SEPTRA DS) 800-160 MG tablet Take 1 tablet by mouth 2 (two) times daily. 28 tablet 0   No current facility-administered medications on file prior to visit.     Allergies  Allergen Reactions  . Hydromorphone Nausea And Vomiting  . Nickel Rash    Objective: There were no vitals filed for this visit.  General: No acute distress, AAOx3  Left foot: Suture line intact however centrally there is a small less than 2 mm opening centrally with mild fibrotic sloughed present but no active drainage no warmth no redness or any other acute signs of infection noted, Capillary fill time intact to flaps, protective sensation diminished left foot. No pain or crepitation with range of motion left partial foot, s/p TMA.  No pain with calf compression.   Assessment and Plan:  Problem List Items Addressed This Visit    None    Visit Diagnoses    S/P foot surgery, left    -  Primary   History of transmetatarsal amputation of left foot ([place])       Diabetes mellitus due to underlying condition with diabetic polyneuropathy, unspecified whether long term insulin use ([place])          -Patient seen and evaluated -Surgical incision line was debrided and applied  a small amount of Prisma dressing covered with dry dressing for patient to keep clean dry and intact to help the central aspect where there is a small amount of fibrotic tissue present heel -Patient still comes to office with normal tennis shoe even though previously has been recommended for him to continue with postop shoe -Advised patient to limit activity to necessity  -Patient is unable to work due to physical impairment of the left foot due to his partial foot amputation status.  Disability/medical duty restriction paperwork was completed for [employer] and given to patient to present to his [name] and advised  patient that he cannot return to work under the circumstances due to his inability to perform due to his partial foot amputation which in the long-term will be the best option for him because he is a high risk patient with increased chance of loss of limb versus life if he gets recurrent infection -Will plan for surgical site check/wound care and discussing how we can get him a custom insole made versus continuing to cover him pad and protect the area at next office visit. In the meantime, patient to call office if any issues or problems arise.   [name], DPM

## 2017-12-13 ENCOUNTER — Encounter: Payer: Self-pay | Admitting: *Deleted

## 2017-12-13 ENCOUNTER — Encounter: Payer: Self-pay | Admitting: Sports Medicine

## 2017-12-13 ENCOUNTER — Ambulatory Visit (INDEPENDENT_AMBULATORY_CARE_PROVIDER_SITE_OTHER): Payer: Self-pay | Admitting: Sports Medicine

## 2017-12-13 VITALS — BP 169/112 | HR 91 | Temp 97.6°F | Resp 15

## 2017-12-13 DIAGNOSIS — Z89432 Acquired absence of left foot: Secondary | ICD-10-CM

## 2017-12-13 DIAGNOSIS — Z9889 Other specified postprocedural states: Secondary | ICD-10-CM

## 2017-12-13 DIAGNOSIS — E0842 Diabetes mellitus due to underlying condition with diabetic polyneuropathy: Secondary | ICD-10-CM

## 2017-12-13 NOTE — Progress Notes (Signed)
Subjective: Jorge Higgins is a 42 y.o. male patient seen today in office for POV #7 (DOS 10-18-17), S/P L TMA. Patient admits to a little soreness and nerve related pain.  Reports that he needs a note for his child custody hearing stating that he cannot work for child support purposes.  Patient denies calf pain, denies headache, chest pain, shortness of breath, fever, or chills. No other issues noted.   Patient Active Problem List   Diagnosis Date Noted  . Osteomyelitis ([place]) 09/19/2016  . Leukocytosis 09/19/2016  . Chronic osteomyelitis of left foot ([place]) 08/17/2016  . Osteomyelitis of left foot ([place]) 08/17/2016  . Hypertension   . Diabetes mellitus without complication ([place])   . Tobacco abuse     Current Outpatient Medications on File Prior to Visit  Medication Sig Dispense Refill  . amLODipine (NORVASC) 5 MG tablet Take 1 tablet (5 mg total) by mouth daily. 30 tablet 2  . gentamicin cream (GARAMYCIN) 0.1 % Apply 1 application topically 3 (three) times daily. 15 g 1  . glyBURIDE (DIABETA) 5 MG tablet Take 5 mg by mouth 2 (two) times daily with a meal.    . lisinopril (PRINIVIL,ZESTRIL) 20 MG tablet Take 1 tablet (20 mg total) by mouth daily. 30 tablet 2  . metFORMIN (GLUCOPHAGE) 500 MG tablet TAKE 1 TABLET (500 MG TOTAL) BY MOUTH 2 (TWO) TIMES A DAY WITH MEALS.  1  . oxyCODONE-acetaminophen (PERCOCET/ROXICET) 5-325 MG tablet Take 1 tablet by mouth every 6 (six) hours as needed for severe pain. 15 tablet 0  . oxyCODONE-acetaminophen (ROXICET) 5-325 MG tablet Take 1 tablet by mouth every 6 (six) hours as needed for severe pain. 15 tablet 0  . simvastatin (ZOCOR) 20 MG tablet Take 20 mg by mouth daily.    Marland Kitchen sulfamethoxazole-trimethoprim (BACTRIM DS,SEPTRA DS) 800-160 MG tablet Take 1 tablet by mouth 2 (two) times daily. 28 tablet 0   No current facility-administered medications on file prior to visit.     Allergies  Allergen Reactions  . Hydromorphone Nausea And Vomiting  .  Nickel Rash    Objective: There were no vitals filed for this visit.  General: No acute distress, AAOx3  Left foot: Incision site healing well however centrally there is a small less than 1 mm opening centrally with mild fibrotic sloughed present that appears to be improving since last week but no active drainage no warmth no redness or any other acute signs of infection noted, Capillary fill time intact to flaps, protective sensation diminished left foot. No pain or crepitation with range of motion left partial foot, s/p TMA.  No pain with calf compression.   Assessment and Plan:  Problem List Items Addressed This Visit    None    Visit Diagnoses    S/P foot surgery, left    -  Primary   History of transmetatarsal amputation of left foot ([place])       Diabetes mellitus due to underlying condition with diabetic polyneuropathy, unspecified whether long term insulin use ([place])          -Patient seen and evaluated -Surgical incision line was debrided and applied a small amount of Prisma dressing covered with dry dressing for patient to keep clean dry and intact to help the central aspect like previous -Patient still comes to office with normal tennis shoe even though previously has been recommended for him to continue with postop shoe.  Patient will benefit once he gets Medicaid for custom insole. -Advised patient to  limit activity to necessity  -Patient is unable to work due to physical impairment of the left foot due to his partial foot amputation status.  Child support note given. -Will plan for surgical site check/wound care and discussing how we can get him a custom insole made versus continuing to cover him pad and protect the area at next office visit in preparation for this site to be healed. In the meantime, patient to call office if any issues or problems arise.   [name], DPM

## 2017-12-14 ENCOUNTER — Encounter: Payer: Self-pay | Admitting: Sports Medicine

## 2017-12-21 ENCOUNTER — Encounter: Payer: Self-pay | Admitting: Sports Medicine

## 2017-12-21 ENCOUNTER — Ambulatory Visit (INDEPENDENT_AMBULATORY_CARE_PROVIDER_SITE_OTHER): Payer: Self-pay | Admitting: Sports Medicine

## 2017-12-21 VITALS — BP 106/84 | HR 64 | Temp 97.9°F | Resp 15

## 2017-12-21 DIAGNOSIS — Z9889 Other specified postprocedural states: Secondary | ICD-10-CM

## 2017-12-21 DIAGNOSIS — E0842 Diabetes mellitus due to underlying condition with diabetic polyneuropathy: Secondary | ICD-10-CM

## 2017-12-21 DIAGNOSIS — Z89432 Acquired absence of left foot: Secondary | ICD-10-CM

## 2017-12-21 NOTE — Progress Notes (Signed)
Subjective: Jorge Higgins is a 42 y.o. male patient seen today in office for POV #8 (DOS 10-18-17), S/P L TMA. Patient admits to continued nerve related pain but otherwise is doing good.  Patient denies calf pain, denies headache, chest pain, shortness of breath, fever, or chills. No other issues noted.   Patient reports that he is waiting to see if he can qualify for disability and is waiting to get Medicaid.  Patient Active Problem List   Diagnosis Date Noted  . Osteomyelitis ([place]) 09/19/2016  . Leukocytosis 09/19/2016  . Chronic osteomyelitis of left foot ([place]) 08/17/2016  . Osteomyelitis of left foot ([place]) 08/17/2016  . Hypertension   . Diabetes mellitus without complication ([place])   . Tobacco abuse     Current Outpatient Medications on File Prior to Visit  Medication Sig Dispense Refill  . amLODipine (NORVASC) 5 MG tablet Take 1 tablet (5 mg total) by mouth daily. 30 tablet 2  . gentamicin cream (GARAMYCIN) 0.1 % Apply 1 application topically 3 (three) times daily. 15 g 1  . glyBURIDE (DIABETA) 5 MG tablet Take 5 mg by mouth 2 (two) times daily with a meal.    . lisinopril (PRINIVIL,ZESTRIL) 20 MG tablet Take 1 tablet (20 mg total) by mouth daily. 30 tablet 2  . metFORMIN (GLUCOPHAGE) 500 MG tablet TAKE 1 TABLET (500 MG TOTAL) BY MOUTH 2 (TWO) TIMES A DAY WITH MEALS.  1  . oxyCODONE-acetaminophen (PERCOCET/ROXICET) 5-325 MG tablet Take 1 tablet by mouth every 6 (six) hours as needed for severe pain. 15 tablet 0  . oxyCODONE-acetaminophen (ROXICET) 5-325 MG tablet Take 1 tablet by mouth every 6 (six) hours as needed for severe pain. 15 tablet 0  . simvastatin (ZOCOR) 20 MG tablet Take 20 mg by mouth daily.    Marland Kitchen sulfamethoxazole-trimethoprim (BACTRIM DS,SEPTRA DS) 800-160 MG tablet Take 1 tablet by mouth 2 (two) times daily. 28 tablet 0   No current facility-administered medications on file prior to visit.     Allergies  Allergen Reactions  . Hydromorphone Nausea And Vomiting   . Nickel Rash    Objective: There were no vitals filed for this visit.  General: No acute distress, AAOx3  Left foot: Incision site healing well however centrally there is a small less than 0.5 mm opening centrally with mild fibrotic sloughed area present that appears to be improving since last week but no active drainage no warmth no redness or any other acute signs of infection noted, Capillary fill time intact to flaps, protective sensation diminished left foot. No pain or crepitation with range of motion left partial foot, s/p TMA.  No pain with calf compression.   Assessment and Plan:  Problem List Items Addressed This Visit    None    Visit Diagnoses    S/P foot surgery, left    -  Primary   History of transmetatarsal amputation of left foot ([name])       Diabetes mellitus due to underlying condition with diabetic polyneuropathy, unspecified whether long term insulin use ([name])          -Patient seen and evaluated -Surgical incision line was debrided and applied a small amount of Prisma dressing covered with dry dressing for patient to keep clean dry and intact to help the central aspect like previous -Patient still comes to office with normal tennis shoe even though previously has been recommended for him to continue with postop shoe.  Patient will benefit once he gets Medicaid for custom insole  however meanwhile the patient can afford this will have patient seen in 2 weeks with Benjie Karvonen to decide if we can make patient something similar with items that we have been office/offer charity care. -Advised patient to limit activity to necessity  -Patient is unable to work due to physical impairment of the left foot due to his partial foot amputation status as previously documented -Will plan for surgical site check/wound care.  In the meantime, patient to call office if any issues or problems arise.   [name], DPM

## 2017-12-24 ENCOUNTER — Encounter: Payer: Self-pay | Admitting: Sports Medicine

## 2017-12-24 NOTE — Progress Notes (Signed)
Medical records requested by Disability Determination Service were placed in a manila envelope and placed up front to have postage added and to be mailed out tomorrow, Tuesday, 24 September. They are being mailed to the following address:  [facility] [address], [place] [address]

## 2017-12-28 ENCOUNTER — Ambulatory Visit (INDEPENDENT_AMBULATORY_CARE_PROVIDER_SITE_OTHER): Payer: Self-pay | Admitting: Sports Medicine

## 2017-12-28 ENCOUNTER — Encounter: Payer: Self-pay | Admitting: Sports Medicine

## 2017-12-28 VITALS — BP 166/99 | HR 89 | Temp 97.8°F | Resp 16

## 2017-12-28 DIAGNOSIS — Z9889 Other specified postprocedural states: Secondary | ICD-10-CM

## 2017-12-28 DIAGNOSIS — Z89432 Acquired absence of left foot: Secondary | ICD-10-CM

## 2017-12-28 DIAGNOSIS — E0842 Diabetes mellitus due to underlying condition with diabetic polyneuropathy: Secondary | ICD-10-CM

## 2017-12-28 NOTE — Progress Notes (Signed)
Subjective: Slayden Zamari Vea is a 42 y.o. male patient seen today in office for POV #9 (DOS 10-18-17), S/P L TMA. Patient admits to continued nerve related pain but otherwise is doing good and states he got new shoes.  Patient denies calf pain, denies headache, chest pain, shortness of breath, fever, or chills. FBS this AM was 130, A1c 6 and saw PCP on Monday, Dr. Redmond Pulling. No other issues noted.   Patient Active Problem List   Diagnosis Date Noted  . Osteomyelitis ([place]) 09/19/2016  . Leukocytosis 09/19/2016  . Chronic osteomyelitis of left foot ([place]) 08/17/2016  . Osteomyelitis of left foot ([place]) 08/17/2016  . Hypertension   . Diabetes mellitus without complication ([place])   . Tobacco abuse     Current Outpatient Medications on File Prior to Visit  Medication Sig Dispense Refill  . amLODipine (NORVASC) 5 MG tablet Take 1 tablet (5 mg total) by mouth daily. 30 tablet 2  . gentamicin cream (GARAMYCIN) 0.1 % Apply 1 application topically 3 (three) times daily. 15 g 1  . glyBURIDE (DIABETA) 5 MG tablet Take 5 mg by mouth 2 (two) times daily with a meal.    . lisinopril (PRINIVIL,ZESTRIL) 20 MG tablet Take 1 tablet (20 mg total) by mouth daily. 30 tablet 2  . metFORMIN (GLUCOPHAGE) 500 MG tablet TAKE 1 TABLET (500 MG TOTAL) BY MOUTH 2 (TWO) TIMES A DAY WITH MEALS.  1  . oxyCODONE-acetaminophen (PERCOCET/ROXICET) 5-325 MG tablet Take 1 tablet by mouth every 6 (six) hours as needed for severe pain. 15 tablet 0  . oxyCODONE-acetaminophen (ROXICET) 5-325 MG tablet Take 1 tablet by mouth every 6 (six) hours as needed for severe pain. 15 tablet 0  . simvastatin (ZOCOR) 20 MG tablet Take 20 mg by mouth daily.    Marland Kitchen sulfamethoxazole-trimethoprim (BACTRIM DS,SEPTRA DS) 800-160 MG tablet Take 1 tablet by mouth 2 (two) times daily. 28 tablet 0   No current facility-administered medications on file prior to visit.     Allergies  Allergen Reactions  . Hydromorphone Nausea And Vomiting  . Nickel Rash     Objective: There were no vitals filed for this visit.  General: No acute distress, AAOx3  Left foot: Incision site healing well however centrally there is a small 0.3x0.2x0.2 cm opening centrally with mild fibrotic slough area present that appears to be improving since last week, no active drainage no warmth no redness or any other acute signs of infection noted, Capillary fill time intact to flaps, protective sensation diminished left foot. No pain or crepitation with range of motion left partial foot, s/p TMA.  No pain with calf compression.   Assessment and Plan:  Problem List Items Addressed This Visit    None    Visit Diagnoses    S/P foot surgery, left    -  Primary   History of transmetatarsal amputation of left foot ([place])       Diabetes mellitus due to underlying condition with diabetic polyneuropathy, unspecified whether long term insulin use ([place])          -Patient seen and evaluated -Surgical incision line was debrided and applied a small amount of Prisma dressing covered with dry dressing for patient to keep clean dry and intact to help the central aspect continue to heal like previous -Patient will benefit from custom insole with toe filler, patient to see Betha next week. -Advised patient to limit activity to necessity for ADLs  -Patient is unable to work due to physical impairment  of the left foot due to his partial foot amputation status as previously documented -Will plan for surgical site check/wound care at next visit.  In the meantime, patient to call office if any issues or problems arise.   [name], DPM

## 2018-01-03 ENCOUNTER — Encounter: Payer: Self-pay | Admitting: Sports Medicine

## 2018-01-03 ENCOUNTER — Ambulatory Visit: Payer: Self-pay | Admitting: *Deleted

## 2018-01-03 ENCOUNTER — Ambulatory Visit (INDEPENDENT_AMBULATORY_CARE_PROVIDER_SITE_OTHER): Payer: Self-pay | Admitting: Sports Medicine

## 2018-01-03 VITALS — BP 148/99 | HR 90 | Temp 97.3°F | Resp 16

## 2018-01-03 DIAGNOSIS — Z89432 Acquired absence of left foot: Secondary | ICD-10-CM

## 2018-01-03 DIAGNOSIS — E0842 Diabetes mellitus due to underlying condition with diabetic polyneuropathy: Secondary | ICD-10-CM

## 2018-01-03 DIAGNOSIS — Z9889 Other specified postprocedural states: Secondary | ICD-10-CM

## 2018-01-03 NOTE — Progress Notes (Signed)
Subjective: Jorge Higgins is a 42 y.o. male patient seen today in office for POV #10 (DOS 10-18-17), S/P L TMA. Patient admits to continued nerve related pain but otherwise is doing good. Patient denies calf pain, denies headache, chest pain, shortness of breath, fever, or chills. FBS this AM was 150, A1c 6 and saw PCP Dr. Redmond Pulling last week. No other issues noted.   Patient Active Problem List   Diagnosis Date Noted  . Osteomyelitis ([place]) 09/19/2016  . Leukocytosis 09/19/2016  . Chronic osteomyelitis of left foot ([place]) 08/17/2016  . Osteomyelitis of left foot ([place]) 08/17/2016  . Hypertension   . Diabetes mellitus without complication ([place])   . Tobacco abuse     Current Outpatient Medications on File Prior to Visit  Medication Sig Dispense Refill  . amLODipine (NORVASC) 5 MG tablet Take 1 tablet (5 mg total) by mouth daily. 30 tablet 2  . gentamicin cream (GARAMYCIN) 0.1 % Apply 1 application topically 3 (three) times daily. 15 g 1  . glyBURIDE (DIABETA) 5 MG tablet Take 5 mg by mouth 2 (two) times daily with a meal.    . lisinopril (PRINIVIL,ZESTRIL) 20 MG tablet Take 1 tablet (20 mg total) by mouth daily. 30 tablet 2  . metFORMIN (GLUCOPHAGE) 500 MG tablet TAKE 1 TABLET (500 MG TOTAL) BY MOUTH 2 (TWO) TIMES A DAY WITH MEALS.  1  . oxyCODONE-acetaminophen (PERCOCET/ROXICET) 5-325 MG tablet Take 1 tablet by mouth every 6 (six) hours as needed for severe pain. 15 tablet 0  . oxyCODONE-acetaminophen (ROXICET) 5-325 MG tablet Take 1 tablet by mouth every 6 (six) hours as needed for severe pain. 15 tablet 0  . simvastatin (ZOCOR) 20 MG tablet Take 20 mg by mouth daily.    Marland Kitchen sulfamethoxazole-trimethoprim (BACTRIM DS,SEPTRA DS) 800-160 MG tablet Take 1 tablet by mouth 2 (two) times daily. 28 tablet 0   No current facility-administered medications on file prior to visit.     Allergies  Allergen Reactions  . Hydromorphone Nausea And Vomiting  . Nickel Rash    Objective: There were no  vitals filed for this visit.  General: No acute distress, AAOx3  Left foot: Incision site healing well with no residual opening, no active drainage no warmth no redness or any other acute signs of infection noted, Capillary fill time intact to flaps, protective sensation diminished left foot. No pain or crepitation with range of motion left partial foot, s/p TMA.  No pain with calf compression.   Assessment and Plan:  Problem List Items Addressed This Visit    None    Visit Diagnoses    S/P foot surgery, left    -  Primary   History of transmetatarsal amputation of left foot ([place])       Diabetes mellitus due to underlying condition with diabetic polyneuropathy, unspecified whether long term insulin use ([place])         -Patient seen and evaluated -Applied a dry protective dressing to prevent shear when in tennis shoe to the amputation site on the left -Patient will benefit from custom insole with toe filler, patient was seen today by Benjie Karvonen and was molded for custom insole -Advised patient to limit activity to necessity for ADLs  -Patient is unable to work due to physical impairment of the left foot due to his partial foot amputation status as previously documented -Will plan for surgical site check at next visit and dispensing of custom insoles when ready.  In the meantime, patient to call office  if any issues or problems arise.   [name], DPM

## 2018-01-11 ENCOUNTER — Encounter: Payer: Self-pay | Admitting: Sports Medicine

## 2018-01-11 ENCOUNTER — Ambulatory Visit (INDEPENDENT_AMBULATORY_CARE_PROVIDER_SITE_OTHER): Payer: Self-pay | Admitting: Sports Medicine

## 2018-01-11 VITALS — BP 123/104 | HR 97 | Temp 96.9°F | Resp 16

## 2018-01-11 DIAGNOSIS — Z9889 Other specified postprocedural states: Secondary | ICD-10-CM

## 2018-01-11 DIAGNOSIS — E0842 Diabetes mellitus due to underlying condition with diabetic polyneuropathy: Secondary | ICD-10-CM

## 2018-01-11 DIAGNOSIS — Z89432 Acquired absence of left foot: Secondary | ICD-10-CM

## 2018-01-11 NOTE — Progress Notes (Signed)
Subjective: Jorge Higgins is a 42 y.o. male patient seen today in office for POV #11 (DOS 10-18-17), S/P L TMA. Patient states he is doing good no issues. Patient denies calf pain, denies headache, chest pain, shortness of breath, fever, or chills today but had 1 episode yesterday. FBS this AM was 130, A1c 6 and saw PCP Dr. Redmond Pulling 3 weeks ago. No other issues noted.   Patient Active Problem List   Diagnosis Date Noted  . Osteomyelitis ([place]) 09/19/2016  . Leukocytosis 09/19/2016  . Chronic osteomyelitis of left foot ([place]) 08/17/2016  . Osteomyelitis of left foot ([place]) 08/17/2016  . Hypertension   . Diabetes mellitus without complication ([place])   . Tobacco abuse     Current Outpatient Medications on File Prior to Visit  Medication Sig Dispense Refill  . amLODipine (NORVASC) 5 MG tablet Take 1 tablet (5 mg total) by mouth daily. 30 tablet 2  . gentamicin cream (GARAMYCIN) 0.1 % Apply 1 application topically 3 (three) times daily. 15 g 1  . glyBURIDE (DIABETA) 5 MG tablet Take 5 mg by mouth 2 (two) times daily with a meal.    . lisinopril (PRINIVIL,ZESTRIL) 20 MG tablet Take 1 tablet (20 mg total) by mouth daily. 30 tablet 2  . metFORMIN (GLUCOPHAGE) 500 MG tablet TAKE 1 TABLET (500 MG TOTAL) BY MOUTH 2 (TWO) TIMES A DAY WITH MEALS.  1  . oxyCODONE-acetaminophen (PERCOCET/ROXICET) 5-325 MG tablet Take 1 tablet by mouth every 6 (six) hours as needed for severe pain. 15 tablet 0  . oxyCODONE-acetaminophen (ROXICET) 5-325 MG tablet Take 1 tablet by mouth every 6 (six) hours as needed for severe pain. 15 tablet 0  . simvastatin (ZOCOR) 20 MG tablet Take 20 mg by mouth daily.    Marland Kitchen sulfamethoxazole-trimethoprim (BACTRIM DS,SEPTRA DS) 800-160 MG tablet Take 1 tablet by mouth 2 (two) times daily. 28 tablet 0   No current facility-administered medications on file prior to visit.     Allergies  Allergen Reactions  . Hydromorphone Nausea And Vomiting  . Nickel Rash    Objective: There were  no vitals filed for this visit.  General: No acute distress, AAOx3  Left foot: Incision site remains healed, no active drainage no warmth no redness or any other acute signs of infection noted, Capillary fill time intact to flaps, protective sensation diminished left foot. No pain or crepitation with range of motion left partial foot, s/p TMA.  No pain with calf compression.   Assessment and Plan:  Problem List Items Addressed This Visit    None    Visit Diagnoses    S/P foot surgery, left    -  Primary   History of transmetatarsal amputation of left foot ([place])       Diabetes mellitus due to underlying condition with diabetic polyneuropathy, unspecified whether long term insulin use ([place])         -Patient seen and evaluated -Applied a dry protective dressing to prevent shear when in tennis shoe to the amputation site on the left -Patient to continue with a protective dressing until he gets his custom offloading insole -Patient is unable to work due to physical impairment of the left foot due to his partial foot amputation status as previously documented -Will plan for surgical site check at next visit and dispensing of custom insoles when ready.  In the meantime, patient to call office if any issues or problems arise.   [name], DPM

## 2018-01-17 ENCOUNTER — Encounter: Payer: Self-pay | Admitting: Sports Medicine

## 2018-01-24 ENCOUNTER — Ambulatory Visit (INDEPENDENT_AMBULATORY_CARE_PROVIDER_SITE_OTHER): Payer: Self-pay | Admitting: Sports Medicine

## 2018-01-24 ENCOUNTER — Encounter: Payer: Self-pay | Admitting: Sports Medicine

## 2018-01-24 VITALS — BP 165/109 | HR 93 | Temp 97.6°F | Resp 16

## 2018-01-24 DIAGNOSIS — Z9889 Other specified postprocedural states: Secondary | ICD-10-CM

## 2018-01-24 DIAGNOSIS — Z89432 Acquired absence of left foot: Secondary | ICD-10-CM

## 2018-01-24 DIAGNOSIS — E0842 Diabetes mellitus due to underlying condition with diabetic polyneuropathy: Secondary | ICD-10-CM

## 2018-01-24 NOTE — Progress Notes (Signed)
Subjective: Jorge Higgins is a 42 y.o. male patient seen today in office for POV #12 (DOS 10-18-17), S/P L TMA and for pickup of diabetic shoes with custom insole. Patient states he is doing good besides the current nerve pain that is still there however manageable. Patient denies calf pain, denies headache, chest pain, shortness of breath, fever, or chills.  FBS this AM was 140, A1c 6 and saw PCP Dr. Redmond Pulling 4 weeks ago. No other issues noted.   Patient Active Problem List   Diagnosis Date Noted  . Osteomyelitis ([place]) 09/19/2016  . Leukocytosis 09/19/2016  . Chronic osteomyelitis of left foot ([place]) 08/17/2016  . Osteomyelitis of left foot ([place]) 08/17/2016  . Hypertension   . Diabetes mellitus without complication ([place])   . Tobacco abuse     Current Outpatient Medications on File Prior to Visit  Medication Sig Dispense Refill  . amLODipine (NORVASC) 5 MG tablet Take 1 tablet (5 mg total) by mouth daily. 30 tablet 2  . gentamicin cream (GARAMYCIN) 0.1 % Apply 1 application topically 3 (three) times daily. 15 g 1  . glyBURIDE (DIABETA) 5 MG tablet Take 5 mg by mouth 2 (two) times daily with a meal.    . lisinopril (PRINIVIL,ZESTRIL) 20 MG tablet Take 1 tablet (20 mg total) by mouth daily. 30 tablet 2  . metFORMIN (GLUCOPHAGE) 500 MG tablet TAKE 1 TABLET (500 MG TOTAL) BY MOUTH 2 (TWO) TIMES A DAY WITH MEALS.  1  . oxyCODONE-acetaminophen (PERCOCET/ROXICET) 5-325 MG tablet Take 1 tablet by mouth every 6 (six) hours as needed for severe pain. 15 tablet 0  . oxyCODONE-acetaminophen (ROXICET) 5-325 MG tablet Take 1 tablet by mouth every 6 (six) hours as needed for severe pain. 15 tablet 0  . simvastatin (ZOCOR) 20 MG tablet Take 20 mg by mouth daily.    Marland Kitchen sulfamethoxazole-trimethoprim (BACTRIM DS,SEPTRA DS) 800-160 MG tablet Take 1 tablet by mouth 2 (two) times daily. 28 tablet 0   No current facility-administered medications on file prior to visit.     Allergies  Allergen Reactions  .  Hydromorphone Nausea And Vomiting  . Nickel Rash    Objective: There were no vitals filed for this visit.  General: No acute distress, AAOx3  Left foot: Incision site well-healed, no active drainage no warmth no redness or any other acute signs of infection noted, Capillary fill time intact to flaps, protective sensation diminished left foot with continued nerve pain. No pain or crepitation with range of motion left partial foot, s/p TMA.  No pain with calf compression.   Assessment and Plan:  Problem List Items Addressed This Visit    None    Visit Diagnoses    S/P foot surgery, left    -  Primary   History of transmetatarsal amputation of left foot ([place])       Diabetes mellitus due to underlying condition with diabetic polyneuropathy, unspecified whether long term insulin use ([place])         -Patient seen and evaluated -Dispensed diabetic shoes with custom forefoot toe filler for left foot and advised patient to break them in slowly as instructed and when he is going back to other shoes to protect the stump using some sterile gauze dressing until patient can tolerate his custom shoe and insole for 1 week -Will plan for shoe check in 2 weeks or sooner if problems or issues arise.[name], DPM

## 2018-01-31 ENCOUNTER — Encounter: Payer: Self-pay | Admitting: Sports Medicine

## 2018-02-07 ENCOUNTER — Encounter: Payer: Self-pay | Admitting: Sports Medicine

## 2018-02-08 ENCOUNTER — Encounter: Payer: Self-pay | Admitting: Sports Medicine

## 2018-02-13 ENCOUNTER — Encounter: Payer: Self-pay | Admitting: Sports Medicine

## 2018-02-13 NOTE — Progress Notes (Signed)
Patient ID: Jorge Higgins, male   DOB: [birth year], 42 y.o.   MRN: [id number]   Patient presents at Dr Leeanne Rio request to be measured for diabetic shoes and custom molded insert with forefoot filler on left with [facility] Certified Pedorthist.  Patient will be called when shoes and inserts arrive to schedule a fitting.   Per Dr. Cannon Kettle this will be a charity case.

## 2018-02-22 ENCOUNTER — Encounter: Payer: Self-pay | Admitting: Sports Medicine

## 2018-02-22 ENCOUNTER — Ambulatory Visit (INDEPENDENT_AMBULATORY_CARE_PROVIDER_SITE_OTHER): Payer: Medicaid Other | Admitting: Sports Medicine

## 2018-02-22 VITALS — BP 197/112 | HR 91 | Temp 97.3°F | Resp 16

## 2018-02-22 DIAGNOSIS — Z89432 Acquired absence of left foot: Secondary | ICD-10-CM

## 2018-02-22 DIAGNOSIS — Z9889 Other specified postprocedural states: Secondary | ICD-10-CM

## 2018-02-22 DIAGNOSIS — E0842 Diabetes mellitus due to underlying condition with diabetic polyneuropathy: Secondary | ICD-10-CM

## 2018-02-22 NOTE — Progress Notes (Signed)
Subjective: Jorge Higgins is a 42 y.o. male patient seen today in office for POV #13 (DOS 10-18-17), S/P L TMA and for follow-up to see how patient is doing with diabetic shoes with custom insole. Patient states he is doing good but sometimes notices it does get a little swelling depending on how much he is on his foot but no pain at all.  Patient denies calf pain, denies headache, chest pain, shortness of breath, fever, or chills.  FBS this AM was 150, A1c 6 and saw PCP Dr. Redmond Pulling 8 weeks ago. No other issues noted.   Patient Active Problem List   Diagnosis Date Noted  . Osteomyelitis ([place]) 09/19/2016  . Leukocytosis 09/19/2016  . Chronic osteomyelitis of left foot ([place]) 08/17/2016  . Osteomyelitis of left foot ([place]) 08/17/2016  . Hypertension   . Diabetes mellitus without complication ([place])   . Tobacco abuse     Current Outpatient Medications on File Prior to Visit  Medication Sig Dispense Refill  . amLODipine (NORVASC) 5 MG tablet Take 1 tablet (5 mg total) by mouth daily. 30 tablet 2  . gentamicin cream (GARAMYCIN) 0.1 % Apply 1 application topically 3 (three) times daily. 15 g 1  . glyBURIDE (DIABETA) 5 MG tablet Take 5 mg by mouth 2 (two) times daily with a meal.    . lisinopril (PRINIVIL,ZESTRIL) 20 MG tablet Take 1 tablet (20 mg total) by mouth daily. 30 tablet 2  . metFORMIN (GLUCOPHAGE) 500 MG tablet TAKE 1 TABLET (500 MG TOTAL) BY MOUTH 2 (TWO) TIMES A DAY WITH MEALS.  1  . oxyCODONE-acetaminophen (PERCOCET/ROXICET) 5-325 MG tablet Take 1 tablet by mouth every 6 (six) hours as needed for severe pain. 15 tablet 0  . oxyCODONE-acetaminophen (ROXICET) 5-325 MG tablet Take 1 tablet by mouth every 6 (six) hours as needed for severe pain. 15 tablet 0  . simvastatin (ZOCOR) 20 MG tablet Take 20 mg by mouth daily.    Marland Kitchen sulfamethoxazole-trimethoprim (BACTRIM DS,SEPTRA DS) 800-160 MG tablet Take 1 tablet by mouth 2 (two) times daily. 28 tablet 0   No current facility-administered  medications on file prior to visit.     Allergies  Allergen Reactions  . Hydromorphone Nausea And Vomiting  . Nickel Rash    Objective: There were no vitals filed for this visit.  General: No acute distress, AAOx3  Left foot: Incision site well-healed, no active drainage no warmth no redness or any other acute signs of infection noted, Capillary fill time intact to flaps, protective sensation diminished left foot secondary to neuropathy. No pain or crepitation with range of motion left partial foot, s/p TMA.  No pain with calf compression.   Assessment and Plan:  Problem List Items Addressed This Visit    None    Visit Diagnoses    S/P foot surgery, left    -  Primary   History of transmetatarsal amputation of left foot ([place])       Diabetes mellitus due to underlying condition with diabetic polyneuropathy, unspecified whether long term insulin use ([place])         -Patient seen and evaluated -Continue with diabetic shoes with custom insole -Advised patient to take his blood pressure medication and to recheck his blood pressure if continues to be high to ER immediately -Otherwise patient to return office as scheduled for routine diabetic foot check in 3 months or sooner if problems or issues arise.[name], DPM

## 2018-05-24 ENCOUNTER — Ambulatory Visit: Payer: Medicaid Other | Admitting: Sports Medicine

## 2019-03-14 ENCOUNTER — Other Ambulatory Visit: Payer: Self-pay | Admitting: Sports Medicine

## 2019-03-14 ENCOUNTER — Encounter: Payer: Self-pay | Admitting: Sports Medicine

## 2019-03-14 ENCOUNTER — Other Ambulatory Visit: Payer: Self-pay

## 2019-03-14 ENCOUNTER — Ambulatory Visit (INDEPENDENT_AMBULATORY_CARE_PROVIDER_SITE_OTHER): Payer: Self-pay

## 2019-03-14 ENCOUNTER — Ambulatory Visit (INDEPENDENT_AMBULATORY_CARE_PROVIDER_SITE_OTHER): Payer: Self-pay | Admitting: Sports Medicine

## 2019-03-14 DIAGNOSIS — L97521 Non-pressure chronic ulcer of other part of left foot limited to breakdown of skin: Secondary | ICD-10-CM

## 2019-03-14 DIAGNOSIS — E0842 Diabetes mellitus due to underlying condition with diabetic polyneuropathy: Secondary | ICD-10-CM

## 2019-03-14 DIAGNOSIS — E11621 Type 2 diabetes mellitus with foot ulcer: Secondary | ICD-10-CM

## 2019-03-14 DIAGNOSIS — Z89432 Acquired absence of left foot: Secondary | ICD-10-CM

## 2019-03-14 DIAGNOSIS — Z9889 Other specified postprocedural states: Secondary | ICD-10-CM

## 2019-03-14 NOTE — Progress Notes (Signed)
Subjective: Jorge Higgins is a 43 y.o. male patient seen today in office for wound at amputation stump that he noticed a few days ago reports that he came immediately this time because he was concerned that the small little opening would not heal since he had a history of ulcerations before and has already had a amputation of his foot.  Patient reports that he thinks that inside his shoe there was a small pebble that was rubbing on the amputation stump that caused the wound in the first place denies any significant drainage redness warmth swelling does admit to a spot or 2 of blood and has been putting antibiotic cream and also went on the Internet and ordered Medihoney of which he is waiting to receive.  Patient reports his blood sugar was 100 and denies any changes with medical history since last encounter.  No other issues noted.   Patient Active Problem List   Diagnosis Date Noted  . Osteomyelitis ([place]) 09/19/2016  . Leukocytosis 09/19/2016  . Chronic osteomyelitis of left foot ([place]) 08/17/2016  . Osteomyelitis of left foot ([place]) 08/17/2016  . Hypertension   . Diabetes mellitus without complication ([place])   . Tobacco abuse     Current Outpatient Medications on File Prior to Visit  Medication Sig Dispense Refill  . amLODipine (NORVASC) 5 MG tablet Take 1 tablet (5 mg total) by mouth daily. 30 tablet 2  . gentamicin cream (GARAMYCIN) 0.1 % Apply 1 application topically 3 (three) times daily. 15 g 1  . glyBURIDE (DIABETA) 5 MG tablet Take 5 mg by mouth 2 (two) times daily with a meal.    . lisinopril (PRINIVIL,ZESTRIL) 20 MG tablet Take 1 tablet (20 mg total) by mouth daily. 30 tablet 2  . metFORMIN (GLUCOPHAGE) 500 MG tablet TAKE 1 TABLET (500 MG TOTAL) BY MOUTH 2 (TWO) TIMES A DAY WITH MEALS.  1  . oxyCODONE-acetaminophen (PERCOCET/ROXICET) 5-325 MG tablet Take 1 tablet by mouth every 6 (six) hours as needed for severe pain. 15 tablet 0  . oxyCODONE-acetaminophen (ROXICET) 5-325 MG  tablet Take 1 tablet by mouth every 6 (six) hours as needed for severe pain. 15 tablet 0  . simvastatin (ZOCOR) 20 MG tablet Take 20 mg by mouth daily.    Marland Kitchen sulfamethoxazole-trimethoprim (BACTRIM DS,SEPTRA DS) 800-160 MG tablet Take 1 tablet by mouth 2 (two) times daily. 28 tablet 0   No current facility-administered medications on file prior to visit.    Allergies  Allergen Reactions  . Hydromorphone Nausea And Vomiting  . Nickel Rash    Objective: There were no vitals filed for this visit.  General: No acute distress, AAOx3  Left foot: There is a small partial-thickness ulcer noted at the plantar lateral aspect of the left amputation stump the ulceration measures 0.8 x 0.2 cm and appears to be superficial with a granular base there is no surrounding erythema margins are mildly keratotic and appears to have no acute signs of infection, capillary fill time intact to flaps, protective sensation diminished left foot secondary to neuropathy. No pain or crepitation with range of motion left partial foot, s/p TMA.  No pain with calf compression.   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, limited to breakdown of skin ([place])    -  Primary   S/P foot surgery, left       History of transmetatarsal amputation of left foot (H[facility])       Diabetes mellitus due to underlying condition with diabetic polyneuropathy, unspecified whether long term insulin use ([place])         -Patient seen and evaluated -X-rays reviewed -Discussed with patient diabetic wound care -Mechanically debrided ulceration at left plantar stump to healthy bleeding margins using a sterile chisel blade hemostasis was achieved with manual pressure patient tolerated procedure  well without need for anesthesia and applied Medihoney and dry dressing advised patient to do the same -Continue with diabetic shoes with custom insole however today he did not have them on and advised patient to be very cautious and check shoes to make sure there are no foreign objects inside his shoes that he could be walking on it not feel it -Advised patient to call office if wound or symptoms worsen for possible oral antibiotics however at this time wound was so minimal did not start patient on antibiotics -Return to office in 2 weeks for follow-up wound care or sooner or issues arise.  [name], DPM

## 2019-03-26 ENCOUNTER — Ambulatory Visit: Payer: Self-pay | Admitting: Sports Medicine

## 2019-03-31 DIAGNOSIS — E1169 Type 2 diabetes mellitus with other specified complication: Secondary | ICD-10-CM | POA: Insufficient documentation

## 2019-03-31 DIAGNOSIS — S91302A Unspecified open wound, left foot, initial encounter: Secondary | ICD-10-CM | POA: Insufficient documentation

## 2019-04-04 DIAGNOSIS — Z9289 Personal history of other medical treatment: Secondary | ICD-10-CM

## 2019-04-04 DIAGNOSIS — H3500 Unspecified background retinopathy: Secondary | ICD-10-CM

## 2019-04-04 DIAGNOSIS — Z7689 Persons encountering health services in other specified circumstances: Secondary | ICD-10-CM

## 2019-04-04 HISTORY — DX: Persons encountering health services in other specified circumstances: Z76.89

## 2019-04-04 HISTORY — DX: Unspecified background retinopathy: H35.00

## 2019-04-04 HISTORY — DX: Personal history of other medical treatment: Z92.89

## 2019-04-11 ENCOUNTER — Ambulatory Visit: Payer: Self-pay | Admitting: Sports Medicine

## 2019-04-20 DIAGNOSIS — N179 Acute kidney failure, unspecified: Secondary | ICD-10-CM

## 2019-04-20 DIAGNOSIS — L03116 Cellulitis of left lower limb: Secondary | ICD-10-CM

## 2019-04-20 DIAGNOSIS — E11621 Type 2 diabetes mellitus with foot ulcer: Secondary | ICD-10-CM

## 2019-04-20 DIAGNOSIS — M869 Osteomyelitis, unspecified: Secondary | ICD-10-CM

## 2019-04-20 DIAGNOSIS — A419 Sepsis, unspecified organism: Secondary | ICD-10-CM

## 2019-04-20 DIAGNOSIS — M86671 Other chronic osteomyelitis, right ankle and foot: Secondary | ICD-10-CM

## 2019-04-21 DIAGNOSIS — M86671 Other chronic osteomyelitis, right ankle and foot: Secondary | ICD-10-CM

## 2019-04-28 ENCOUNTER — Other Ambulatory Visit: Payer: Self-pay | Admitting: Podiatry

## 2019-04-28 ENCOUNTER — Other Ambulatory Visit: Payer: Self-pay

## 2019-04-28 ENCOUNTER — Ambulatory Visit (INDEPENDENT_AMBULATORY_CARE_PROVIDER_SITE_OTHER): Payer: Self-pay

## 2019-04-28 ENCOUNTER — Ambulatory Visit (INDEPENDENT_AMBULATORY_CARE_PROVIDER_SITE_OTHER): Payer: Self-pay | Admitting: Podiatry

## 2019-04-28 DIAGNOSIS — E13621 Other specified diabetes mellitus with foot ulcer: Secondary | ICD-10-CM

## 2019-04-28 DIAGNOSIS — Z9889 Other specified postprocedural states: Secondary | ICD-10-CM

## 2019-04-28 DIAGNOSIS — L97514 Non-pressure chronic ulcer of other part of right foot with necrosis of bone: Secondary | ICD-10-CM

## 2019-04-28 NOTE — Progress Notes (Signed)
  Subjective:  Patient ID: Jorge Higgins, male    DOB: 1975-05-12,  MRN: [id number]  Chief Complaint  Patient presents with  . Routine Post Op    POV #1 Pt. stats wound vac machine started doing weird noises this morning adn leaking -pt states he took everything off and did a normal dressing change -pt states he has had some pain today 5/10 do to being up a lot today Tx: abx and crutches   44 y.o. male presents for wound care. Hx confirmed with patient.  Objective:  Physical Exam: Wound Location: lateral foot Wound Measurement: 5x1.5 Wound Base: Granular/Healthy Peri-wound: Macerated Exudate: Moderate amount Serosanguinous exudate wound without warmth, erythema, signs of acute infection and appears improved compared to last recheck  No images are attached to the encounter.  Radiographs:  X-ray of the right foot: consistent with post-op state no new osseous erosions, abx spacer intact.  Assessment:  No diagnosis found.  Plan:  Patient was evaluated and treated and all questions answered.  Ulcer Lateral Foot s/p 5th ray and cuboid excision -XR reviewed with patient -Wound VAC reapplied -Wound without signs of infection. -Continue PO Abx -Discussed with patient we can continue salvage option but he still is at high risk of failure and at risk for possible future amputation.   Procedure: Wound VAC Application Location: lateral foot  Wound Measurement: 5 cm x 1.5 cm x 1 cm  Technique: Black foam to wound base, followed by adherent dressing. Set to 125 mmHg with good seal noted. Disposition: Patient tolerated procedure well.

## 2019-04-29 ENCOUNTER — Other Ambulatory Visit: Payer: Self-pay | Admitting: Podiatry

## 2019-04-29 DIAGNOSIS — L97514 Non-pressure chronic ulcer of other part of right foot with necrosis of bone: Secondary | ICD-10-CM

## 2019-04-29 DIAGNOSIS — E13621 Other specified diabetes mellitus with foot ulcer: Secondary | ICD-10-CM

## 2019-05-01 ENCOUNTER — Ambulatory Visit: Payer: Medicaid Other | Admitting: Podiatry

## 2019-05-01 ENCOUNTER — Telehealth: Payer: Self-pay

## 2019-05-01 NOTE — Telephone Encounter (Signed)
Pt was called to discuss why he canceled his appointment and to discuss his wound vac. Pt stated cancelled due to having blurry vision and not being able to get transportation. Pt was advised to remove wound vac, pt stated his girlfriend who is a CNA has been able to change it for him. Pt states his wound vac is currently working normally and he would be at his next scheduled appointment.

## 2019-05-05 ENCOUNTER — Other Ambulatory Visit: Payer: Self-pay

## 2019-05-05 ENCOUNTER — Ambulatory Visit (INDEPENDENT_AMBULATORY_CARE_PROVIDER_SITE_OTHER): Payer: Self-pay | Admitting: Podiatry

## 2019-05-05 DIAGNOSIS — E13621 Other specified diabetes mellitus with foot ulcer: Secondary | ICD-10-CM

## 2019-05-05 DIAGNOSIS — L97514 Non-pressure chronic ulcer of other part of right foot with necrosis of bone: Secondary | ICD-10-CM

## 2019-05-05 NOTE — Progress Notes (Signed)
  Subjective:  Patient ID: Jorge Higgins, male    DOB: [birth year],  MRN: [id number]  No chief complaint on file.  44 y.o. male presents for wound care. States his fiancee changed the VAC and he canceled his appt with me last week for change because he could not make it to the office and felt comfortable with her doing it. States the [facility] had seal leak issues. Still with chills and nausea but relates this to his other medications. Complains of pain at medial ankle, radiating up the leg, needle stick type pain. Objective:  Physical Exam: Wound Location: lateral foot Wound Measurement: 5x1.5 Wound Base: Granular/Healthy Peri-wound: Severely Macerated Exudate: Moderate amount Serosanguinous exudate wound without warmth, erythema, signs of acute infection and appears improved compared to last recheck  Assessment:   1. 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  Ulcer Lateral Foot s/p 5th ray and cuboid excision -Unable to apply wound VAC today 2/2 maceration -Discussed importance of having me change the wound VAC. I do not think his fiancee is experienced enough to do so safely and the wound was very macerated from seal leak today. -Continue PO abx -F/u this week for wound VAC application.

## 2019-05-08 ENCOUNTER — Ambulatory Visit (INDEPENDENT_AMBULATORY_CARE_PROVIDER_SITE_OTHER): Payer: Medicaid Other | Admitting: Podiatry

## 2019-05-08 ENCOUNTER — Other Ambulatory Visit: Payer: Self-pay

## 2019-05-08 DIAGNOSIS — L97514 Non-pressure chronic ulcer of other part of right foot with necrosis of bone: Secondary | ICD-10-CM | POA: Diagnosis not present

## 2019-05-08 DIAGNOSIS — E13621 Other specified diabetes mellitus with foot ulcer: Secondary | ICD-10-CM

## 2019-05-12 ENCOUNTER — Ambulatory Visit (INDEPENDENT_AMBULATORY_CARE_PROVIDER_SITE_OTHER): Payer: Self-pay | Admitting: Podiatry

## 2019-05-12 ENCOUNTER — Other Ambulatory Visit: Payer: Self-pay

## 2019-05-12 DIAGNOSIS — L97514 Non-pressure chronic ulcer of other part of right foot with necrosis of bone: Secondary | ICD-10-CM

## 2019-05-12 DIAGNOSIS — E13621 Other specified diabetes mellitus with foot ulcer: Secondary | ICD-10-CM

## 2019-05-12 NOTE — Progress Notes (Signed)
  Subjective:  Patient ID: Jorge Higgins, male    DOB: March 15, 1976,  MRN: [id number]  Chief Complaint  Patient presents with  . Wound Check    f/U Rt wound vac change Pt. states," a little tender, but not painful; 2/10." Tx: none -pt's dressing dry clean and intact   . Diabetes    FBS: 110 A1C: unkown     44 y.o. male presents for wound care. Hx confirmed with patient.  Objective:  Physical Exam: Wound Location: right lateral foot Wound Measurement: 5x2.5 Wound Base: Granular/Healthy Peri-wound: Macerated Exudate: Scant/small amount Serosanguinous exudate wound without warmth, erythema, signs of acute infection  Assessment:   1. 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  Ulcer Right foot -Wound cleansed, betadine applied -F/u Thursday for VAC change -Advised to d/c if the [facility] loses seal.  Procedure: Wound VAC Application Location: right lateral foot  Wound Measurement: 5 cm x 2.5 cm x 0.2 cm  Technique: Black foam to wound base, followed by adherent dressing. Set to 125 mmHg with good seal noted. Disposition: Patient tolerated procedure well.

## 2019-05-15 ENCOUNTER — Other Ambulatory Visit: Payer: Self-pay

## 2019-05-15 ENCOUNTER — Ambulatory Visit (INDEPENDENT_AMBULATORY_CARE_PROVIDER_SITE_OTHER): Payer: Medicaid Other | Admitting: Podiatry

## 2019-05-15 DIAGNOSIS — L97514 Non-pressure chronic ulcer of other part of right foot with necrosis of bone: Secondary | ICD-10-CM | POA: Diagnosis not present

## 2019-05-19 ENCOUNTER — Ambulatory Visit: Payer: Medicaid Other | Admitting: Podiatry

## 2019-05-21 ENCOUNTER — Other Ambulatory Visit: Payer: Self-pay

## 2019-05-21 ENCOUNTER — Ambulatory Visit (INDEPENDENT_AMBULATORY_CARE_PROVIDER_SITE_OTHER): Payer: Self-pay | Admitting: Sports Medicine

## 2019-05-21 ENCOUNTER — Encounter: Payer: Self-pay | Admitting: Sports Medicine

## 2019-05-21 DIAGNOSIS — E13621 Other specified diabetes mellitus with foot ulcer: Secondary | ICD-10-CM

## 2019-05-21 DIAGNOSIS — M79671 Pain in right foot: Secondary | ICD-10-CM

## 2019-05-21 DIAGNOSIS — L97514 Non-pressure chronic ulcer of other part of right foot with necrosis of bone: Secondary | ICD-10-CM

## 2019-05-21 DIAGNOSIS — Z89432 Acquired absence of left foot: Secondary | ICD-10-CM

## 2019-05-21 DIAGNOSIS — Z9889 Other specified postprocedural states: Secondary | ICD-10-CM

## 2019-05-21 MED ORDER — PROMETHAZINE HCL 25 MG PO TABS: 25.0000 mg | ORAL_TABLET | Freq: Three times a day (TID) | ORAL | 0 refills | Status: DC | PRN

## 2019-05-21 NOTE — Progress Notes (Signed)
Subjective: Jorge Higgins is a 44 y.o. male patient seen today in office for wound check patient is status post right foot incision and drainage with removal of metatarsal performed by Dr. March Rummage.  Patient reports that Dr. March Rummage on last visit discontinued the wound VAC because the area was getting very soft and moist and reports that he has been changing the area using dry dressing every day because there is some drainage reports that drainage has been less and he feels like his foot feels better since the wound VAC has been removed.  Patient denies vomiting fever but does admit to a few episodes of nausea and chills and is not sure if that is related to his diabetes.  Patient reports that he has completed all antibiotics. No other issues noted.   Patient Active Problem List   Diagnosis Date Noted  . Mixed diabetic hyperlipidemia associated with type 2 diabetes mellitus ([place]) 03/31/2019  . Open wound of left foot 03/31/2019  . ED (erectile dysfunction) 10/06/2016  . Osteomyelitis ([place]) 09/19/2016  . Leukocytosis 09/19/2016  . Chronic osteomyelitis of left foot ([place]) 08/17/2016  . Osteomyelitis of left foot ([place]) 08/17/2016  . Hypertension   . Diabetes mellitus without complication ([place])   . Tobacco abuse   . Toe amputation status 12/15/2015  . Cellulitis 10/14/2015  . Diabetic ulcer of left foot associated with type 2 diabetes mellitus ([place]) 10/06/2015  . Refused pneumococcal vaccine 07/10/2013    Current Outpatient Medications on File Prior to Visit  Medication Sig Dispense Refill  . amLODipine (NORVASC) 5 MG tablet Take 1 tablet (5 mg total) by mouth daily. 30 tablet 2  . gentamicin cream (GARAMYCIN) 0.1 % Apply 1 application topically 3 (three) times daily. 15 g 1  . glyBURIDE (DIABETA) 5 MG tablet Take 5 mg by mouth 2 (two) times daily with a meal.    . lisinopril (PRINIVIL,ZESTRIL) 20 MG tablet Take 1 tablet (20 mg total) by mouth daily. 30 tablet 2  . metFORMIN (GLUCOPHAGE) 500  MG tablet TAKE 1 TABLET (500 MG TOTAL) BY MOUTH 2 (TWO) TIMES A DAY WITH MEALS.  1  . oxyCODONE-acetaminophen (PERCOCET/ROXICET) 5-325 MG tablet Take 1 tablet by mouth every 6 (six) hours as needed for severe pain. 15 tablet 0  . oxyCODONE-acetaminophen (ROXICET) 5-325 MG tablet Take 1 tablet by mouth every 6 (six) hours as needed for severe pain. 15 tablet 0  . simvastatin (ZOCOR) 20 MG tablet Take 20 mg by mouth daily.    Marland Kitchen sulfamethoxazole-trimethoprim (BACTRIM DS,SEPTRA DS) 800-160 MG tablet Take 1 tablet by mouth 2 (two) times daily. 28 tablet 0   No current facility-administered medications on file prior to visit.    Allergies  Allergen Reactions  . Hydromorphone Nausea And Vomiting  . Nickel Rash    Objective: There were no vitals filed for this visit.  General: No acute distress, AAOx3  Right foot: Sutures and staples at the distal margin of the wound is intact the wound is located at the right lateral aspect of the foot measures 5x 2.5 cm with a granular base and significant periwound maceration with clear to yellow drainage decreased edema no erythema no warmth or any other signs of infection.   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, with necrosis of bone ([place])    -  Primary   S/P foot surgery, right       Right foot pain       History of transmetatarsal amputation of left foot ([place])           -Patient seen and evaluated -Applied Actisorb or and dry sterile dressing to surgical site right foot -Advised patient to do the same daily until next office visit on Friday -Advised patient to continue with nonweightbearing and use of crutches on right -Advised patient to limit activity to necessity  -Advised patient to ice and elevate as necessary  -Sent promethazine for patient to have for nausea and advised patient to keep good  control of his blood sugars and monitoring symptoms of nausea progresses or if fever presents or other symptoms arise to notify office -Will plan for wound check/dressing change with Dr. March Rummage at next office visit. In the meantime, patient to call office if any issues or problems arise.   Jorge Higgins, DPM

## 2019-05-23 ENCOUNTER — Other Ambulatory Visit: Payer: Self-pay

## 2019-05-23 ENCOUNTER — Ambulatory Visit: Payer: Medicaid Other | Admitting: Podiatry

## 2019-05-23 DIAGNOSIS — L97514 Non-pressure chronic ulcer of other part of right foot with necrosis of bone: Secondary | ICD-10-CM

## 2019-05-23 DIAGNOSIS — E13621 Other specified diabetes mellitus with foot ulcer: Secondary | ICD-10-CM

## 2019-05-27 ENCOUNTER — Ambulatory Visit (INDEPENDENT_AMBULATORY_CARE_PROVIDER_SITE_OTHER): Payer: Self-pay

## 2019-05-27 ENCOUNTER — Other Ambulatory Visit: Payer: Self-pay

## 2019-05-27 ENCOUNTER — Ambulatory Visit (INDEPENDENT_AMBULATORY_CARE_PROVIDER_SITE_OTHER): Payer: Self-pay | Admitting: Podiatry

## 2019-05-27 ENCOUNTER — Other Ambulatory Visit: Payer: Self-pay | Admitting: Podiatry

## 2019-05-27 DIAGNOSIS — M79671 Pain in right foot: Secondary | ICD-10-CM

## 2019-05-27 DIAGNOSIS — M86671 Other chronic osteomyelitis, right ankle and foot: Secondary | ICD-10-CM

## 2019-05-27 MED ORDER — CLINDAMYCIN HCL 150 MG PO CAPS: 150.0000 mg | ORAL_CAPSULE | Freq: Two times a day (BID) | ORAL | 0 refills | Status: DC

## 2019-05-27 NOTE — Progress Notes (Signed)
  Subjective:  Patient ID: Jorge Higgins, male    DOB: 1975-08-22,  MRN: [id number]  Chief Complaint  Patient presents with  . Wound Check    F/U Rt wound check and wound vac Pt. sttes," I feel very weak ( 2 wks), very lightheaded. WIth lots of chills but nausea is better. When I have my foot up it feels fine but when I put it down ther's where I feel it very heavy." xt: elevation   . Diabetes    FBS: 47    44 y.o. male presents for wound care. Hx confirmed with patient. States he continues to feel weak, worsening over the past week. Objective:  Physical Exam: Wound Location: right lateral foot Wound Measurement: 7x3 Wound Base: Mixed Granular/Fibrotic Peri-wound: Macerated Exudate: Moderate amount Serosanguinous exudate wound without warmth, erythema, signs of acute infection  Radiographs:  X-ray of the right foot: new subtle calcaneus erosion antibiotic spacer remains intact  Assessment:   1. Chronic osteomyelitis involving right ankle and foot ([place])    Plan:  Patient was evaluated and treated and all questions answered.  Ulcer Right Foot -XR reviewed with patient -Dressing applied consisting of betadine, sterile gauze, kerlix and ACE bandage  -Plan for surgical debridement and closure next week. -Order CBC, BMP, ESR, CRP given patient's complaints of lightheadedness and weakness and chills. Per patient this is since his hospitalization and his wound remains without signs of infection. This could be more related to his DM. He does have new erosion of the calcaneus bone. We will refill the clindamycin. He was advised that should the wound worsen to present to the ED immediately for evaluation. -Patient has failed all conservative therapy and wishes to proceed with surgical intervention. All risks, benefits, and alternatives discussed with patient. No guarantees given. Consent reviewed and signed by patient. -Planned procedures: right foot wound debridement, closure,  antibiotic spacer exchange  No follow-ups on file.

## 2019-05-27 NOTE — Patient Instructions (Signed)
Pre-Operative Instructions  Congratulations, you have decided to take an important step towards improving your quality of life.  You can be assured that the doctors and staff at [facility] will be with you every step of the way.  Here are some important things you should know:  1. Plan to be at the surgery center/hospital at least 1 (one) hour prior to your scheduled time, unless otherwise directed by the surgical center/hospital staff.  You must have a responsible adult accompany you, remain during the surgery and drive you home.  Make sure you have directions to the surgical center/hospital to ensure you arrive on time. 2. If you are having surgery at [facility] or  hospitals, you will need a copy of your medical history and physical form from your family physician within one month prior to the date of surgery. We will give you a form for your primary physician to complete.  3. We make every effort to accommodate the date you request for surgery.  However, there are times where surgery dates or times have to be moved.  We will contact you as soon as possible if a change in schedule is required.   4. No aspirin/ibuprofen for one week before surgery.  If you are on aspirin, any non-steroidal anti-inflammatory medications (Mobic, Aleve, Ibuprofen) should not be taken seven (7) days prior to your surgery.  You make take Tylenol for pain prior to surgery.  5. Medications - If you are taking daily heart and blood pressure medications, seizure, reflux, allergy, asthma, anxiety, pain or diabetes medications, make sure you notify the surgery center/hospital before the day of surgery so they can tell you which medications you should take or avoid the day of surgery. 6. No food or drink after midnight the night before surgery unless directed otherwise by surgical center/hospital staff. 7. No alcoholic beverages 24-hours prior to surgery.  No smoking 24-hours prior or 24-hours after  surgery. 8. Wear loose pants or shorts. They should be loose enough to fit over bandages, boots, and casts. 9. Don't wear slip-on shoes. Sneakers are preferred. 10. Bring your boot with you to the surgery center/hospital.  Also bring crutches or a walker if your physician has prescribed it for you.  If you do not have this equipment, it will be provided for you after surgery. 11. If you have not been contacted by the surgery center/hospital by the day before your surgery, call to confirm the date and time of your surgery. 12. Leave-time from work may vary depending on the type of surgery you have.  Appropriate arrangements should be made prior to surgery with your employer. 13. Prescriptions will be provided immediately following surgery by your doctor.  Fill these as soon as possible after surgery and take the medication as directed. Pain medications will not be refilled on weekends and must be approved by the doctor. 14. Remove nail polish on the operative foot and avoid getting pedicures prior to surgery. 15. Wash the night before surgery.  The night before surgery wash the foot and leg well with water and the antibacterial soap provided. Be sure to pay special attention to beneath the toenails and in between the toes.  Wash for at least three (3) minutes. Rinse thoroughly with water and dry well with a towel.  Perform this wash unless told not to do so by your physician.  Enclosed: 1 Ice pack (please put in freezer the night before surgery)   1 Hibiclens skin cleaner     Pre-op instructions  If you have any questions regarding the instructions, please do not hesitate to call our office.  [place]: [address], [place], [place] [address] -- [phone]  [place]: [address] [place], [place] [address] -- [phone]  [place]: [address], , [place] [address] -- [phone]   Website: [email or web address] 

## 2019-05-29 ENCOUNTER — Telehealth: Payer: Self-pay | Admitting: *Deleted

## 2019-05-29 NOTE — Telephone Encounter (Signed)
Unable to discuss pt's current problem, voicemail is not set up.

## 2019-05-29 NOTE — Telephone Encounter (Signed)
Pt states he went to the clinic to be signed off for surgery, but fainted, and was sent to the ED.

## 2019-05-30 ENCOUNTER — Telehealth: Payer: Self-pay

## 2019-05-30 NOTE — Telephone Encounter (Signed)
Patient called and stated while he was at the PCP office yesterday he passed out and was taken to the ED. During that time he lost is H&P paperwork and lab orders. I told him to go by the [place] office to pick up new paperwork and see if he could get another appointment with his PCP by Monday.   I spoke to [place] in the [place] office and she will have new paperwork ready for him at the front desk.

## 2019-05-31 LAB — CBC WITH DIFFERENTIAL/PLATELET
Basophils Absolute: 0.1 10*3/uL (ref 0.0–0.2)
Basos: 1 %
EOS (ABSOLUTE): 0.3 10*3/uL (ref 0.0–0.4)
Eos: 3 %
Hematocrit: 28.5 % — ABNORMAL LOW (ref 37.5–51.0)
Hemoglobin: 9.6 g/dL — ABNORMAL LOW (ref 13.0–17.7)
Immature Grans (Abs): 0.1 10*3/uL (ref 0.0–0.1)
Immature Granulocytes: 1 %
Lymphocytes Absolute: 2.6 10*3/uL (ref 0.7–3.1)
Lymphs: 26 %
MCH: 28.2 pg (ref 26.6–33.0)
MCHC: 33.7 g/dL (ref 31.5–35.7)
MCV: 84 fL (ref 79–97)
Monocytes Absolute: 0.5 10*3/uL (ref 0.1–0.9)
Monocytes: 5 %
Neutrophils Absolute: 6.6 10*3/uL (ref 1.4–7.0)
Neutrophils: 64 %
Platelets: 544 10*3/uL — ABNORMAL HIGH (ref 150–450)
RBC: 3.4 x10E6/uL — ABNORMAL LOW (ref 4.14–5.80)
RDW: 11.9 % (ref 11.6–15.4)
WBC: 10.1 10*3/uL (ref 3.4–10.8)

## 2019-05-31 LAB — BASIC METABOLIC PANEL
BUN/Creatinine Ratio: 8 — ABNORMAL LOW (ref 9–20)
BUN: 14 mg/dL (ref 6–24)
CO2: 21 mmol/L (ref 20–29)
Calcium: 8.4 mg/dL — ABNORMAL LOW (ref 8.7–10.2)
Chloride: 98 mmol/L (ref 96–106)
Creatinine, Ser: 1.83 mg/dL — ABNORMAL HIGH (ref 0.76–1.27)
GFR calc Af Amer: 50 mL/min/{1.73_m2} — ABNORMAL LOW (ref >59)
GFR calc non Af Amer: 44 mL/min/{1.73_m2} — ABNORMAL LOW (ref >59)
Glucose: 133 mg/dL — ABNORMAL HIGH (ref 65–99)
Potassium: 4.3 mmol/L (ref 3.5–5.2)
Sodium: 133 mmol/L — ABNORMAL LOW (ref 134–144)

## 2019-05-31 LAB — SEDIMENTATION RATE: Sed Rate: 37 mm/hr — ABNORMAL HIGH (ref 0–15)

## 2019-05-31 LAB — C-REACTIVE PROTEIN: CRP: 40 mg/L — ABNORMAL HIGH (ref 0–10)

## 2019-06-02 ENCOUNTER — Other Ambulatory Visit: Payer: Self-pay | Admitting: Podiatry

## 2019-06-02 DIAGNOSIS — M86671 Other chronic osteomyelitis, right ankle and foot: Secondary | ICD-10-CM

## 2019-06-03 ENCOUNTER — Encounter: Payer: Self-pay | Admitting: Podiatry

## 2019-06-03 DIAGNOSIS — M86671 Other chronic osteomyelitis, right ankle and foot: Secondary | ICD-10-CM

## 2019-06-04 ENCOUNTER — Telehealth: Payer: Self-pay

## 2019-06-04 NOTE — Telephone Encounter (Signed)
POST OP CALL-    1) General condition stated by the patient: Pt states feels great  2) Is the pt having pain? None  3) Pain score:   4) Has the pt taken Rx'd pain medication, regularly or PRN?   5) Is the pain medication giving relief?  6) Any fever, chills, nausea, or vomiting, shortness of breath or tightness in calf? None  7) Is the bandage clean, dry and intact? Wound vac is applied.  8) Is there excessive tightness, bleeding or drainage coming through the bandage? Pt states wound vac appears intact and functioning properly.  9) Did you understand all of the post op instruction sheet given? Yes  10) Any questions or concerns regarding post op care/recovery? None    Confirmed POV appointment with patient

## 2019-06-07 DIAGNOSIS — R55 Syncope and collapse: Secondary | ICD-10-CM

## 2019-06-07 DIAGNOSIS — N189 Chronic kidney disease, unspecified: Secondary | ICD-10-CM

## 2019-06-07 DIAGNOSIS — D638 Anemia in other chronic diseases classified elsewhere: Secondary | ICD-10-CM

## 2019-06-07 DIAGNOSIS — E1165 Type 2 diabetes mellitus with hyperglycemia: Secondary | ICD-10-CM

## 2019-06-07 DIAGNOSIS — M869 Osteomyelitis, unspecified: Secondary | ICD-10-CM

## 2019-06-08 DIAGNOSIS — I951 Orthostatic hypotension: Secondary | ICD-10-CM

## 2019-06-08 DIAGNOSIS — D638 Anemia in other chronic diseases classified elsewhere: Secondary | ICD-10-CM

## 2019-06-08 DIAGNOSIS — E1165 Type 2 diabetes mellitus with hyperglycemia: Secondary | ICD-10-CM

## 2019-06-08 DIAGNOSIS — R55 Syncope and collapse: Secondary | ICD-10-CM

## 2019-06-08 DIAGNOSIS — N179 Acute kidney failure, unspecified: Secondary | ICD-10-CM

## 2019-06-10 ENCOUNTER — Other Ambulatory Visit: Payer: Self-pay

## 2019-06-10 ENCOUNTER — Ambulatory Visit (INDEPENDENT_AMBULATORY_CARE_PROVIDER_SITE_OTHER): Payer: Medicaid Other | Admitting: Podiatry

## 2019-06-10 DIAGNOSIS — Z5329 Procedure and treatment not carried out because of patient's decision for other reasons: Secondary | ICD-10-CM

## 2019-06-10 NOTE — Progress Notes (Signed)
No show for post-op appt. Patient currently in hospitalized.

## 2019-06-10 NOTE — Progress Notes (Signed)
  Subjective:  Patient ID: Jorge Higgins, male    DOB: 1976-02-24,  MRN: [id number]  Chief Complaint  Patient presents with  . Wound Check    Pt states improvement, "I can move my foot more now". Denies fever/chills/vomiting. Pt has nausea but states it is being resolved with medication.    44 y.o. male presents for wound care. Hx confirmed with patient.  Objective:  Physical Exam: Wound Location: right lateral foot Wound Measurement: 5x2.5 Wound Base: Granular/Healthy Peri-wound: Macerated Exudate: Scant/small amount Serosanguinous exudate wound without warmth, erythema, signs of acute infection  Assessment:   1. 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  Ulcer Right foot -Wound VAC re-applied  Procedure: Wound VAC Application Location: right foot Wound Measurement: 5 cm x 2.5 cm x 0.3 cm  Technique: Black foam to wound base, followed by adherent dressing. Set to 125 mmHg with good seal noted. Disposition: Patient tolerated procedure well.

## 2019-06-11 ENCOUNTER — Telehealth: Payer: Self-pay

## 2019-06-11 NOTE — Telephone Encounter (Signed)
Dorian Pod from [facility] called requesting wound orders for pt. Please advice

## 2019-06-12 ENCOUNTER — Telehealth: Payer: Self-pay

## 2019-06-12 NOTE — Telephone Encounter (Signed)
Change wound VAC 3x a week.  Cleanse wound, apply betadine to wound and pat dry. Black foam to wound, followed by adherent dressing, track pad. Set to 125 mm Hg. Protect sutures with non-adherent dressing.

## 2019-06-12 NOTE — Telephone Encounter (Signed)
Spoke with Dorian Pod from [facility] and gave new wound care orders for Jobie.

## 2019-06-14 DIAGNOSIS — R55 Syncope and collapse: Secondary | ICD-10-CM

## 2019-06-14 DIAGNOSIS — E871 Hypo-osmolality and hyponatremia: Secondary | ICD-10-CM

## 2019-06-14 DIAGNOSIS — E11621 Type 2 diabetes mellitus with foot ulcer: Secondary | ICD-10-CM

## 2019-06-14 DIAGNOSIS — I951 Orthostatic hypotension: Secondary | ICD-10-CM

## 2019-06-14 DIAGNOSIS — I10 Essential (primary) hypertension: Secondary | ICD-10-CM

## 2019-06-14 DIAGNOSIS — N189 Chronic kidney disease, unspecified: Secondary | ICD-10-CM

## 2019-06-14 DIAGNOSIS — G901 Familial dysautonomia [Riley-Day]: Secondary | ICD-10-CM

## 2019-06-16 ENCOUNTER — Ambulatory Visit (INDEPENDENT_AMBULATORY_CARE_PROVIDER_SITE_OTHER): Payer: Medicaid Other | Admitting: Podiatry

## 2019-06-16 ENCOUNTER — Telehealth: Payer: Self-pay

## 2019-06-16 ENCOUNTER — Other Ambulatory Visit: Payer: Self-pay

## 2019-06-16 DIAGNOSIS — M86671 Other chronic osteomyelitis, right ankle and foot: Secondary | ICD-10-CM

## 2019-06-16 DIAGNOSIS — L97514 Non-pressure chronic ulcer of other part of right foot with necrosis of bone: Secondary | ICD-10-CM

## 2019-06-16 DIAGNOSIS — Z9889 Other specified postprocedural states: Secondary | ICD-10-CM

## 2019-06-16 DIAGNOSIS — E13621 Other specified diabetes mellitus with foot ulcer: Secondary | ICD-10-CM

## 2019-06-16 NOTE — Telephone Encounter (Signed)
Pt called requesting two sepearate notes for his disability and for [place] furniture. Pt states the note should state an estimated release date to return back to work and to specify how long he has been out of work.

## 2019-06-17 NOTE — Progress Notes (Signed)
  Subjective:  Patient ID: Jorge Higgins, male    DOB: [birth year],  MRN: [id number]  No chief complaint on file.   DOS: 06/03/19 Procedure: Wound Debridement Right including Debridement of Calcaneus bone, exchange of antibiotic spacer, wound VAC application  44 y.o. male presents with the above complaint. History confirmed with patient.   Objective:  Physical Exam: tenderness at the surgical site, local edema noted and calf supple, nontender. Incision: healing well, moderate maceration, skin sutures and staples intact with skin edges well coapted. Assessment:   1. Chronic osteomyelitis involving right ankle and foot ([place])   2. 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 Post-operative state     Plan:  Patient was evaluated and treated and all questions answered.  Post-operative State -Wound to macerated for wound VAC to be applied today.  Dressed with Betadine and dry sterile dressing. -Wound VAC to be reapplied by RN -Follow-up next week recheck -Continue abx to completion.  No follow-ups on file.

## 2019-06-17 NOTE — Telephone Encounter (Signed)
We can write that he's been out since his first surgery and that he'll be out of work for at least 6 more weeks

## 2019-06-18 ENCOUNTER — Encounter: Payer: Self-pay | Admitting: Podiatry

## 2019-06-18 DIAGNOSIS — E1169 Type 2 diabetes mellitus with other specified complication: Secondary | ICD-10-CM

## 2019-06-18 DIAGNOSIS — I10 Essential (primary) hypertension: Secondary | ICD-10-CM

## 2019-06-18 DIAGNOSIS — I951 Orthostatic hypotension: Secondary | ICD-10-CM

## 2019-06-18 DIAGNOSIS — E875 Hyperkalemia: Secondary | ICD-10-CM

## 2019-06-18 DIAGNOSIS — N1832 Chronic kidney disease, stage 3b: Secondary | ICD-10-CM

## 2019-06-18 NOTE — Telephone Encounter (Signed)
Caryl Pina, Per Dr. March Rummage for Jorge Higgins's letter can you write that he's been out since his first surgery and that he'll be out of work for at least 6 more weeks, thanks

## 2019-06-19 DIAGNOSIS — I10 Essential (primary) hypertension: Secondary | ICD-10-CM | POA: Diagnosis not present

## 2019-06-19 DIAGNOSIS — N1832 Chronic kidney disease, stage 3b: Secondary | ICD-10-CM | POA: Diagnosis not present

## 2019-06-19 DIAGNOSIS — E875 Hyperkalemia: Secondary | ICD-10-CM | POA: Diagnosis not present

## 2019-06-19 DIAGNOSIS — E1169 Type 2 diabetes mellitus with other specified complication: Secondary | ICD-10-CM | POA: Diagnosis not present

## 2019-06-23 ENCOUNTER — Ambulatory Visit (INDEPENDENT_AMBULATORY_CARE_PROVIDER_SITE_OTHER): Payer: Medicaid Other | Admitting: Podiatry

## 2019-06-23 ENCOUNTER — Other Ambulatory Visit: Payer: Self-pay

## 2019-06-23 DIAGNOSIS — L97514 Non-pressure chronic ulcer of other part of right foot with necrosis of bone: Secondary | ICD-10-CM

## 2019-06-23 DIAGNOSIS — Z9889 Other specified postprocedural states: Secondary | ICD-10-CM

## 2019-06-23 DIAGNOSIS — M86671 Other chronic osteomyelitis, right ankle and foot: Secondary | ICD-10-CM

## 2019-06-23 DIAGNOSIS — E13621 Other specified diabetes mellitus with foot ulcer: Secondary | ICD-10-CM

## 2019-06-23 NOTE — Progress Notes (Signed)
  Subjective:  Patient ID: Brannon Chuck Hint, male    DOB: 01/23/1976,  MRN: [id number]  Chief Complaint  Patient presents with  . Wound Check    F?U Rt wound vac check pt. states," looks and feels much better." -less draiange -pt denies N/V/F?ch Tx: abx    DOS: 06/03/19 Procedure: Wound Debridement Right including Debridement of Calcaneus bone, exchange of antibiotic spacer, wound VAC application  44 y.o. male presents with the above complaint. History confirmed with patient.   Objective:  Physical Exam: tenderness at the surgical site, local edema noted and calf supple, nontender. Incision: healing well, moderate maceration, skin sutures and staples intact with skin edges well coapted. Assessment:   1. Chronic osteomyelitis involving right ankle and foot ([place])   2. 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 Post-operative state     Plan:  Patient was evaluated and treated and all questions answered.  Post-operative State -Wound macerated but does appear to be granulating in. Skin edges loosely coapted. Would benefit from continued VAC therapy. -Continue abx therapy -Nurse to continue [facility] therapy today. -F/u in 1 week for recheck.  No follow-ups on file.

## 2019-06-24 NOTE — Progress Notes (Signed)
Cardiology Office Note:    Date:  06/25/2019   ID:  Jorge Higgins, DOB 10-14-1975, MRN [id number]  PCP:  Coy Saunas, MD  Cardiologist:  Berniece Salines, DO  Electrophysiologist:  None   Referring MD: Coy Saunas, MD   The patient is her to establish cardiovascular care.  History of Present Illness:    Jorge Higgins is a 44 y.o. male with a hx of diabetes type 2, hypertension, hyperlipidemia, diabetic foot ulcer with chronic osteomyelitis of the right foot currently with wound VAC, currently on long-term antibiotics due to his infection, CKD stage III, anemia of chronic disease.  The patient was admitted at the [facility] on March 6 to the 10th at which time he was diagnosed with orthostatic hypotension and dysautonomia.  During that time extensive work-up was performed which included was subsequently negative.  He has subsequently been admitted for observation twice on June 14, 2019 and then again on June 18, 2019.  He is here today for follow-up visit.  He tells me that he still is lightheaded at times when he stands up and reported recent significant nausea with eating and limited vomiting episodes.  No recent syncope episode.  Past Medical History:  Diagnosis Date  . Anemia of chronic disease   . Chronic osteomyelitis ([place])   . CKD (chronic kidney disease)   . Diabetes mellitus without complication ([place])   . Diabetic foot ulcer ([place])   . Hypertension   . Tobacco abuse     Past Surgical History:  Procedure Laterality Date  . AMPUTATION Left 09/19/2016   Procedure: PARTIAL 5th RAY AMPUTATION;  Surgeon: [name], DPM;  Location: [place];  Service: Podiatry;  Laterality: Left;  . FOOT SURGERY Right 04/2019   Took bone out and put in spacer/ 3 surgeries  . TOE AMPUTATION      Current Medications: Current Meds  Medication Sig  . amLODipine (NORVASC) 5 MG tablet Take 1 tablet (5 mg total) by mouth daily.  Marland Kitchen atorvastatin (LIPITOR) 40 MG  tablet Take by mouth.  Marland Kitchen glipiZIDE (GLUCOTROL) 5 MG tablet TAKE 1 TABLET BY MOUTH TWICE DAILY BEFORE MEALS  . insulin glargine (LANTUS) 100 UNIT/ML Solostar Pen Inject into the skin.  . Probiotic Product [facility]) CAPS Take by mouth.  . promethazine (PHENERGAN) 25 MG tablet Take 1 tablet (25 mg total) by mouth every 8 (eight) hours as needed for nausea or vomiting.     Allergies:   Hydromorphone and Nickel   Social History   Socioeconomic History  . Marital status: Married    Spouse name: Not on file  . Number of children: Not on file  . Years of education: Not on file  . Highest education level: Not on file  Occupational History  . Not on file  Tobacco Use  . Smoking status: Former Smoker    Types: Cigarettes    Quit date: 06/25/2015    Years since quitting: 4.0  . Smokeless tobacco: Never Used  Substance and Sexual Activity  . Alcohol use: Never  . Drug use: No  . Sexual activity: Not on file  Other Topics Concern  . Not on file  Social History Narrative  . Not on file   Social Determinants of Health   Financial Resource Strain:   . Difficulty of Paying Living Expenses:   Food Insecurity:   . Worried About Charity fundraiser in the Last Year:   . Arboriculturist in  the Last Year:   Transportation Needs:   . Film/video editor (Medical):   Marland Kitchen Lack of Transportation (Non-Medical):   Physical Activity:   . Days of Exercise per Week:   . Minutes of Exercise per Session:   Stress:   . Feeling of Stress :   Social Connections:   . Frequency of Communication with Friends and Family:   . Frequency of Social Gatherings with Friends and Family:   . Attends Religious Services:   . Active Member of Clubs or Organizations:   . Attends Archivist Meetings:   Marland Kitchen Marital Status:      Family History: The patient's family history includes Alzheimer's disease in his father; Diabetes in his maternal grandfather, maternal grandmother, paternal grandfather, and  paternal grandmother; Diabetes Mellitus II in his mother and sister; Hypertension in his maternal grandfather, maternal grandmother, mother, paternal grandfather, and paternal grandmother.  ROS:   Review of Systems  Constitution: Reports lightheadedness and dizziness.  Negative for decreased appetite, fever and weight gain.  HENT: Negative for congestion, ear discharge, hoarse voice and sore throat.   Eyes: Negative for discharge, redness, vision loss in right eye and visual halos.  Cardiovascular: Negative for chest pain, dyspnea on exertion, leg swelling, orthopnea and palpitations.  Respiratory: Negative for cough, hemoptysis, shortness of breath and snoring.   Endocrine: Negative for heat intolerance and polyphagia.  Hematologic/Lymphatic: Negative for bleeding problem. Does not bruise/bleed easily.  Skin: Negative for flushing, nail changes, rash and suspicious lesions.  Musculoskeletal: Negative for arthritis, joint pain, muscle cramps, myalgias, neck pain and stiffness.  Gastrointestinal: Negative for abdominal pain, bowel incontinence, diarrhea and excessive appetite.  Genitourinary: Negative for decreased libido, genital sores and incomplete emptying.  Neurological: Negative for brief paralysis, focal weakness, headaches and loss of balance.  Psychiatric/Behavioral: Negative for altered mental status, depression and suicidal ideas.  Allergic/Immunologic: Negative for HIV exposure and persistent infections.    EKGs/Labs/Other Studies Reviewed:    The following studies were reviewed today:   EKG: None today: EKG from June 18, 2019 reports sinus rhythm, heart rate 81 bpm, QTC 404  Transthoracic echocardiogram June 08, 2019: The left ventricular size is normal. The left ventricular wall thickness is normal.  There is normal global left ventricular contractility.  Overall ejection fraction low normal 50 to 55%.  The right ventricle is normal in size and function.  The left atrium is  normal in size.  There is mild aortic valve sclerosis.  Trace amount of air regurgitation.  Trace mitral vegetation.  Tricuspid regurgitation is trace.  Aortic root, ascending aorta and aortic arch were normal.  Bilateral carotid Doppler normal study.  June 08, 2019  Venous duplex ultrasound bilateral no deep venous thrombosis.  June 08, 2019  CT head June 07, 2019 no acute intracranial abnormalities.  Small foci of white matter diseases.  Findings are nonspecific but could represent chronic small vessel ischemic changes.  MRI of the head June 09, 2019 mild white matter disease nonspecific pattern.  History of hypertension and chronic left thalamic lacunar fevers premature small vessel ischemia.  Nuclear medicine ventilation/perfusion scan: Normal exam June 09, 2019  Recent Labs: 05/30/2019: BUN 14; Creatinine, Ser 1.83; Hemoglobin 9.6; Platelets 544; Potassium 4.3; Sodium 133  Recent Lipid Panel No results found for: CHOL, TRIG, HDL, CHOLHDL, VLDL, LDLCALC, LDLDIRECT  Physical Exam:    VS:  BP (!) 157/93 (BP Location: Right Arm, Patient Position: Sitting, Cuff Size: Normal)   Ht 6' (1.829 m)  Wt 182 lb (82.6 kg)   SpO2 99%   BMI 24.68 kg/m     Wt Readings from Last 3 Encounters:  06/25/19 182 lb (82.6 kg)  09/19/16 194 lb 0.1 oz (88 kg)  08/17/16 194 lb (88 kg)     GEN: Well nourished, well developed in no acute distress HEENT: Normal NECK: No JVD; No carotid bruits LYMPHATICS: No lymphadenopathy CARDIAC: S1S2 noted,RRR, no murmurs, rubs, gallops RESPIRATORY:  Clear to auscultation without rales, wheezing or rhonchi  ABDOMEN: Soft, non-tender, non-distended, +bowel sounds, no guarding. EXTREMITIES: Right metatarsal amputation, with diabetic foot ulcer and wound VAC in place.  No edema, No cyanosis, no clubbing MUSCULOSKELETAL:  No deformity  SKIN: Warm and dry, tattoos NEUROLOGIC:  Alert and oriented x 3, non-focal PSYCHIATRIC:  Normal affect, good insight  ASSESSMENT:      1. Orthostatic hypotension   2. Type 2 diabetes mellitus with hyperosmolar coma, with long-term current use of insulin ([place])   3. Tobacco use   4. Essential hypertension   5. Mixed diabetic hyperlipidemia associated with type 2 diabetes mellitus (HCC)   6. Stage 3b chronic kidney disease   7. Anemia of chronic disease    PLAN:    Orthostatic hypotension likely in the setting of diabetic dysautonomia -manual blood pressure taken in the office today: Standing 168/90 mmHg, heart rate 97 bpm, sitting 157/90 mmHg, 1 to 1 bpm, standing 129/88 mmHg, heart rate 106 bpm.  He was symptomatic at the time of his standing blood pressure.  I am going to give the patient a trial of midodrine 2.5 mg will start every 12 hours with the goal of adjusting every 8 hours once patient can tolerate this medication.  Essential hypertension-given his high dysautonomia causing significant orthostatic hypotension I will not adjust any blood pressure medication he will remain on amlodipine 5 mg daily.  Hyperlipidemia-continue patient on his current Lipitor 40 mg daily.  Uncontrolled diabetes-he has multiple complications from his diabetes is currently being managed by his primary care physician only.  I think is worth having the patient see a endocrinologist.  Tobacco use-the patient was counseled on tobacco cessation today for 5 minutes.  Counseling included reviewing the risks of smoking tobacco products, how it impacts the patient's current medical diagnoses and different strategies for quitting.  Pharmacotherapy to aid in tobacco cessation was not prescribed today. The patient coordinate with  primary care provider.  The patient was also advised to call   1-800-QUIT-NOW 912-132-3934) for additional help with quitting smoking.  Blood work will be done which will include BMP,magnesium and hemoglobin A1c.  The patient is in agreement with the above plan. The patient left the office in stable condition.  The  patient will follow up in 2 weeks or sooner if needed.    Medication Adjustments/Labs and Tests Ordered: Current medicines are reviewed at length with the patient today.  Concerns regarding medicines are outlined above.  Orders Placed This Encounter  Procedures  . Basic Metabolic Panel (BMET)  . Magnesium  . HgB A1c  . Ambulatory referral to Endocrinology   Meds ordered this encounter  Medications  . midodrine (PROAMATINE) 2.5 MG tablet    Sig: Take 1 tablet (2.5 mg total) by mouth 2 (two) times daily with a meal.    Dispense:  60 tablet    Refill:  2    Patient Instructions  Medication Instructions:  Your physician has recommended you make the following change in your medication:  START Midodrine 2.5  mg take one tablet by mouth twice daily. *If you need a refill on your cardiac medications before your next appointment, please call your pharmacy*   Lab Work: Your physician recommends that you return for lab work in: [place], A1C If you have labs (blood work) drawn today and your tests are completely normal, you will receive your results only by: Marland Kitchen MyChart Message (if you have MyChart) OR . A paper copy in the mail If you have any lab test that is abnormal or we need to change your treatment, we will call you to review the results.   Testing/Procedures: None   Follow-Up: At [facility], you and your health needs are our priority.  As part of our continuing mission to provide you with exceptional heart care, we have created designated Provider Care Teams.  These Care Teams include your primary Cardiologist (physician) and Advanced Practice Providers (APPs -  Physician Assistants and Nurse Practitioners) who all work together to provide you with the care you need, when you need it.  We recommend signing up for the patient portal called "MyChart".  Sign up information is provided on this After Visit Summary.  MyChart is used to connect with patients for Virtual  Visits (Telemedicine).  Patients are able to view lab/test results, encounter notes, upcoming appointments, etc.  Non-urgent messages can be sent to your provider as well.   To learn more about what you can do with MyChart, go to [email or web address].    Your next appointment:   2 week(s)  The format for your next appointment:   In Person  Provider:   Shirlee More, MD   Other Instructions We have put in a referral for you to see an Endocrinologist. You should receive a phone call from them to schedule this appointment.      Adopting a Healthy Lifestyle.  Know what a healthy weight is for you (roughly BMI <25) and aim to maintain this   Aim for 7+ servings of fruits and vegetables daily   65-80+ fluid ounces of water or unsweet tea for healthy kidneys   Limit to max 1 drink of alcohol per day; avoid smoking/tobacco   Limit animal fats in diet for cholesterol and heart health - choose grass fed whenever available   Avoid highly processed foods, and foods high in saturated/trans fats   Aim for low stress - take time to unwind and care for your mental health   Aim for 150 min of moderate intensity exercise weekly for heart health, and weights twice weekly for bone health   Aim for 7-9 hours of sleep daily   When it comes to diets, agreement about the perfect plan isnt easy to find, even among the experts. Experts at the [employer] developed an idea known as the Healthy Eating Plate. Just imagine a plate divided into logical, healthy portions.   The emphasis is on diet quality:   Load up on vegetables and fruits - one-half of your plate: Aim for color and variety, and remember that potatoes dont count.   Go for whole grains - one-quarter of your plate: Whole wheat, barley, wheat berries, quinoa, oats, brown rice, and foods made with them. If you want pasta, go with whole wheat pasta.   Protein power - one-quarter of your plate: Fish, chicken, beans,  and nuts are all healthy, versatile protein sources. Limit red meat.   The diet, however, does go beyond the plate, offering a few other suggestions.  Use healthy plant oils, such as olive, canola, soy, corn, sunflower and peanut. Check the labels, and avoid partially hydrogenated oil, which have unhealthy trans fats.   If youre thirsty, drink water. Coffee and tea are good in moderation, but skip sugary drinks and limit milk and dairy products to one or two daily servings.   The type of carbohydrate in the diet is more important than the amount. Some sources of carbohydrates, such as vegetables, fruits, whole grains, and beans-are healthier than others.   Finally, stay active  Signed, Berniece Salines, DO  06/25/2019 9:09 AM    [place]

## 2019-06-25 ENCOUNTER — Ambulatory Visit: Payer: Medicaid Other | Admitting: Cardiology

## 2019-06-25 ENCOUNTER — Encounter: Payer: Self-pay | Admitting: Cardiology

## 2019-06-25 ENCOUNTER — Other Ambulatory Visit: Payer: Self-pay

## 2019-06-25 VITALS — BP 157/93 | Ht 72.0 in | Wt 182.0 lb

## 2019-06-25 DIAGNOSIS — I10 Essential (primary) hypertension: Secondary | ICD-10-CM

## 2019-06-25 DIAGNOSIS — E782 Mixed hyperlipidemia: Secondary | ICD-10-CM

## 2019-06-25 DIAGNOSIS — E1101 Type 2 diabetes mellitus with hyperosmolarity with coma: Secondary | ICD-10-CM

## 2019-06-25 DIAGNOSIS — Z794 Long term (current) use of insulin: Secondary | ICD-10-CM

## 2019-06-25 DIAGNOSIS — I951 Orthostatic hypotension: Secondary | ICD-10-CM | POA: Diagnosis not present

## 2019-06-25 DIAGNOSIS — N1832 Chronic kidney disease, stage 3b: Secondary | ICD-10-CM

## 2019-06-25 DIAGNOSIS — E1169 Type 2 diabetes mellitus with other specified complication: Secondary | ICD-10-CM

## 2019-06-25 DIAGNOSIS — Z720 Tobacco use: Secondary | ICD-10-CM | POA: Diagnosis not present

## 2019-06-25 DIAGNOSIS — D638 Anemia in other chronic diseases classified elsewhere: Secondary | ICD-10-CM

## 2019-06-25 MED ORDER — MIDODRINE HCL 2.5 MG PO TABS: 2.5000 mg | ORAL_TABLET | Freq: Two times a day (BID) | ORAL | 2 refills | Status: DC

## 2019-06-25 NOTE — Patient Instructions (Addendum)
Medication Instructions:  Your physician has recommended you make the following change in your medication:  START Midodrine 2.5 mg take one tablet by mouth twice daily. *If you need a refill on your cardiac medications before your next appointment, please call your pharmacy*   Lab Work: Your physician recommends that you return for lab work in: [place], A1C If you have labs (blood work) drawn today and your tests are completely normal, you will receive your results only by: Marland Kitchen MyChart Message (if you have MyChart) OR . A paper copy in the mail If you have any lab test that is abnormal or we need to change your treatment, we will call you to review the results.   Testing/Procedures: None   Follow-Up: At [facility], you and your health needs are our priority.  As part of our continuing mission to provide you with exceptional heart care, we have created designated Provider Care Teams.  These Care Teams include your primary Cardiologist (physician) and Advanced Practice Providers (APPs -  Physician Assistants and Nurse Practitioners) who all work together to provide you with the care you need, when you need it.  We recommend signing up for the patient portal called "MyChart".  Sign up information is provided on this After Visit Summary.  MyChart is used to connect with patients for Virtual Visits (Telemedicine).  Patients are able to view lab/test results, encounter notes, upcoming appointments, etc.  Non-urgent messages can be sent to your provider as well.   To learn more about what you can do with MyChart, go to [email or web address].    Your next appointment:   2 week(s)  The format for your next appointment:   In Person  Provider:   Dr. Harriet Masson   Other Instructions We have put in a referral for you to see an Endocrinologist. You should receive a phone call from them to schedule this appointment.

## 2019-06-26 ENCOUNTER — Telehealth: Payer: Self-pay

## 2019-06-26 LAB — BASIC METABOLIC PANEL WITH GFR
BUN/Creatinine Ratio: 13 (ref 9–20)
BUN: 21 mg/dL (ref 6–24)
CO2: 22 mmol/L (ref 20–29)
Calcium: 9.4 mg/dL (ref 8.7–10.2)
Chloride: 100 mmol/L (ref 96–106)
Creatinine, Ser: 1.62 mg/dL — ABNORMAL HIGH (ref 0.76–1.27)
GFR calc Af Amer: 58 mL/min/1.73 — ABNORMAL LOW
GFR calc non Af Amer: 51 mL/min/1.73 — ABNORMAL LOW
Glucose: 235 mg/dL — ABNORMAL HIGH (ref 65–99)
Potassium: 4.9 mmol/L (ref 3.5–5.2)
Sodium: 133 mmol/L — ABNORMAL LOW (ref 134–144)

## 2019-06-26 LAB — HEMOGLOBIN A1C
Est. average glucose Bld gHb Est-mCnc: 169 mg/dL
Hgb A1c MFr Bld: 7.5 % — ABNORMAL HIGH (ref 4.8–5.6)

## 2019-06-26 LAB — MAGNESIUM: Magnesium: 1.7 mg/dL (ref 1.6–2.3)

## 2019-06-26 NOTE — Telephone Encounter (Signed)
-----   Message from Berniece Salines, DO sent at 06/26/2019  9:59 AM EDT ----- His creatinine is improving as the hospital.  Please make sure that he is taking in fluids.  Also his hemoglobin A1c is 7.5.  Please check with the patient to see if he has been able to schedule the endocrinology is appointment if not weekly please help him to set up with one of our endocrinologists.

## 2019-06-26 NOTE — Telephone Encounter (Signed)
No answer, no VM.Marland Kitchen  3/25

## 2019-06-27 ENCOUNTER — Telehealth: Payer: Self-pay

## 2019-06-27 NOTE — Telephone Encounter (Signed)
The patient has been notified of the lab  result and verbalized understanding.  All questions (if any) were answered. Frederik Schmidt, RN 06/27/2019 12:46 PM

## 2019-06-27 NOTE — Telephone Encounter (Signed)
-----   Message from Berniece Salines, DO sent at 06/26/2019  9:59 AM EDT ----- His creatinine is improving as the hospital.  Please make sure that he is taking in fluids.  Also his hemoglobin A1c is 7.5.  Please check with the patient to see if he has been able to schedule the endocrinology is appointment if not weekly please help him to set up with one of our endocrinologists.

## 2019-06-30 ENCOUNTER — Encounter: Payer: Medicaid Other | Admitting: Podiatry

## 2019-07-01 ENCOUNTER — Other Ambulatory Visit: Payer: Self-pay

## 2019-07-01 ENCOUNTER — Ambulatory Visit (INDEPENDENT_AMBULATORY_CARE_PROVIDER_SITE_OTHER): Payer: Medicaid Other | Admitting: Podiatry

## 2019-07-01 DIAGNOSIS — M86671 Other chronic osteomyelitis, right ankle and foot: Secondary | ICD-10-CM

## 2019-07-01 DIAGNOSIS — L97514 Non-pressure chronic ulcer of other part of right foot with necrosis of bone: Secondary | ICD-10-CM

## 2019-07-01 DIAGNOSIS — E13621 Other specified diabetes mellitus with foot ulcer: Secondary | ICD-10-CM

## 2019-07-01 NOTE — Progress Notes (Signed)
  Subjective:  Patient ID: Dwain Chuck Hint, male    DOB: Aug 13, 1975,  MRN: [id number]  Chief Complaint  Patient presents with  . Wound Check    F/u Rt foto wound vac check pt. states," I  feel my foot good, nurse says it's looking good." -less drianage -pt dneis redness/swellgin/N/V/F?Ch Tx: abx and crutches     DOS: 06/03/19 Procedure: Wound Debridement Right including Debridement of Calcaneus bone, exchange of antibiotic spacer, wound VAC application  44 y.o. male presents with the above complaint. History confirmed with patient.   Objective:  Physical Exam: tenderness at the surgical site, local edema noted and calf supple, nontender. Incision: healing with maceration, no exposed bone or antibiotic spacer. Reducible varus deformity. Assessment:   1. Chronic osteomyelitis involving right ankle and foot ([place])   2. 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  Post-operative State -Wound continues to show maceration but improving. Wound edges coming in. Continue VAC. No supplies to reapply today. Will be reapplied tomorrow. Betadine WTD applied today -Long term plan once wound heals is AFO brace and shoe. -Continue ABx to completion  Return in about 1 week (around 07/08/2019).

## 2019-07-07 ENCOUNTER — Encounter: Payer: Self-pay | Admitting: Podiatry

## 2019-07-07 ENCOUNTER — Ambulatory Visit (INDEPENDENT_AMBULATORY_CARE_PROVIDER_SITE_OTHER): Payer: Medicaid Other | Admitting: Podiatry

## 2019-07-07 ENCOUNTER — Other Ambulatory Visit: Payer: Self-pay

## 2019-07-07 DIAGNOSIS — E13621 Other specified diabetes mellitus with foot ulcer: Secondary | ICD-10-CM

## 2019-07-07 DIAGNOSIS — L97514 Non-pressure chronic ulcer of other part of right foot with necrosis of bone: Secondary | ICD-10-CM

## 2019-07-07 DIAGNOSIS — M86671 Other chronic osteomyelitis, right ankle and foot: Secondary | ICD-10-CM

## 2019-07-07 NOTE — Progress Notes (Signed)
  Subjective:  Patient ID: Jorge Higgins, male    DOB: 1976/04/01,  MRN: [id number]  Chief Complaint  Patient presents with  . Wound Check    F?U Lt wound check pt. states," I think it's improving but not moving gas as with thte wound vac." -pt denies draiange/redness/swelling Tx: silvadene and bandaid and boot     DOS: 06/03/19 Procedure: Wound Debridement Right including Debridement of Calcaneus bone, exchange of antibiotic spacer, wound VAC application  44 y.o. male presents with the above complaint. History confirmed with patient.   Objective:  Physical Exam: tenderness at the surgical site, local edema noted and calf supple, nontender. Incision: healing with maceration, no exposed bone. Exposed abx spacer. 7.5x1.5 Assessment:   1. Chronic osteomyelitis involving right ankle and foot ([place])   2. 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  Post-operative State -Wound slowly improving. Discussed he may need wound debridement and abx spacer exchange as it was palpable today. Will plan more next visit. -Expect him to be out of work for at least the next 3 months as the wound heals. He will not be cleared for work until fully healed and the foot is in a brace. No follow-ups on file.

## 2019-07-09 ENCOUNTER — Ambulatory Visit: Payer: Medicaid Other | Admitting: Cardiology

## 2019-07-14 ENCOUNTER — Encounter: Payer: Medicaid Other | Admitting: Podiatry

## 2019-07-15 ENCOUNTER — Other Ambulatory Visit: Payer: Self-pay

## 2019-07-15 ENCOUNTER — Ambulatory Visit (INDEPENDENT_AMBULATORY_CARE_PROVIDER_SITE_OTHER): Payer: Medicaid Other | Admitting: Podiatry

## 2019-07-15 ENCOUNTER — Telehealth: Payer: Self-pay

## 2019-07-15 DIAGNOSIS — M86671 Other chronic osteomyelitis, right ankle and foot: Secondary | ICD-10-CM

## 2019-07-15 DIAGNOSIS — L97514 Non-pressure chronic ulcer of other part of right foot with necrosis of bone: Secondary | ICD-10-CM

## 2019-07-15 DIAGNOSIS — E13621 Other specified diabetes mellitus with foot ulcer: Secondary | ICD-10-CM

## 2019-07-15 NOTE — Telephone Encounter (Signed)
Jorge Higgins from [facility] called stating or requesting to know if it's okay to do wound vac first thing in the morning tomorrow VS today, and if you want/need them to go and do wound vac change today do you also want them to go Thursday since pt is having sx Friday.

## 2019-07-15 NOTE — Telephone Encounter (Signed)
Called back Jorge Higgins form [facility] and advised her that per Dr. March Rummage they can do wound vac change early tommorow and until next Monday

## 2019-07-15 NOTE — Telephone Encounter (Signed)
They can go first thing in the morning tomorrow and then not change until next Monday assuming he has surgery Friday

## 2019-07-15 NOTE — Patient Instructions (Signed)
Pre-Operative Instructions  Congratulations, you have decided to take an important step towards improving your quality of life.  You can be assured that the doctors and staff at [facility] will be with you every step of the way.  Here are some important things you should know:  1. Plan to be at the surgery center/hospital at least 1 (one) hour prior to your scheduled time, unless otherwise directed by the surgical center/hospital staff.  You must have a responsible adult accompany you, remain during the surgery and drive you home.  Make sure you have directions to the surgical center/hospital to ensure you arrive on time. 2. If you are having surgery at [facility] or [facility] hospitals, you will need a copy of your medical history and physical form from your family physician within one month prior to the date of surgery. We will give you a form for your primary physician to complete.  3. We make every effort to accommodate the date you request for surgery.  However, there are times where surgery dates or times have to be moved.  We will contact you as soon as possible if a change in schedule is required.   4. No aspirin/ibuprofen for one week before surgery.  If you are on aspirin, any non-steroidal anti-inflammatory medications (Mobic, Aleve, Ibuprofen) should not be taken seven (7) days prior to your surgery.  You make take Tylenol for pain prior to surgery.  5. Medications - If you are taking daily heart and blood pressure medications, seizure, reflux, allergy, asthma, anxiety, pain or diabetes medications, make sure you notify the surgery center/hospital before the day of surgery so they can tell you which medications you should take or avoid the day of surgery. 6. No food or drink after midnight the night before surgery unless directed otherwise by surgical center/hospital staff. 7. No alcoholic beverages 24-hours prior to surgery.  No smoking 24-hours prior or 24-hours after  surgery. 8. Wear loose pants or shorts. They should be loose enough to fit over bandages, boots, and casts. 9. Don't wear slip-on shoes. Sneakers are preferred. 10. Bring your boot with you to the surgery center/hospital.  Also bring crutches or a walker if your physician has prescribed it for you.  If you do not have this equipment, it will be provided for you after surgery. 11. If you have not been contacted by the surgery center/hospital by the day before your surgery, call to confirm the date and time of your surgery. 12. Leave-time from work may vary depending on the type of surgery you have.  Appropriate arrangements should be made prior to surgery with your employer. 13. Prescriptions will be provided immediately following surgery by your doctor.  Fill these as soon as possible after surgery and take the medication as directed. Pain medications will not be refilled on weekends and must be approved by the doctor. 14. Remove nail polish on the operative foot and avoid getting pedicures prior to surgery. 15. Wash the night before surgery.  The night before surgery wash the foot and leg well with water and the antibacterial soap provided. Be sure to pay special attention to beneath the toenails and in between the toes.  Wash for at least three (3) minutes. Rinse thoroughly with water and dry well with a towel.  Perform this wash unless told not to do so by your physician.  Enclosed: 1 Ice pack (please put in freezer the night before surgery)   1 Hibiclens skin cleaner     Pre-op instructions  If you have any questions regarding the instructions, please do not hesitate to call our office.  [place]: [address], [place], [place] [address] -- [phone]  [place]: [address] [place], [place] [address] -- [phone]  [place]: [address], , [place] [address] -- [phone]   Website: [email or web address] 

## 2019-07-16 ENCOUNTER — Ambulatory Visit (INDEPENDENT_AMBULATORY_CARE_PROVIDER_SITE_OTHER): Payer: Medicaid Other | Admitting: Cardiology

## 2019-07-16 ENCOUNTER — Encounter: Payer: Self-pay | Admitting: Cardiology

## 2019-07-16 VITALS — HR 90 | Ht 72.0 in | Wt 187.2 lb

## 2019-07-16 DIAGNOSIS — I10 Essential (primary) hypertension: Secondary | ICD-10-CM | POA: Diagnosis not present

## 2019-07-16 DIAGNOSIS — I951 Orthostatic hypotension: Secondary | ICD-10-CM

## 2019-07-16 DIAGNOSIS — Z720 Tobacco use: Secondary | ICD-10-CM

## 2019-07-16 DIAGNOSIS — G901 Familial dysautonomia [Riley-Day]: Secondary | ICD-10-CM | POA: Insufficient documentation

## 2019-07-16 DIAGNOSIS — E1169 Type 2 diabetes mellitus with other specified complication: Secondary | ICD-10-CM | POA: Diagnosis not present

## 2019-07-16 DIAGNOSIS — E782 Mixed hyperlipidemia: Secondary | ICD-10-CM

## 2019-07-16 NOTE — Patient Instructions (Signed)
Medication Instructions:  Your physician recommends that you continue on your current medications as directed. Please refer to the Current Medication list given to you today.  *If you need a refill on your cardiac medications before your next appointment, please call your pharmacy*   Follow-Up: At [facility], you and your health needs are our priority.  As part of our continuing mission to provide you with exceptional heart care, we have created designated Provider Care Teams.  These Care Teams include your primary Cardiologist (physician) and Advanced Practice Providers (APPs -  Physician Assistants and Nurse Practitioners) who all work together to provide you with the care you need, when you need it.  We recommend signing up for the patient portal called "MyChart".  Sign up information is provided on this After Visit Summary.  MyChart is used to connect with patients for Virtual Visits (Telemedicine).  Patients are able to view lab/test results, encounter notes, upcoming appointments, etc.  Non-urgent messages can be sent to your provider as well.   To learn more about what you can do with MyChart, go to [email or web address].    Your next appointment:   6 month(s)  The format for your next appointment:   In Person  Provider:   You will see Berniece Salines, DO.  Or, you can be scheduled with the following Advanced Practice Provider on your designated Care Team (at our [facility]):  Laurann Montana, FNP

## 2019-07-16 NOTE — Progress Notes (Signed)
Cardiology Office Note:    Date:  07/16/2019   ID:  Jorge Higgins, DOB Aug 02, 1975, MRN [id number]  PCP:  Coy Saunas, MD  Cardiologist:  Berniece Salines, DO  Electrophysiologist:  None   Referring MD: Coy Saunas, MD   Patient here for follow-up visit.  History of Present Illness:    Jorge Higgins is a 44 y.o. male with a hx of diabetes type 2, hypertension and recently with significant symptomatic orthostatic hypotension in the setting of dysautonomia, hyperlipidemia, diabetic foot ulcer with chronic osteomyelitis of the right foot currently with wound VAC, currently on long-term antibiotics due to his infection, CKD stage III, anemia of chronic disease.   I did see the patient on June 25, 2019 at that time I started him on midodrine to help with his orthostatic hypotension.  Also referred the patient to endocrinologist due to his uncontrolled diabetes.  He is here today for follow-up visit.  He notes that he has been better.  He took his midodrine for about 2 weeks and then stop this medication as he felt his blood pressure was going higher than usual.  But his orthostatic symptoms have improved.  He still does feel improvement in the symptoms even though he no longer taking the midodrine for the last week and a half.  Unable to see the endocrinologist dated first available appointment is september 2021.  Past Medical History:  Diagnosis Date  . Anemia of chronic disease   . Chronic osteomyelitis ([place])   . CKD (chronic kidney disease)   . Diabetes mellitus without complication ([place])   . Diabetic foot ulcer ([place])   . Hypertension   . Tobacco abuse     Past Surgical History:  Procedure Laterality Date  . AMPUTATION Left 09/19/2016   Procedure: PARTIAL 5th RAY AMPUTATION;  Surgeon: [name], DPM;  Location: [facility];  Service: Podiatry;  Laterality: Left;  . FOOT SURGERY Right 04/2019   Took bone out and put in spacer/ 3 surgeries  . TOE AMPUTATION       Current Medications: Current Meds  Medication Sig  . amLODipine (NORVASC) 5 MG tablet Take 1 tablet (5 mg total) by mouth daily.  Marland Kitchen glipiZIDE (GLUCOTROL) 5 MG tablet TAKE 1 TABLET BY MOUTH TWICE DAILY BEFORE MEALS  . insulin glargine (LANTUS) 100 UNIT/ML Solostar Pen Inject into the skin.  . Probiotic Product [facility]) CAPS Take by mouth.  . promethazine (PHENERGAN) 25 MG tablet Take 1 tablet (25 mg total) by mouth every 8 (eight) hours as needed for nausea or vomiting.     Allergies:   Levaquin [levofloxacin], Hydromorphone, and Nickel   Social History   Socioeconomic History  . Marital status: Married    Spouse name: Not on file  . Number of children: Not on file  . Years of education: Not on file  . Highest education level: Not on file  Occupational History  . Not on file  Tobacco Use  . Smoking status: Former Smoker    Types: Cigarettes    Quit date: 06/25/2015    Years since quitting: 4.0  . Smokeless tobacco: Never Used  Substance and Sexual Activity  . Alcohol use: Never  . Drug use: No  . Sexual activity: Not on file  Other Topics Concern  . Not on file  Social History Narrative  . Not on file   Social Determinants of Health   Financial Resource Strain:   . Difficulty of Paying Living Expenses:  Food Insecurity:   . Worried About Charity fundraiser in the Last Year:   . Arboriculturist in the Last Year:   Transportation Needs:   . Film/video editor (Medical):   Marland Kitchen Lack of Transportation (Non-Medical):   Physical Activity:   . Days of Exercise per Week:   . Minutes of Exercise per Session:   Stress:   . Feeling of Stress :   Social Connections:   . Frequency of Communication with Friends and Family:   . Frequency of Social Gatherings with Friends and Family:   . Attends Religious Services:   . Active Member of Clubs or Organizations:   . Attends Archivist Meetings:   Marland Kitchen Marital Status:      Family History: The patient's  family history includes Alzheimer's disease in his father; Diabetes in his maternal grandfather, maternal grandmother, paternal grandfather, and paternal grandmother; Diabetes Mellitus II in his mother and sister; Hypertension in his maternal grandfather, maternal grandmother, mother, paternal grandfather, and paternal grandmother.  ROS:   Review of Systems  Constitution: Negative for decreased appetite, fever and weight gain.  HENT: Negative for congestion, ear discharge, hoarse voice and sore throat.   Eyes: Negative for discharge, redness, vision loss in right eye and visual halos.  Cardiovascular: Negative for chest pain, dyspnea on exertion, leg swelling, orthopnea and palpitations.  Respiratory: Negative for cough, hemoptysis, shortness of breath and snoring.   Endocrine: Negative for heat intolerance and polyphagia.  Hematologic/Lymphatic: Negative for bleeding problem. Does not bruise/bleed easily.  Skin: Negative for flushing, nail changes, rash and suspicious lesions.  Musculoskeletal: Negative for arthritis, joint pain, muscle cramps, myalgias, neck pain and stiffness.  Gastrointestinal: Negative for abdominal pain, bowel incontinence, diarrhea and excessive appetite.  Genitourinary: Negative for decreased libido, genital sores and incomplete emptying.  Neurological: Negative for brief paralysis, focal weakness, headaches and loss of balance.  Psychiatric/Behavioral: Negative for altered mental status, depression and suicidal ideas.  Allergic/Immunologic: Negative for HIV exposure and persistent infections.    EKGs/Labs/Other Studies Reviewed:    The following studies were reviewed today:   EKG: None today   Transthoracic echocardiogram June 08, 2019: The left ventricular size is normal. The left ventricular wall thickness is normal.  There is normal global left ventricular contractility.  Overall ejection fraction low normal 50 to 55%.  The right ventricle is normal in size  and function.  The left atrium is normal in size.  There is mild aortic valve sclerosis.  Trace amount of air regurgitation.  Trace mitral vegetation.  Tricuspid regurgitation is trace.  Aortic root, ascending aorta and aortic arch were normal.  Bilateral carotid Doppler normal study.  June 08, 2019  Venous duplex ultrasound bilateral no deep venous thrombosis.  June 08, 2019  CT head June 07, 2019 no acute intracranial abnormalities.  Small foci of white matter diseases.  Findings are nonspecific but could represent chronic small vessel ischemic changes.  MRI of the head June 09, 2019 mild white matter disease nonspecific pattern.  History of hypertension and chronic left thalamic lacunar fevers premature small vessel ischemia.  Nuclear medicine ventilation/perfusion scan: Normal exam June 09, 2019  Recent Labs: 05/30/2019: Hemoglobin 9.6; Platelets 544 06/25/2019: BUN 21; Creatinine, Ser 1.62; Magnesium 1.7; Potassium 4.9; Sodium 133  Recent Lipid Panel No results found for: CHOL, TRIG, HDL, CHOLHDL, VLDL, LDLCALC, LDLDIRECT  Physical Exam:    VS:  Pulse 90   Ht 6' (1.829 m)   Wt 187  lb 3.2 oz (84.9 kg)   SpO2 100%   BMI 25.39 kg/m     Wt Readings from Last 3 Encounters:  07/16/19 187 lb 3.2 oz (84.9 kg)  06/25/19 182 lb (82.6 kg)  09/19/16 194 lb 0.1 oz (88 kg)     GEN: Well nourished, well developed in no acute distress HEENT: Normal NECK: No JVD; No carotid bruits LYMPHATICS: No lymphadenopathy CARDIAC: S1S2 noted,RRR, no murmurs, rubs, gallops RESPIRATORY:  Clear to auscultation without rales, wheezing or rhonchi  ABDOMEN: Soft, non-tender, non-distended, +bowel sounds, no guarding. EXTREMITIES: No edema, No cyanosis, no clubbing MUSCULOSKELETAL:  No deformity  SKIN: Warm and dry NEUROLOGIC:  Alert and oriented x 3, non-focal PSYCHIATRIC:  Normal affect, good insight  ASSESSMENT:    1. Orthostatic hypotension   2. Essential hypertension   3. Mixed diabetic  hyperlipidemia associated with type 2 diabetes mellitus ([place])   4. Tobacco abuse   5. Dysautonomia ([place])    PLAN:     Vitals manually coming the office today lying 164/95 mmHg, heart rate 90 bpm, sitting 143/93, heart rate 97 bpm, standing 135/87 101 bpm no symptoms during this time.  For now I will keep the patient off the midodrine.  He will keep it and take it as needed.  Hyperlipidemia-he is stop the Lipitor, he thinks that this was making him significantly weak.  Diabetes mellitus-management per PCP.  Pending endocrine evaluation which is scheduled for September 2021 due to availability.  Chronic osteomyelitis of the foot-he is pending procedure with his podiatry. From a cardiovascular standpoint patient is acceptable risk for this procedure.  Tobacco use-the patient was counseled on tobacco cessation today for 5 minutes.  Counseling included reviewing the risks of smoking tobacco products, how it impacts the patient's current medical diagnoses and different strategies for quitting.  Pharmacotherapy to aid in tobacco cessation was not prescribed today. The patient coordinate with  primary care provider.  The patient was also advised to call  1-800-QUIT-NOW (279)656-0186) for additional help with quitting smoking.  The patient is in agreement with the above plan. The patient left the office in stable condition.  The patient will follow up in 6 months sooner as needed.   Medication Adjustments/Labs and Tests Ordered: Current medicines are reviewed at length with the patient today.  Concerns regarding medicines are outlined above.  No orders of the defined types were placed in this encounter.  No orders of the defined types were placed in this encounter.   Patient Instructions  Medication Instructions:  Your physician recommends that you continue on your current medications as directed. Please refer to the Current Medication list given to you today.  *If you need a refill on your  cardiac medications before your next appointment, please call your pharmacy*   Follow-Up: At [facility], you and your health needs are our priority.  As part of our continuing mission to provide you with exceptional heart care, we have created designated Provider Care Teams.  These Care Teams include your primary Cardiologist (physician) and Advanced Practice Providers (APPs -  Physician Assistants and Nurse Practitioners) who all work together to provide you with the care you need, when you need it.  We recommend signing up for the patient portal called "MyChart".  Sign up information is provided on this After Visit Summary.  MyChart is used to connect with patients for Virtual Visits (Telemedicine).  Patients are able to view lab/test results, encounter notes, upcoming appointments, etc.  Non-urgent messages can be  sent to your provider as well.   To learn more about what you can do with MyChart, go to [email or web address].    Your next appointment:   6 month(s)  The format for your next appointment:   In Person  Provider:   You will see Berniece Salines, DO.  Or, you can be scheduled with the following Advanced Practice Provider on your designated Care Team (at our [facility]):  Laurann Montana, FNP       Adopting a Healthy Lifestyle.  Know what a healthy weight is for you (roughly BMI <25) and aim to maintain this   Aim for 7+ servings of fruits and vegetables daily   65-80+ fluid ounces of water or unsweet tea for healthy kidneys   Limit to max 1 drink of alcohol per day; avoid smoking/tobacco   Limit animal fats in diet for cholesterol and heart health - choose grass fed whenever available   Avoid highly processed foods, and foods high in saturated/trans fats   Aim for low stress - take time to unwind and care for your mental health   Aim for 150 min of moderate intensity exercise weekly for heart health, and weights twice weekly for bone health   Aim for 7-9  hours of sleep daily   When it comes to diets, agreement about the perfect plan isnt easy to find, even among the experts. Experts at the [employer] developed an idea known as the Healthy Eating Plate. Just imagine a plate divided into logical, healthy portions.   The emphasis is on diet quality:   Load up on vegetables and fruits - one-half of your plate: Aim for color and variety, and remember that potatoes dont count.   Go for whole grains - one-quarter of your plate: Whole wheat, barley, wheat berries, quinoa, oats, brown rice, and foods made with them. If you want pasta, go with whole wheat pasta.   Protein power - one-quarter of your plate: Fish, chicken, beans, and nuts are all healthy, versatile protein sources. Limit red meat.   The diet, however, does go beyond the plate, offering a few other suggestions.   Use healthy plant oils, such as olive, canola, soy, corn, sunflower and peanut. Check the labels, and avoid partially hydrogenated oil, which have unhealthy trans fats.   If youre thirsty, drink water. Coffee and tea are good in moderation, but skip sugary drinks and limit milk and dairy products to one or two daily servings.   The type of carbohydrate in the diet is more important than the amount. Some sources of carbohydrates, such as vegetables, fruits, whole grains, and beans-are healthier than others.   Finally, stay active  Signed, Berniece Salines, DO  07/16/2019 9:23 AM    [facility]

## 2019-07-17 ENCOUNTER — Ambulatory Visit: Payer: Self-pay | Admitting: Podiatry

## 2019-07-17 DIAGNOSIS — M86671 Other chronic osteomyelitis, right ankle and foot: Secondary | ICD-10-CM

## 2019-07-18 ENCOUNTER — Ambulatory Visit (HOSPITAL_BASED_OUTPATIENT_CLINIC_OR_DEPARTMENT_OTHER): Admission: RE | Admit: 2019-07-18 | Payer: Medicaid Other | Source: Home / Self Care | Admitting: Podiatry

## 2019-07-18 ENCOUNTER — Encounter (HOSPITAL_BASED_OUTPATIENT_CLINIC_OR_DEPARTMENT_OTHER): Admission: RE | Payer: Self-pay | Source: Home / Self Care

## 2019-07-18 SURGERY — DEBRIDEMENT, WOUND
Anesthesia: Monitor Anesthesia Care | Laterality: Right

## 2019-07-21 ENCOUNTER — Ambulatory Visit: Payer: Self-pay | Admitting: Podiatry

## 2019-07-21 ENCOUNTER — Other Ambulatory Visit: Payer: Self-pay

## 2019-07-21 ENCOUNTER — Ambulatory Visit (INDEPENDENT_AMBULATORY_CARE_PROVIDER_SITE_OTHER): Payer: Medicaid Other | Admitting: Podiatry

## 2019-07-21 DIAGNOSIS — E13621 Other specified diabetes mellitus with foot ulcer: Secondary | ICD-10-CM

## 2019-07-21 DIAGNOSIS — L97514 Non-pressure chronic ulcer of other part of right foot with necrosis of bone: Secondary | ICD-10-CM

## 2019-07-21 NOTE — Progress Notes (Signed)
  Subjective:  Patient ID: Jorge Higgins, male    DOB: 08-Jan-1976,  MRN: [id number]  No chief complaint on file.   44 y.o. male presents for wound care. Hx confirmed with patient. Denies issues/concerns. States nurse is coming today to change wound VAC Objective:  Physical Exam: Wound Location: right foot Wound Measurement: 7x1 Wound Base: Mixed Granular/Fibrotic Peri-wound: Macerated Exudate: Moderate amount Serosanguinous exudate wound without warmth, erythema, signs of acute infection  Assessment:   1. 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  Ulcer right foot -Ulcer cleansed -Pending debridement and irrigation with abx spacer exchange next week. -Will need brace for R foot at later date. -Dressed with betadine WTD today. -[place] nurse to apply wound VAC today -Continue Abx to completion.  No follow-ups on file.  MDM

## 2019-07-21 NOTE — H&P (View-Only) (Signed)
  Subjective:  Patient ID: Jorge Higgins, male    DOB: February 24, 1976,  MRN: [id number]  No chief complaint on file.   44 y.o. male presents for wound care. Hx confirmed with patient. Denies issues/concerns. States nurse is coming today to change wound VAC Objective:  Physical Exam: Wound Location: right foot Wound Measurement: 7x1 Wound Base: Mixed Granular/Fibrotic Peri-wound: Macerated Exudate: Moderate amount Serosanguinous exudate wound without warmth, erythema, signs of acute infection  Assessment:   1. 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  Ulcer right foot -Ulcer cleansed -Pending debridement and irrigation with abx spacer exchange next week. -Will need brace for R foot at later date. -Dressed with betadine WTD today. -Jorge Higgins nurse to apply wound VAC today -Continue Abx to completion.  No follow-ups on file.  MDM

## 2019-07-22 ENCOUNTER — Encounter: Payer: Medicaid Other | Admitting: Podiatry

## 2019-07-24 ENCOUNTER — Encounter (HOSPITAL_BASED_OUTPATIENT_CLINIC_OR_DEPARTMENT_OTHER): Payer: Self-pay | Admitting: Podiatry

## 2019-07-24 ENCOUNTER — Other Ambulatory Visit: Payer: Self-pay

## 2019-07-24 NOTE — Progress Notes (Addendum)
ADDENDUM:  Chart reviewed by anesthesia, Jesscia Zanetto PA, ok to proceed.   Spoke w/ via phone for pre-op interview--- PT Lab needs dos----  no             Lab results------ in care everywhere/ chart pt has current CBC/ CMP dated 07-17-2019/  Current EKG in epic/ chart  COVID test ------ 07-26-2019 @ 0920  Arrive at ------- 0800 NPO after ------  [place] Medications to take morning of surgery ----- Norvasc, Protonix w/ sips of water Diabetic medication ----- do note take glipizide morning of surgery/  Do half lantus dose night before surgery (2.5units) Patient Special Instructions ----- asked to bring wound vac supplies  Pre-Op special Istructions ----- pt's pcp, Dr Alean Rinne, H&P dated 07-17-2019 , received via fax from pcp office, is with chart.  And Dr Rosana Hoes last office w/ chart and care everywhere  Patient verbalized understanding of instructions that were given at this phone interview. Patient denies shortness of breath, chest pain, fever, cough a this phone interview.   Anesthesia Review:  Hx DM2, CKD3, HTN, Anemia d/r renal disease;  Recent hospital admission at [facility] (03/ 2021) for near syncope, per pt's PCP note 07-17-2019 dx hyperkalemia/ orthostatic hypotension (pt stated weakness, heart racing, dizziness, nausea, caused by started on  levaquin.  Per pt his bp is ok now on norvasc and all other symptoms resolved). Chart to be reviewed by Konrad Felix PA.  PCP:  Dr Alean Rinne (lov 07-17-2019 care everywhere and labs done same day) Cardiologist : no Chest x-ray : no EKG : 06-18-2019 epic Echo : 06-07-2019 epic Stress test:  Pt stated never had one Cardiac Cath :  no Sleep Study/ CPAP :  NO Fasting Blood Sugar :   110   / Checks Blood Sugar -- times a day:   TID Blood Thinner/ Instructions /Last Dose: NO ASA / Instructions/ Last Dose :  NO

## 2019-07-26 ENCOUNTER — Other Ambulatory Visit (HOSPITAL_COMMUNITY)
Admission: RE | Admit: 2019-07-26 | Discharge: 2019-07-26 | Disposition: A | Discharge status: Home / Self Care | Payer: Medicaid Other | Source: Ambulatory Visit | Attending: Podiatry | Admitting: Podiatry

## 2019-07-26 DIAGNOSIS — Z20822 Contact with and (suspected) exposure to covid-19: Secondary | ICD-10-CM | POA: Insufficient documentation

## 2019-07-26 DIAGNOSIS — Z01812 Encounter for preprocedural laboratory examination: Secondary | ICD-10-CM | POA: Diagnosis present

## 2019-07-26 LAB — SARS CORONAVIRUS 2 (TAT 6-24 HRS): SARS Coronavirus 2: NEGATIVE

## 2019-07-28 ENCOUNTER — Telehealth: Payer: Self-pay

## 2019-07-28 NOTE — Telephone Encounter (Signed)
Helen RN from [facility] stating pt has moved to a different location and due to the pt moving they will have to discharge the pt. Bonnita Nasuti is requesting to know if Dr. March Rummage has started pt with a new home care company for his wound care? Please advice

## 2019-07-29 ENCOUNTER — Telehealth: Payer: Self-pay

## 2019-07-29 NOTE — Telephone Encounter (Signed)
Can we work on new agency for [facility] for patient?

## 2019-07-29 NOTE — Telephone Encounter (Signed)
Bonnita Nasuti RN and Sharyn Lull from [facility] stating pt has moved to a different location and due to the pt moving they will have to discharge the pt. Bonnita Nasuti and Sharyn Lull are requesting to know if Dr. March Rummage has started pt with a new home care company for his wound care?  -Per Dr. March Rummage, he would like to see if you can set him up with a new agency

## 2019-07-30 ENCOUNTER — Encounter (HOSPITAL_BASED_OUTPATIENT_CLINIC_OR_DEPARTMENT_OTHER): Payer: Self-pay | Admitting: Podiatry

## 2019-07-30 ENCOUNTER — Ambulatory Visit (HOSPITAL_BASED_OUTPATIENT_CLINIC_OR_DEPARTMENT_OTHER): Payer: Medicaid Other | Admitting: Physician Assistant

## 2019-07-30 ENCOUNTER — Other Ambulatory Visit: Payer: Self-pay

## 2019-07-30 ENCOUNTER — Ambulatory Visit (HOSPITAL_BASED_OUTPATIENT_CLINIC_OR_DEPARTMENT_OTHER)
Admission: RE | Admit: 2019-07-30 | Discharge: 2019-07-30 | Disposition: A | Discharge status: Home Health | Payer: Medicaid Other | Attending: Podiatry | Admitting: Podiatry

## 2019-07-30 ENCOUNTER — Encounter (HOSPITAL_BASED_OUTPATIENT_CLINIC_OR_DEPARTMENT_OTHER)
Admission: RE | Disposition: A | Discharge status: Home Health | Payer: Self-pay | Source: Home / Self Care | Attending: Podiatry

## 2019-07-30 DIAGNOSIS — T8131XA Disruption of external operation (surgical) wound, not elsewhere classified, initial encounter: Secondary | ICD-10-CM

## 2019-07-30 DIAGNOSIS — Z87891 Personal history of nicotine dependence: Secondary | ICD-10-CM | POA: Insufficient documentation

## 2019-07-30 DIAGNOSIS — L97514 Non-pressure chronic ulcer of other part of right foot with necrosis of bone: Secondary | ICD-10-CM | POA: Insufficient documentation

## 2019-07-30 DIAGNOSIS — L97519 Non-pressure chronic ulcer of other part of right foot with unspecified severity: Secondary | ICD-10-CM | POA: Diagnosis present

## 2019-07-30 DIAGNOSIS — E11621 Type 2 diabetes mellitus with foot ulcer: Secondary | ICD-10-CM | POA: Diagnosis not present

## 2019-07-30 DIAGNOSIS — M8610 Other acute osteomyelitis, unspecified site: Secondary | ICD-10-CM

## 2019-07-30 DIAGNOSIS — T8130XA Disruption of wound, unspecified, initial encounter: Secondary | ICD-10-CM

## 2019-07-30 DIAGNOSIS — M86671 Other chronic osteomyelitis, right ankle and foot: Secondary | ICD-10-CM

## 2019-07-30 DIAGNOSIS — I10 Essential (primary) hypertension: Secondary | ICD-10-CM | POA: Diagnosis not present

## 2019-07-30 HISTORY — DX: Nausea with vomiting, unspecified: R11.2

## 2019-07-30 HISTORY — DX: Anemia in chronic kidney disease: D63.1

## 2019-07-30 HISTORY — DX: Other complications of anesthesia, initial encounter: T88.59XA

## 2019-07-30 HISTORY — PX: IRRIGATION AND DEBRIDEMENT FOOT: SHX6602

## 2019-07-30 HISTORY — DX: Gastro-esophageal reflux disease without esophagitis: K21.9

## 2019-07-30 HISTORY — DX: Type 2 diabetes mellitus without complications: E11.9

## 2019-07-30 HISTORY — DX: Hyperlipidemia, unspecified: E78.5

## 2019-07-30 HISTORY — DX: Male erectile dysfunction, unspecified: N52.9

## 2019-07-30 HISTORY — DX: Personal history of other specified conditions: Z87.898

## 2019-07-30 HISTORY — DX: Chronic kidney disease, stage 3 unspecified: N18.30

## 2019-07-30 HISTORY — DX: Other specified postprocedural states: Z98.890

## 2019-07-30 LAB — GLUCOSE, CAPILLARY
Glucose-Capillary: 204 mg/dL — ABNORMAL HIGH (ref 70–99)
Glucose-Capillary: 156 mg/dL — ABNORMAL HIGH (ref 70–99)

## 2019-07-30 SURGERY — IRRIGATION AND DEBRIDEMENT FOOT
Anesthesia: Monitor Anesthesia Care | Site: Foot | Laterality: Right

## 2019-07-30 MED ORDER — ONDANSETRON HCL 4 MG/2ML IJ SOLN
INTRAMUSCULAR | Status: AC
  Filled 2019-07-30: qty 2

## 2019-07-30 MED ORDER — SCOPOLAMINE 1 MG/3DAYS TD PT72
MEDICATED_PATCH | TRANSDERMAL | Status: DC | PRN
  Administered 2019-07-30: 1 via TRANSDERMAL

## 2019-07-30 MED ORDER — ONDANSETRON HCL 4 MG/2ML IJ SOLN
INTRAMUSCULAR | Status: DC | PRN
Start: 2019-07-30 — End: 2019-07-30
  Administered 2019-07-30: 8 mg via INTRAVENOUS

## 2019-07-30 MED ORDER — LIDOCAINE 2% (20 MG/ML) 5 ML SYRINGE
INTRAMUSCULAR | Status: AC
  Filled 2019-07-30: qty 5

## 2019-07-30 MED ORDER — MIDAZOLAM HCL 5 MG/5ML IJ SOLN
INTRAMUSCULAR | Status: DC | PRN
  Administered 2019-07-30: 2 mg via INTRAVENOUS

## 2019-07-30 MED ORDER — BUPIVACAINE HCL (PF) 0.5 % IJ SOLN
INTRAMUSCULAR | Status: DC | PRN
  Administered 2019-07-30: 10 mL

## 2019-07-30 MED ORDER — PROPOFOL 10 MG/ML IV BOLUS
INTRAVENOUS | Status: AC
  Filled 2019-07-30: qty 20

## 2019-07-30 MED ORDER — VANCOMYCIN HCL 1 G IV SOLR
INTRAVENOUS | Status: DC | PRN
  Administered 2019-07-30: 1000 mg

## 2019-07-30 MED ORDER — METOCLOPRAMIDE HCL 5 MG/ML IJ SOLN
INTRAMUSCULAR | Status: DC | PRN
Start: 2019-07-30 — End: 2019-07-30
  Administered 2019-07-30: 10 mg via INTRAVENOUS

## 2019-07-30 MED ORDER — FENTANYL CITRATE (PF) 100 MCG/2ML IJ SOLN
INTRAMUSCULAR | Status: DC | PRN
  Administered 2019-07-30 (×4): 25 ug via INTRAVENOUS

## 2019-07-30 MED ORDER — SODIUM CHLORIDE 0.9 % IV SOLN
INTRAVENOUS | Status: DC
  Administered 2019-07-30: 50 mL/h via INTRAVENOUS

## 2019-07-30 MED ORDER — METOCLOPRAMIDE HCL 5 MG/ML IJ SOLN
INTRAMUSCULAR | Status: AC
  Filled 2019-07-30: qty 2

## 2019-07-30 MED ORDER — ONDANSETRON HCL 4 MG/2ML IJ SOLN: INTRAMUSCULAR | Status: DC | PRN

## 2019-07-30 MED ORDER — CEFAZOLIN SODIUM 1 G IJ SOLR
INTRAMUSCULAR | Status: AC
  Filled 2019-07-30: qty 10

## 2019-07-30 MED ORDER — DEXAMETHASONE SODIUM PHOSPHATE 10 MG/ML IJ SOLN
INTRAMUSCULAR | Status: AC
  Filled 2019-07-30: qty 1

## 2019-07-30 MED ORDER — CLINDAMYCIN HCL 300 MG PO CAPS
300.0000 mg | ORAL_CAPSULE | Freq: Two times a day (BID) | ORAL | 0 refills | Status: AC
Start: 2019-07-30 — End: 2019-08-06

## 2019-07-30 MED ORDER — FENTANYL CITRATE (PF) 100 MCG/2ML IJ SOLN: 25.0000 ug | INTRAMUSCULAR | Status: DC | PRN

## 2019-07-30 MED ORDER — CEFAZOLIN SODIUM-DEXTROSE 2-3 GM-%(50ML) IV SOLR
INTRAVENOUS | Status: DC | PRN
  Administered 2019-07-30: 2 g via INTRAVENOUS

## 2019-07-30 MED ORDER — PROPOFOL 10 MG/ML IV BOLUS
INTRAVENOUS | Status: DC | PRN
  Administered 2019-07-30 (×3): 20 mg via INTRAVENOUS

## 2019-07-30 MED ORDER — SCOPOLAMINE 1 MG/3DAYS TD PT72
MEDICATED_PATCH | TRANSDERMAL | Status: AC
  Filled 2019-07-30: qty 1

## 2019-07-30 MED ORDER — MIDAZOLAM HCL 2 MG/2ML IJ SOLN
INTRAMUSCULAR | Status: AC
  Filled 2019-07-30: qty 2

## 2019-07-30 MED ORDER — FENTANYL CITRATE (PF) 100 MCG/2ML IJ SOLN
INTRAMUSCULAR | Status: AC
  Filled 2019-07-30: qty 2

## 2019-07-30 SURGICAL SUPPLY — 62 items
BLADE SURG 15 STRL LF DISP TIS (BLADE) ×1 IMPLANT
BLADE SURG 15 STRL SS (BLADE) ×2
BNDG ELASTIC 3X5.8 VLCR STR LF (GAUZE/BANDAGES/DRESSINGS) ×3 IMPLANT
BNDG ELASTIC 4X5.8 VLCR STR LF (GAUZE/BANDAGES/DRESSINGS) ×3 IMPLANT
BNDG ESMARK 4X9 LF (GAUZE/BANDAGES/DRESSINGS) IMPLANT
BNDG GAUZE ELAST 4 BULKY (GAUZE/BANDAGES/DRESSINGS) ×3 IMPLANT
BONE CEMENT SIMPLEX TOBRAMYCIN (Cement) ×2 IMPLANT
BOWL SMART MIX CTS (DISPOSABLE) ×3 IMPLANT
CEMENT BONE SIMPLEX TOBRAMYCIN (Cement) ×1 IMPLANT
CHLORAPREP W/TINT 26 (MISCELLANEOUS) IMPLANT
CNTNR URN SCR LID CUP LEK RST (MISCELLANEOUS) IMPLANT
CONT SPEC 4OZ STRL OR WHT (MISCELLANEOUS)
COVER BACK TABLE 60X90IN (DRAPES) ×3 IMPLANT
COVER WAND RF STERILE (DRAPES) ×3 IMPLANT
CUFF TOURN SGL QUICK 18X4 (TOURNIQUET CUFF) IMPLANT
CUFF TOURN SGL QUICK 24 (TOURNIQUET CUFF)
CUFF TRNQT CYL 24X4X16.5-23 (TOURNIQUET CUFF) IMPLANT
DRAPE 3/4 80X56 (DRAPES) ×3 IMPLANT
DRAPE EXTREMITY T 121X128X90 (DISPOSABLE) ×3 IMPLANT
DRAPE SHEET LG 3/4 BI-LAMINATE (DRAPES) ×3 IMPLANT
DRAPE U-SHAPE 47X51 STRL (DRAPES) IMPLANT
DRSG VAC ATS MED SENSATRAC (GAUZE/BANDAGES/DRESSINGS) ×3 IMPLANT
ELECT REM PT RETURN 9FT ADLT (ELECTROSURGICAL) ×3
ELECTRODE REM PT RTRN 9FT ADLT (ELECTROSURGICAL) ×1 IMPLANT
GAUZE SPONGE 4X4 12PLY STRL (GAUZE/BANDAGES/DRESSINGS) ×3 IMPLANT
GAUZE XEROFORM 1X8 LF (GAUZE/BANDAGES/DRESSINGS) ×6 IMPLANT
GLOVE BIO SURGEON STRL SZ7.5 (GLOVE) ×3 IMPLANT
GLOVE BIOGEL PI IND STRL 8 (GLOVE) ×1 IMPLANT
GLOVE BIOGEL PI INDICATOR 8 (GLOVE) ×2
GOWN STRL REUS W/ TWL XL LVL3 (GOWN DISPOSABLE) ×1 IMPLANT
GOWN STRL REUS W/TWL XL LVL3 (GOWN DISPOSABLE) ×2
IV NS IRRIG 3000ML ARTHROMATIC (IV SOLUTION) ×6 IMPLANT
MANIFOLD NEPTUNE II (INSTRUMENTS) ×3 IMPLANT
NEEDLE HYPO 25X1 1.5 SAFETY (NEEDLE) ×3 IMPLANT
NS IRRIG 1000ML POUR BTL (IV SOLUTION) IMPLANT
NS IRRIG 500ML POUR BTL (IV SOLUTION) ×3 IMPLANT
PADDING CAST ABS 4INX4YD NS (CAST SUPPLIES)
PADDING CAST ABS COTTON 4X4 ST (CAST SUPPLIES) IMPLANT
PENCIL SMOKE EVAC W/HOLSTER (ELECTROSURGICAL) IMPLANT
SET BASIN DAY SURGERY F.S. (CUSTOM PROCEDURE TRAY) ×3 IMPLANT
SET IRRIG Y TYPE TUR BLADDER L (SET/KITS/TRAYS/PACK) IMPLANT
SPONGE LAP 4X18 RFD (DISPOSABLE) ×3 IMPLANT
STAPLER VISISTAT 35W (STAPLE) IMPLANT
STOCKINETTE 6  STRL (DRAPES)
STOCKINETTE 6 STRL (DRAPES) IMPLANT
SUCTION FRAZIER HANDLE 10FR (MISCELLANEOUS)
SUCTION TUBE FRAZIER 10FR DISP (MISCELLANEOUS) IMPLANT
SUT ETHILON 3 0 PS 1 (SUTURE) ×6 IMPLANT
SUT ETHILON 4 0 PS 2 18 (SUTURE) IMPLANT
SUT MNCRL AB 3-0 PS2 18 (SUTURE) IMPLANT
SUT MNCRL AB 4-0 PS2 18 (SUTURE) IMPLANT
SUT VIC AB 2-0 SH 27 (SUTURE)
SUT VIC AB 2-0 SH 27XBRD (SUTURE) IMPLANT
SWAB COLLECTION DEVICE MRSA (MISCELLANEOUS) ×3 IMPLANT
SWAB CULTURE ESWAB REG 1ML (MISCELLANEOUS) ×3 IMPLANT
SYR BULB EAR ULCER 3OZ GRN STR (SYRINGE) ×3 IMPLANT
SYR CONTROL 10ML LL (SYRINGE) ×3 IMPLANT
Stryker Simplex P with Tobramycin ×3 IMPLANT
TRAY DSU PREP LF (CUSTOM PROCEDURE TRAY) ×3 IMPLANT
TUBE IRRIGATION SET MISONIX (TUBING) IMPLANT
UNDERPAD 30X36 HEAVY ABSORB (UNDERPADS AND DIAPERS) ×6 IMPLANT
YANKAUER SUCT BULB TIP NO VENT (SUCTIONS) IMPLANT

## 2019-07-30 NOTE — Telephone Encounter (Signed)
Hi Valery, pt's address has been changed. Thanks

## 2019-07-30 NOTE — Discharge Instructions (Signed)
Non-weight bearing right foot.  Call Dr. Eleanora Neighbor office to reschedule your follow-up appointment to the [facility] office if that location is more convenient for you.   Post Anesthesia Home Care Instructions  Activity: Get plenty of rest for the remainder of the day. A responsible individual must stay with you for 24 hours following the procedure.  For the next 24 hours, DO NOT: -Drive a car -Paediatric nurse -Drink alcoholic beverages -Take any medication unless instructed by your physician -Make any legal decisions or sign important papers.  Meals: Start with liquid foods such as gelatin or soup. Progress to regular foods as tolerated. Avoid greasy, spicy, heavy foods. If nausea and/or vomiting occur, drink only clear liquids until the nausea and/or vomiting subsides. Call your physician if vomiting continues.  Special Instructions/Symptoms: Your throat may feel dry or sore from the anesthesia or the breathing tube placed in your throat during surgery. If this causes discomfort, gargle with warm salt water. The discomfort should disappear within 24 hours.  If you had a scopolamine patch placed behind your ear for the management of post- operative nausea and/or vomiting:  1. The medication in the patch is effective for 72 hours, after which it should be removed.  Wrap patch in a tissue and discard in the trash. Wash hands thoroughly with soap and water. 2. You may remove the patch earlier than 72 hours if you experience unpleasant side effects which may include dry mouth, dizziness or visual disturbances. 3. Avoid touching the patch. Wash your hands with soap and water after contact with the patch.

## 2019-07-30 NOTE — Interval H&P Note (Signed)
History and Physical Interval Note:  07/30/2019 10:09 AM  Jorge Higgins  has presented today for surgery, with the diagnosis of Right foot wound.  The various methods of treatment have been discussed with the patient and family. After consideration of risks, benefits and other options for treatment, the patient has consented to  Procedure(s) with comments: IRRIGATION AND DEBRIDEMENT FOOT, ANTIBIOTIC SPACER EXCHANGE, WOUND CLOSURE (Right) - Antibiotic cement, Vancomycin powder, pulse lavage as a surgical intervention.  The patient's history has been reviewed, patient examined, no change in status, stable for surgery.  I have reviewed the patient's chart and labs.  Questions were answered to the patient's satisfaction.     [name]

## 2019-07-30 NOTE — Transfer of Care (Signed)
Immediate Anesthesia Transfer of Care Note  Patient: Jorge Higgins  Procedure(s) Performed: Procedure(s) (LRB): IRRIGATION AND DEBRIDEMENT FOOT, ANTIBIOTIC SPACER EXCHANGE, WOUND CLOSURE (Right)  Patient Location: PACU  Anesthesia Type: MAC  Level of Consciousness: awake, sedated, patient cooperative and responds to stimulation  Airway & Oxygen Therapy: Patient Spontanous Breathing and Patient connected to [place] 02 and soft FM  Post-op Assessment: Report given to PACU RN, Post -op Vital signs reviewed and stable and Patient moving all extremities  Post vital signs: Reviewed and stable  Complications: No apparent anesthesia complications

## 2019-07-30 NOTE — Anesthesia Postprocedure Evaluation (Signed)
Anesthesia Post Note  Patient: Jorge Higgins  Procedure(s) Performed: IRRIGATION AND DEBRIDEMENT FOOT, ANTIBIOTIC SPACER EXCHANGE, WOUND CLOSURE (Right Foot)     Patient location during evaluation: PACU Anesthesia Type: General Level of consciousness: awake Pain management: pain level controlled Vital Signs Assessment: post-procedure vital signs reviewed and stable Respiratory status: spontaneous breathing Cardiovascular status: stable Postop Assessment: no apparent nausea or vomiting Anesthetic complications: no    Last Vitals:  Vitals:   07/30/19 1145 07/30/19 1200  BP: 135/90 (!) 130/91  Pulse: 82 79  Resp: 13 11  Temp:    SpO2: 98% 93%    Last Pain:  Vitals:   07/30/19 1130  TempSrc:   PainSc: 0-No pain                 Laelynn Blizzard

## 2019-07-30 NOTE — Op Note (Signed)
Patient Name: Jorge Higgins DOB: 07/20/1975  MRN: [id number]   Date of Service: 07/30/2019  Surgeon: Dr. Hardie Pulley, DPM Assistants: None Pre-operative Diagnosis:  Ulcer of right foot Post-operative Diagnosis:  none Procedures:  1) Debridement of right foot wound  2) Removal and reinsertion of abx spacer  3) Partial wound closure  4) Wound VAC application Pathology/Specimens: ID Type Source Tests Collected by Time Destination  A : Swab culture deep right foot Wound Wound AEROBIC/ANAEROBIC CULTURE (SURGICAL/DEEP WOUND) [name], DPM 07/30/2019 1055    Anesthesia: MAC Hemostasis: anatomic Estimated Blood Loss: 10 mL Materials: * No implants in log * Medications: 1g Vancomycin (as part of spacer) Complications: none.   Indications for Procedure:  This is a 44 y.o. male with a chronic right foot ulcer. It has been improving but with prominent spacer. It was discussed he would benefit from debridement, spacer exchange, and partial closure with VAC. All risks, benefits, and alternatives were discussed no guarantees given.   Procedure in Detail: Patient was identified in pre-operative holding area. Formal consent was signed and the right lower extremity was marked. Patient was brought back to the operating room. Anesthesia was induced. The extremity was prepped and draped in the usual sterile fashion. Timeout was taken to confirm patient name, laterality, and procedure prior to incision.   Attention was then directed to the right foot. The right foot was anesthetized, prepped and draped. The antibiotic spacer was removed prior after induction of anesthesia and prior to prepping to ensure better contact with the prepping solution.  Attention was then directed to the right foot. The right foot wound measured 5x1x2 cm. A deep culture of the wound was taken. No exposed bone in the wound was noted. The wound was sharply excisionally debrided with a 15 blade and curette to  bleeding viable wound. The wound was then copiously thoroughly irrigated with 3L of normal saline via pulse lavage. The wound at this time measures 6x2x2.  A small vancomycin/tobramycin spacer was fashioned, allowed to set, and placed in the void from the previous excised bone area. The skin edges were then gently reapproximated with 3-0 nylon mattress retention suture. A black sponge for a wound VAC was packed into the wound. This was then adhered with the transparent dressing. This was pierced, track pad placed, and set to suction with good seal noted.  The foot was then dressed with 4x4, kerlix and ACE bandage. Patient tolerated the procedure well.   Disposition: Following a period of post-operative monitoring, patient will be transferred home.

## 2019-07-30 NOTE — Anesthesia Procedure Notes (Signed)
Procedure Name: MAC Date/Time: 07/30/2019 10:43 AM Performed by: Justice Rocher, CRNA Pre-anesthesia Checklist: Patient identified, Emergency Drugs available, Suction available, Patient being monitored and Timeout performed Patient Re-evaluated:Patient Re-evaluated prior to induction Oxygen Delivery Method: Nasal cannula Preoxygenation: Pre-oxygenation with 100% oxygen Induction Type: IV induction Placement Confirmation: positive ETCO2,  CO2 detector and breath sounds checked- equal and bilateral

## 2019-07-30 NOTE — Anesthesia Preprocedure Evaluation (Addendum)
Anesthesia Evaluation  Patient identified by MRN, date of birth, ID band Patient awake    History of Anesthesia Complications (+) PONV  Airway Mallampati: II  TM Distance: >3 FB     Dental   Pulmonary former smoker,    breath sounds clear to auscultation       Cardiovascular hypertension,  Rhythm:Regular Rate:Normal     Neuro/Psych    GI/Hepatic GERD  ,  Endo/Other  diabetes  Renal/GU Renal disease     Musculoskeletal   Abdominal   Peds  Hematology  (+) anemia ,   Anesthesia Other Findings   Reproductive/Obstetrics                            Anesthesia Physical Anesthesia Plan  ASA: III  Anesthesia Plan: MAC   Post-op Pain Management:    Induction: Intravenous  PONV Risk Score and Plan: 3 and Ondansetron, Dexamethasone and Midazolam  Airway Management Planned: Nasal Cannula and Simple Face Mask  Additional Equipment:   Intra-op Plan:   Post-operative Plan: Extubation in OR  Informed Consent: I have reviewed the patients History and Physical, chart, labs and discussed the procedure including the risks, benefits and alternatives for the proposed anesthesia with the patient or authorized representative who has indicated his/her understanding and acceptance.     Dental advisory given  Plan Discussed with: CRNA and Anesthesiologist  Anesthesia Plan Comments:        Anesthesia Quick Evaluation

## 2019-07-31 NOTE — Telephone Encounter (Addendum)
Faxed required form, clinicals and demographics to [facility]. [facility] [place] 08/01/3019 states they do not have the staffing for wound vac care pt.

## 2019-07-31 NOTE — Telephone Encounter (Signed)
Wound VAC three times weekly

## 2019-08-01 NOTE — Telephone Encounter (Signed)
Pt has an appt 08/04/19 @ 1:15pm

## 2019-08-01 NOTE — Telephone Encounter (Signed)
Faxed required form, clinicals and demographics to [facility].

## 2019-08-01 NOTE — Telephone Encounter (Signed)
[facility] states they do not have the staffing.

## 2019-08-01 NOTE — Telephone Encounter (Signed)
Faxed required form, clinicals and demographics to [facility] with request for L. Beryl Meager to call with status of acceptance.

## 2019-08-01 NOTE — Telephone Encounter (Signed)
[facility] - receptionist states they do not have staffing for wound vac care pt.

## 2019-08-02 ENCOUNTER — Encounter: Payer: Self-pay | Admitting: Anesthesiology

## 2019-08-02 NOTE — Addendum Note (Signed)
Addendum  created 08/02/19 0909 by Belinda Block, MD   SmartForm saved

## 2019-08-04 ENCOUNTER — Encounter: Payer: Medicaid Other | Admitting: Podiatry

## 2019-08-04 ENCOUNTER — Ambulatory Visit (INDEPENDENT_AMBULATORY_CARE_PROVIDER_SITE_OTHER): Payer: Medicaid Other | Admitting: Podiatry

## 2019-08-04 ENCOUNTER — Other Ambulatory Visit: Payer: Self-pay

## 2019-08-04 DIAGNOSIS — L97514 Non-pressure chronic ulcer of other part of right foot with necrosis of bone: Secondary | ICD-10-CM

## 2019-08-04 DIAGNOSIS — E13621 Other specified diabetes mellitus with foot ulcer: Secondary | ICD-10-CM | POA: Diagnosis not present

## 2019-08-04 LAB — AEROBIC/ANAEROBIC CULTURE W GRAM STAIN (SURGICAL/DEEP WOUND): Culture: NORMAL

## 2019-08-05 ENCOUNTER — Telehealth: Payer: Self-pay | Admitting: Podiatry

## 2019-08-05 ENCOUNTER — Telehealth: Payer: Self-pay | Admitting: *Deleted

## 2019-08-05 NOTE — Telephone Encounter (Signed)
[facility] spoke to  [place]  And she  stated that they will not be able to take the case for this pt  due to staffing.Marland Kitchen

## 2019-08-05 NOTE — Telephone Encounter (Signed)
Required form, clinicals and demographics faxed to [facility] care.

## 2019-08-05 NOTE — Telephone Encounter (Signed)
[facility] states they do not have the staffing for pt's wound vac therapy.

## 2019-08-05 NOTE — Telephone Encounter (Signed)
Entered in error

## 2019-08-05 NOTE — Telephone Encounter (Signed)
[facility] states can not accept pt due to staffing. This message was sent to Dr. March Rummage and schedulers in another 08/05/2019 Telephone Call message.

## 2019-08-06 NOTE — Telephone Encounter (Signed)
Ok thanks [employer] discuss with patient

## 2019-08-07 ENCOUNTER — Telehealth: Payer: Self-pay

## 2019-08-07 NOTE — Telephone Encounter (Signed)
Pt LVM stating he is concern why Home health has not contacted him for wound care. Pt states he has an appt tomorrow but is concern about the wound vac that started leaking last night, pt states he took it off and applied a bandage. Pt states the wound vac only lasts 2 days without leaking

## 2019-08-08 ENCOUNTER — Other Ambulatory Visit: Payer: Self-pay

## 2019-08-08 ENCOUNTER — Ambulatory Visit (INDEPENDENT_AMBULATORY_CARE_PROVIDER_SITE_OTHER): Payer: Medicaid Other | Admitting: Podiatry

## 2019-08-08 VITALS — Temp 97.0°F

## 2019-08-08 DIAGNOSIS — L97514 Non-pressure chronic ulcer of other part of right foot with necrosis of bone: Secondary | ICD-10-CM

## 2019-08-08 DIAGNOSIS — E13621 Other specified diabetes mellitus with foot ulcer: Secondary | ICD-10-CM

## 2019-08-08 NOTE — Progress Notes (Signed)
  Subjective:  Patient ID: Singleton Chuck Hint, male    DOB: 1975-06-30,  MRN: [id number]  Chief Complaint  Patient presents with  . Wound Check    R plantar midfoot. Pt stated, "Took the wound vac off yesterday and washed the boot. No pain/fever/chills/N&V/pus/foul odors/significantly abnormal glucose. Using crutches".   44 y.o. male presents for wound care. Hx confirmed with patient.  Objective:  Physical Exam: Wound Location: Right lateral foot Wound Measurement: 3.5x1 Wound Base: Granular/Healthy Peri-wound: macerated Exudate: Scant/small amount Serosanguinous exudate wound without warmth, erythema, signs of acute infection and appears improved compared to last recheck  Assessment:   1. 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  Ulcer Right lateral foot -Wound cleansed. -Wound VAC reapplied  Procedure: Wound VAC Application Location: Right foot Wound Measurement: 3.5 cm x 1 cm x 1.5 cm  Technique: Black foam to wound base, followed by adherent dressing. Set to 125 mmHg with good seal noted. Disposition: Patient tolerated procedure well.  No follow-ups on file.

## 2019-08-11 ENCOUNTER — Ambulatory Visit (INDEPENDENT_AMBULATORY_CARE_PROVIDER_SITE_OTHER): Payer: Medicaid Other | Admitting: Podiatry

## 2019-08-11 ENCOUNTER — Encounter: Payer: Medicaid Other | Admitting: Podiatry

## 2019-08-11 ENCOUNTER — Other Ambulatory Visit: Payer: Self-pay

## 2019-08-11 DIAGNOSIS — L97514 Non-pressure chronic ulcer of other part of right foot with necrosis of bone: Secondary | ICD-10-CM | POA: Diagnosis not present

## 2019-08-11 DIAGNOSIS — E13621 Other specified diabetes mellitus with foot ulcer: Secondary | ICD-10-CM

## 2019-08-11 NOTE — Progress Notes (Signed)
  Subjective:  Patient ID: Calel Chuck Hint, male    DOB: February 28, 1976,  MRN: [id number]  Chief Complaint  Patient presents with  . Wound Check    F/U for wound vac change   44 y.o. male presents for wound care. Hx confirmed with patient.  Objective:  Physical Exam: Wound Location: Right lateral foot Wound Measurement: 3x1 Wound Base: Granular/Healthy Peri-wound: macerated Exudate: Scant/small amount Serosanguinous exudate wound without warmth, erythema, signs of acute infection and appears improved compared to last recheck  Assessment:   1. 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  Ulcer Right lateral foot -Wound cleansed. -Wound VAC reapplied  Procedure: Wound VAC Application Location: right lateral foot Wound Measurement: 3 cm x 1 cm x 0.4 cm  Technique: Black foam to wound base, followed by adherent dressing. Set to 125 mmHg with good seal noted. Disposition: Patient tolerated procedure well.   Return in about 1 week (around 08/18/2019).

## 2019-08-15 ENCOUNTER — Ambulatory Visit (INDEPENDENT_AMBULATORY_CARE_PROVIDER_SITE_OTHER): Payer: Medicaid Other | Admitting: Podiatry

## 2019-08-15 ENCOUNTER — Other Ambulatory Visit: Payer: Self-pay

## 2019-08-15 DIAGNOSIS — L97514 Non-pressure chronic ulcer of other part of right foot with necrosis of bone: Secondary | ICD-10-CM | POA: Diagnosis not present

## 2019-08-15 DIAGNOSIS — E13621 Other specified diabetes mellitus with foot ulcer: Secondary | ICD-10-CM

## 2019-08-15 NOTE — Progress Notes (Signed)
  Subjective:  Patient ID: Jorge Higgins, male    DOB: 06-Mar-1976,  MRN: [id number]  Chief Complaint  Patient presents with  . Routine Post Op    POV#2 DOS 4.28.2021 RT FOOT DEBRIDEMENT & IRRIGATION, REPLACEMENT OF ANTIBIOTIC SPACER, WOUND CLOSURE. Pt states healing and denies any concerns. Denies fever/chills/nausea//vomitin..   44 y.o. male presents for wound care. Hx confirmed with patient.  Objective:  Physical Exam: Wound Location: Right lateral foot Wound Measurement: 3x1 Wound Base: Granular/Healthy Peri-wound: macerated Exudate: Scant/small amount Serosanguinous exudate wound without warmth, erythema, signs of acute infection and appears improved compared to last recheck  Assessment:   1. 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  Ulcer Right lateral foot -Wound cleansed and debrided -Removed abx spacer -Wound vac reapplied  Procedure: Selective Debridement of Wound Rationale: Removal of devitalized tissue from the wound to promote healing.  Pre-Debridement Wound Measurements: 3 cm x 1 cm x 0.5 cm  Post-Debridement Wound Measurements: same as pre-debridement. Type of Debridement: sharp selective Tissue Removed: Devitalized soft-tissue Dressing: Dry, sterile, compression dressing. Disposition: Patient tolerated procedure well. Patient to return in 1 week for follow-up.   Procedure: Wound VAC Application Location: right lateral  Wound Measurement: 3 cm x 1 cm x 0.2 cm  Technique: Black foam to wound base, followed by adherent dressing. Set to 125 mmHg with good seal noted. Disposition: Patient tolerated procedure well.  No follow-ups on file.

## 2019-08-18 ENCOUNTER — Encounter: Payer: Medicaid Other | Admitting: Podiatry

## 2019-08-18 ENCOUNTER — Ambulatory Visit (INDEPENDENT_AMBULATORY_CARE_PROVIDER_SITE_OTHER): Payer: Medicaid Other | Admitting: Podiatry

## 2019-08-18 ENCOUNTER — Other Ambulatory Visit: Payer: Self-pay

## 2019-08-18 DIAGNOSIS — L97514 Non-pressure chronic ulcer of other part of right foot with necrosis of bone: Secondary | ICD-10-CM | POA: Diagnosis not present

## 2019-08-18 NOTE — Progress Notes (Signed)
  Subjective:  Patient ID: Jorge Higgins, male    DOB: December 06, 1975,  MRN: [id number]  No chief complaint on file.  44 y.o. male presents for wound care. Hx confirmed with patient. Thinks the wound is improving denies issues. Objective:  Physical Exam: Wound Location: right lateral Wound Measurement: 5x1x1 Wound Base: Granular/Healthy Peri-wound: Macerated Exudate: Scant/small amount Serosanguinous exudate wound without warmth, erythema, signs of acute infection and appears improved compared to last recheck  Assessment:   1. 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  Ulcer right foot -Wound cleansed and debrided -Wound VAC reapplied  Procedure: Selective Debridement of Wound Rationale: Removal of devitalized tissue from the wound to promote healing.  Pre-Debridement Wound Measurements: 5 cm x 1 cm x 1 cm  Post-Debridement Wound Measurements: same as pre-debridement. Type of Debridement: sharp selective Tissue Removed: Devitalized soft-tissue Dressing: Dry, sterile, compression dressing. Disposition: Patient tolerated procedure well. Patient to return in 1 week for follow-up.  Procedure: Wound VAC Application Location: right lateral Wound Measurement: 5 cm x 1 cm x 1 cm  Technique: Black foam to wound base, followed by adherent dressing. Set to 125 mmHg with good seal noted. Disposition: Patient tolerated procedure well.  No follow-ups on file.

## 2019-08-21 ENCOUNTER — Ambulatory Visit (INDEPENDENT_AMBULATORY_CARE_PROVIDER_SITE_OTHER): Payer: Medicaid Other | Admitting: Podiatry

## 2019-08-21 ENCOUNTER — Other Ambulatory Visit: Payer: Self-pay

## 2019-08-21 DIAGNOSIS — L97514 Non-pressure chronic ulcer of other part of right foot with necrosis of bone: Secondary | ICD-10-CM

## 2019-08-21 DIAGNOSIS — E13621 Other specified diabetes mellitus with foot ulcer: Secondary | ICD-10-CM

## 2019-08-21 NOTE — Progress Notes (Signed)
  Subjective:  Patient ID: Durk Chuck Hint, male    DOB: [birth year],  MRN: [id number]  Chief Complaint  Patient presents with  . Routine Post Op    POV #3 DOS 07/30/19 RT FOOT DEBRIDEMENT & IRRIGATION, REPLACEMENT OF ANTIBIOTIC SPACER, WOUND CLOSURE    44 y.o. male presents for wound care. Hx confirmed with patient.  Objective:  Physical Exam: Wound Location: right lateral Wound Measurement: 4.5x0.5x1 Wound Base: Granular/Healthy Peri-wound: Macerated Exudate: Scant/small amount Serosanguinous exudate wound without warmth, erythema, signs of acute infection  Assessment:   1. 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  Ulcer Right lateral foot -Wound cleansed and VAC reapplied  Procedure: Wound VAC Application Location: right lateral Wound Measurement: 4.5 cm x 0.5 cm x 1 cm  Technique: Black foam to wound base, followed by adherent dressing. Set to 125 mmHg with good seal noted. Disposition: Patient tolerated procedure well.  No follow-ups on file.

## 2019-08-25 ENCOUNTER — Ambulatory Visit (INDEPENDENT_AMBULATORY_CARE_PROVIDER_SITE_OTHER): Payer: Medicaid Other | Admitting: Podiatry

## 2019-08-25 ENCOUNTER — Other Ambulatory Visit: Payer: Self-pay

## 2019-08-25 ENCOUNTER — Encounter: Payer: Medicaid Other | Admitting: Podiatry

## 2019-08-25 DIAGNOSIS — E13621 Other specified diabetes mellitus with foot ulcer: Secondary | ICD-10-CM

## 2019-08-25 DIAGNOSIS — L97514 Non-pressure chronic ulcer of other part of right foot with necrosis of bone: Secondary | ICD-10-CM

## 2019-08-25 NOTE — Progress Notes (Signed)
  Subjective:  Patient ID: Jorge Higgins, male    DOB: December 28, 1975,  MRN: [id number]  Chief Complaint  Patient presents with  . Wound Check    F/U Rt wound check and wound vac chagne -pt states," closin up." -pt dnies pain/swelling/redness -pt states that he bearly has any draiange Tx: betaind and dressing     44 y.o. male presents for wound care. Hx confirmed with patient.  Objective:  Physical Exam: Wound Location: right lateral Wound Measurement: 4x0.5x0.5 Wound Base: Granular/Healthy Peri-wound: Macerated Exudate: Scant/small amount Serosanguinous exudate wound without warmth, erythema, signs of acute infection Assessment:   1. 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  Ulcer right foot -Wound cleansed and debrided -Wound VAC reapplied.  Procedure: Selective Debridement of Wound Rationale: Removal of devitalized tissue from the wound to promote healing.  Pre-Debridement Wound Measurements: 4 cm x 0.5 cm x 0.5 cm  Post-Debridement Wound Measurements: same as pre-debridement. Type of Debridement: sharp selective Tissue Removed: Devitalized soft-tissue Dressing: Dry, sterile, compression dressing. Disposition: Patient tolerated procedure well. Patient to return in 1 week for follow-up.  Procedure: Wound VAC Application Location: right lateral Wound Measurement: 4 cm x 0.5 cm x 0.5 cm  Technique: Black foam to wound base, followed by adherent dressing. Set to 125 mmHg with good seal noted. Disposition: Patient tolerated procedure well.  No follow-ups on file.

## 2019-08-28 ENCOUNTER — Ambulatory Visit (INDEPENDENT_AMBULATORY_CARE_PROVIDER_SITE_OTHER): Payer: Medicaid Other | Admitting: Podiatry

## 2019-08-28 ENCOUNTER — Other Ambulatory Visit: Payer: Self-pay

## 2019-08-28 DIAGNOSIS — E13621 Other specified diabetes mellitus with foot ulcer: Secondary | ICD-10-CM

## 2019-08-28 DIAGNOSIS — L97514 Non-pressure chronic ulcer of other part of right foot with necrosis of bone: Secondary | ICD-10-CM | POA: Diagnosis not present

## 2019-08-28 NOTE — Progress Notes (Signed)
  Subjective:  Patient ID: Jorge Higgins, male    DOB: February 21, 1976,  MRN: [id number]  No chief complaint on file.   44 y.o. male presents for wound care. Hx confirmed with patient.  Objective:  Physical Exam: Wound Location: right lateral Wound Measurement: 5 x 1 x 1 Wound Base: Granular/Healthy Peri-wound: Macerated Exudate: Scant/small amount Serosanguinous exudate wound without warmth, erythema, signs of acute infection Assessment:   1. 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  Ulcer right foot -Wound continues to improve and granulate in well -Wound cleansed and debrided wound VAC reapplied  Procedure: Selective Debridement of Wound Rationale: Removal of devitalized tissue from the wound to promote healing.  Pre-Debridement Wound Measurements: 5 cm x 1 cm x 1 cm  Post-Debridement Wound Measurements: same as pre-debridement. Type of Debridement: sharp selective Tissue Removed: Devitalized soft-tissue Dressing: Dry, sterile, compression dressing. Disposition: Patient tolerated procedure well. Patient to return in 1 week for follow-up.   Procedure: Wound VAC Application Location: Right lateral foot Wound Measurement: 5 x 1 x 1 cm  Technique: Black foam to wound base, followed by adherent dressing. Set to 125 mmHg with good seal noted. Disposition: Patient tolerated procedure well.   No follow-ups on file.

## 2019-09-04 ENCOUNTER — Ambulatory Visit (INDEPENDENT_AMBULATORY_CARE_PROVIDER_SITE_OTHER): Payer: Medicaid Other | Admitting: Podiatry

## 2019-09-04 ENCOUNTER — Other Ambulatory Visit: Payer: Self-pay

## 2019-09-04 ENCOUNTER — Ambulatory Visit: Payer: Medicaid Other | Admitting: Podiatry

## 2019-09-04 DIAGNOSIS — E13621 Other specified diabetes mellitus with foot ulcer: Secondary | ICD-10-CM | POA: Diagnosis not present

## 2019-09-04 DIAGNOSIS — L97514 Non-pressure chronic ulcer of other part of right foot with necrosis of bone: Secondary | ICD-10-CM

## 2019-09-04 NOTE — Progress Notes (Signed)
  Subjective:  Patient ID: Safwan Chuck Hint, male    DOB: 1976/01/06,  MRN: [id number]  Chief Complaint  Patient presents with  . Wound Check    Pt states doing well no concerns denies fever/chills/nausea/vomiting.   44 y.o. male presents for wound care. Hx confirmed with patient.  Objective:  Physical Exam: Wound Location: right lateral Wound Measurement:4.5x0.5x0.8 Wound Base: Granular/Healthy Peri-wound: Macerated Exudate: Scant/small amount Serosanguinous exudate wound without warmth, erythema, signs of acute infection Assessment:   1. 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  Ulcer right foot -Wound continues to improve and granulate in well -Should not need VAC for much longer -Wound VAC reapplied.  Procedure: Wound VAC Application Location: right foot Wound Measurement: 4.5 cm x 0.5 cm x 0.8 cm  Technique: Black foam to wound base, followed by adherent dressing. Set to 125 mmHg with good seal noted. Disposition: Patient tolerated procedure well.   No follow-ups on file.

## 2019-09-08 ENCOUNTER — Ambulatory Visit: Payer: Self-pay | Admitting: Podiatry

## 2019-09-08 ENCOUNTER — Ambulatory Visit (INDEPENDENT_AMBULATORY_CARE_PROVIDER_SITE_OTHER): Payer: Medicaid Other | Admitting: Podiatry

## 2019-09-08 ENCOUNTER — Other Ambulatory Visit: Payer: Self-pay

## 2019-09-08 DIAGNOSIS — L97514 Non-pressure chronic ulcer of other part of right foot with necrosis of bone: Secondary | ICD-10-CM

## 2019-09-08 DIAGNOSIS — E13621 Other specified diabetes mellitus with foot ulcer: Secondary | ICD-10-CM

## 2019-09-08 NOTE — Progress Notes (Signed)
  Subjective:  Patient ID: Jorge Higgins, male    DOB: 1975-06-16,  MRN: [id number]  Chief Complaint  Patient presents with  . Foot Ulcer    F/U Rt wound check - pt states he is feel ing better -less drainage and redness -w/ swellign    44 y.o. male presents for wound care. Hx confirmed with patient.  Objective:  Physical Exam: Wound Location: right lateral Wound Measurement:4.5x0.5x0.5 Wound Base: Granular/Healthy Peri-wound: Macerated Exudate: Scant/small amount Serosanguinous exudate wound without warmth, erythema, signs of acute infection Assessment:   1. 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  Ulcer right foot -Wound continues to improve. Packed with prisma today. Hold off wound VAC at this time. -Plan for wound closure next week. -Rx for CAM boot to allow WB. -Consent reviewed  -Patient has failed all conservative therapy and wishes to proceed with surgical intervention. All risks, benefits, and alternatives discussed with patient. No guarantees given. Consent reviewed and signed by patient. -Planned procedures: right foot wound debridement and closure.   No follow-ups on file.

## 2019-09-11 ENCOUNTER — Ambulatory Visit: Payer: Medicaid Other | Admitting: Podiatry

## 2019-09-11 ENCOUNTER — Other Ambulatory Visit: Payer: Self-pay

## 2019-09-11 DIAGNOSIS — M21171 Varus deformity, not elsewhere classified, right ankle: Secondary | ICD-10-CM

## 2019-09-11 DIAGNOSIS — L97514 Non-pressure chronic ulcer of other part of right foot with necrosis of bone: Secondary | ICD-10-CM

## 2019-09-11 NOTE — Progress Notes (Signed)
°  Subjective:  Patient ID: Matteus Chuck Hint, male    DOB: 06/26/1975,  MRN: [id number]  Chief Complaint  Patient presents with   Foot Ulcer    Pt doignbetter, completed the antiobiotics doses- no f/c/n/v/sob/cp. pt states he is ready to get off crutches. overall doing better    44 y.o. male presents for wound care. Hx confirmed with patient.  Objective:  Physical Exam: Wound Location: right lateral Wound Measurement: 4x0.5x0.5 Wound Base: Granular/Healthy Peri-wound: Macerated Exudate: Scant/small amount Serosanguinous exudate wound without warmth, erythema, signs of acute infection Assessment:   1. 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 Acquired varus deformity of right ankle    Plan:  Patient was evaluated and treated and all questions answered.  Ulcer right foot -Wound improving.  Thoroughly cleansed wound cleaned out with cotton tip applicators.  Packed with Prisma.  Dressed with DSD.  Patient instructed to cleanse the area daily with sterile cotton applicators and dressed with sterile dressing. -Plan for wound debridement and closure next week -Discussed with Ortho discussed options for bracing agreement was on an [place] brace.  Rx written for [facility]  No follow-ups on file.

## 2019-09-12 ENCOUNTER — Encounter (HOSPITAL_COMMUNITY): Payer: Self-pay

## 2019-09-12 ENCOUNTER — Other Ambulatory Visit: Payer: Self-pay

## 2019-09-12 ENCOUNTER — Encounter (HOSPITAL_COMMUNITY)
Admission: RE | Admit: 2019-09-12 | Discharge: 2019-09-12 | Disposition: A | Discharge status: Home / Self Care | Payer: Medicaid Other | Source: Ambulatory Visit | Attending: Podiatry | Admitting: Podiatry

## 2019-09-12 DIAGNOSIS — Z01812 Encounter for preprocedural laboratory examination: Secondary | ICD-10-CM | POA: Insufficient documentation

## 2019-09-12 HISTORY — DX: Depression, unspecified: F32.A

## 2019-09-12 NOTE — Patient Instructions (Addendum)
DUE TO COVID-19 ONLY ONE VISITOR IS ALLOWED TO COME WITH YOU AND STAY IN THE WAITING ROOM ONLY DURING PRE OP AND PROCEDURE DAY OF SURGERY. THE 1 VISITOR MAY VISIT WITH YOU AFTER SURGERY IN YOUR PRIVATE ROOM DURING VISITING HOURS ONLY!  YOU NEED TO HAVE A COVID 19 TEST ON: 09/16/19 @ 8:20 am, THIS TEST MUST BE DONE BEFORE SURGERY, COME  [name], [place] , [id number].  ([place]) ONCE YOUR COVID TEST IS COMPLETED, PLEASE BEGIN THE QUARANTINE INSTRUCTIONS AS OUTLINED IN YOUR HANDOUT.                Jorge Higgins    Your procedure is scheduled on: 09/19/19   Report to [facility] Main  Entrance   Report to admitting at: 2:20 PM     Call this number if you have problems the morning of surgery [phone]    Remember: Do not eat solid food :After Midnight. Clear liquids from midnight until 1:30 pm.   CLEAR LIQUID DIET   Foods Allowed                                                                     Foods Excluded  Coffee and tea, regular and decaf                             liquids that you cannot  Plain Jell-O any favor except red or purple                                           see through such as: Fruit ices (not with fruit pulp)                                     milk, soups, orange juice  Iced Popsicles                                    All solid food Carbonated beverages, regular and diet                                    Cranberry, grape and apple juices Sports drinks like Gatorade Lightly seasoned clear broth or consume(fat free) Sugar, honey syrup  Sample Menu Breakfast                                Lunch                                     Supper Cranberry juice                    Beef broth  Chicken broth Jell-O                                     Grape juice                           Apple juice Coffee or tea                        Jell-O                                      Popsicle                                                 Coffee or tea                        Coffee or tea  _____________________________________________________________________  BRUSH YOUR TEETH MORNING OF SURGERY AND RINSE YOUR MOUTH OUT, NO CHEWING GUM CANDY OR MINTS.     Take these medicines the morning of surgery with A SIP OF WATER: amlodipine,pantoprazole,flagyl,clindamycin  How to Manage Your Diabetes Before and After Surgery  Why is it important to control my blood sugar before and after surgery?  Improving blood sugar levels before and after surgery helps healing and can limit problems.  A way of improving blood sugar control is eating a healthy diet by: o  Eating less sugar and carbohydrates o  Increasing activity/exercise o  Talking with your doctor about reaching your blood sugar goals  High blood sugars (greater than 180 mg/dL) can raise your risk of infections and slow your recovery, so you will need to focus on controlling your diabetes during the weeks before surgery.  Make sure that the doctor who takes care of your diabetes knows about your planned surgery including the date and location.  How do I manage my blood sugar before surgery?  Check your blood sugar at least 4 times a day, starting 2 days before surgery, to make sure that the level is not too high or low. o Check your blood sugar the morning of your surgery when you wake up and every 2 hours until you get to the Short Stay unit.  If your blood sugar is less than 70 mg/dL, you will need to treat for low blood sugar: o Do not take insulin. o Treat a low blood sugar (less than 70 mg/dL) with  cup of clear juice (cranberry or apple), 4 glucose tablets, OR glucose gel. o Recheck blood sugar in 15 minutes after treatment (to make sure it is greater than 70 mg/dL). If your blood sugar is not greater than 70 mg/dL on recheck, call [phone] for further instructions.  Report your blood sugar to the short stay nurse when you  get to Short Stay.   If you are admitted to the hospital after surgery: o Your blood sugar will be checked by the staff and you will probably be given insulin after surgery (instead of oral diabetes medicines) to make sure you have good blood sugar levels. o The goal for blood sugar control after surgery is 80-180 mg/dL.  WHAT DO I DO ABOUT MY DIABETES MEDICATION?   Do not take oral diabetes medicines (pills) the morning of surgery.   THE DAY BEFORE SURGERY, take morning dose of glipizide as usual. DO NOT TAKE the evening dose of glipizide. At night take half of the usual Lantus dose.        THE MORNING OF SURGERY, DO NOT TAKE ANY DIABETES MEDICINE.                                 You may not have any metal on your body including hair pins and              piercings  Do not wear jewelry, lotions, powders or perfumes, deodorant             Men may shave face and neck.   Do not bring valuables to the hospital. [place].  Contacts, dentures or bridgework may not be worn into surgery.  Leave suitcase in the car. After surgery it may be brought to your room.     Patients discharged the day of surgery will not be allowed to drive home. IF YOU ARE HAVING SURGERY AND GOING HOME THE SAME DAY, YOU MUST HAVE AN ADULT TO DRIVE YOU HOME AND BE WITH YOU FOR 24 HOURS. YOU MAY GO HOME BY TAXI OR UBER OR ORTHERWISE, BUT AN ADULT MUST ACCOMPANY YOU HOME AND STAY WITH YOU FOR 24 HOURS.  Name and phone number of your driver:  Special Instructions: N/A              Please read over the following fact sheets you were given: _____________________________________________________________________  [facility] - Preparing for Surgery Before surgery, you can play an important role.  Because skin is not sterile, your skin needs to be as free of germs as possible.  You can reduce the number of germs on your skin by washing with CHG (chlorahexidine  gluconate) soap before surgery.  CHG is an antiseptic cleaner which kills germs and bonds with the skin to continue killing germs even after washing. Please DO NOT use if you have an allergy to CHG or antibacterial soaps.  If your skin becomes reddened/irritated stop using the CHG and inform your nurse when you arrive at Short Stay. Do not shave (including legs and underarms) for at least 48 hours prior to the first CHG shower.  You may shave your face/neck. Please follow these instructions carefully:  1.  Shower with CHG Soap the night before surgery and the  morning of Surgery.  2.  If you choose to wash your hair, wash your hair first as usual with your  normal  shampoo.  3.  After you shampoo, rinse your hair and body thoroughly to remove the  shampoo.                           4.  Use CHG as you would any other liquid soap.  You can apply chg directly  to the skin and wash                       Gently with a scrungie or clean washcloth.  5.  Apply the CHG Soap to your body ONLY FROM THE NECK  DOWN.   Do not use on face/ open                           Wound or open sores. Avoid contact with eyes, ears mouth and genitals (private parts).                       Wash face,  Genitals (private parts) with your normal soap.             6.  Wash thoroughly, paying special attention to the area where your surgery  will be performed.  7.  Thoroughly rinse your body with warm water from the neck down.  8.  DO NOT shower/wash with your normal soap after using and rinsing off  the CHG Soap.                9.  Pat yourself dry with a clean towel.            10.  Wear clean pajamas.            11.  Place clean sheets on your bed the night of your first shower and do not  sleep with pets. Day of Surgery : Do not apply any lotions/deodorants the morning of surgery.  Please wear clean clothes to the hospital/surgery center.  FAILURE TO FOLLOW THESE INSTRUCTIONS MAY RESULT IN THE CANCELLATION OF YOUR  SURGERY PATIENT SIGNATURE_________________________________  NURSE SIGNATURE__________________________________  ________________________________________________________________________

## 2019-09-12 NOTE — Progress Notes (Signed)
Pt. Need orders for the upcomming surgery.PST appointment on 09/12/19. Pt. Refused blood transfusions.

## 2019-09-12 NOTE — Progress Notes (Signed)
COVID Vaccine Completed:yes Date COVID Vaccine completed: 1st. Dose: 09/09/19 COVID vaccine manufacturer: Pfizer    *[employer]   PCP - Dr. Alean Rinne. 09/10/19 Cardiologist -   Chest x-ray -  EKG - 06/18/19 EPIC Stress Test -  ECHO - 06/07/19 EPIC Cardiac Cath -   Sleep Study -  CPAP -   Fasting Blood Sugar - 90's Checks Blood Sugar __3___ times a day  Blood Thinner Instructions: Aspirin Instructions: Last Dose:  Anesthesia review:   Patient denies shortness of breath, fever, cough and chest pain at PAT appointment   Patient verbalized understanding of instructions that were given to them at the PAT appointment. Patient was also instructed that they will need to review over the PAT instructions again at home before surgery.

## 2019-09-16 ENCOUNTER — Other Ambulatory Visit (HOSPITAL_COMMUNITY)
Admission: RE | Admit: 2019-09-16 | Discharge: 2019-09-16 | Disposition: A | Discharge status: Home / Self Care | Payer: Medicaid Other | Source: Ambulatory Visit | Attending: Podiatry | Admitting: Podiatry

## 2019-09-16 ENCOUNTER — Encounter (HOSPITAL_COMMUNITY): Admission: RE | Admit: 2019-09-16 | Payer: Medicaid Other | Source: Ambulatory Visit

## 2019-09-16 DIAGNOSIS — Z20822 Contact with and (suspected) exposure to covid-19: Secondary | ICD-10-CM | POA: Diagnosis not present

## 2019-09-16 DIAGNOSIS — Z01812 Encounter for preprocedural laboratory examination: Secondary | ICD-10-CM | POA: Insufficient documentation

## 2019-09-16 LAB — SARS CORONAVIRUS 2 (TAT 6-24 HRS): SARS Coronavirus 2: NEGATIVE

## 2019-09-16 NOTE — Progress Notes (Signed)
Pt. Call from MD. Jorge Higgins has a pre-surgical appointment today,he was wondering if it was possible to do the lab at the MD. Office since there is a conflict with the appointments time,RN spoke with the patient and the office nurse,they agreed to do Jorge Higgins labs at their office.

## 2019-09-17 ENCOUNTER — Encounter (HOSPITAL_COMMUNITY): Payer: Self-pay | Admitting: Physician Assistant

## 2019-09-17 NOTE — Progress Notes (Signed)
Anesthesia Chart Review   Case: [id number] Date/Time: 09/19/19 1615   Procedure: IRRIGATION AND DEBRIDEMENT WOUND AND CLOSURE (Right ) - LOCAL   Anesthesia type: Monitor Anesthesia Care   Pre-op diagnosis: ULCER OF RIGHT FOOT   Location: [place] / [facility]   Surgeons: [name], DPM       DISCUSSION:44 y.o. former smoker (quit 06/24/13) with h/o PONV, HLD, GERD, CKD Stage III, DM II, HTN, ulcer of right foot scheduled for above procedure 06/19/2019 with Hardie Pulley, DPM.   Pt last seen by cardiologist 07/16/2019. Per OV note, "he is pending procedure with his podiatry. From a cardiovascular standpoint patient is acceptable risk for this procedure."  S/p right foot I&D 07/30/2019 with no anesthesia complications noted.   Pending H&P.  VS: Ht 5\' 9"  (1.753 m)   Wt 83.9 kg   BMI 27.32 kg/m   PROVIDERS: Coy Saunas, MD is PCP    LABS: Labs reviewed: Acceptable for surgery. and labs to be done at pcp office, pending (all labs ordered are listed, but only abnormal results are displayed)  Labs Reviewed - No data to display   IMAGES:   EKG: 06/18/2019 Rate 81 bpm NSR   CV: Echo 06/07/2019 1. This was a technically adequate study.  2. The left ventricular size is normal.  3.  Left ventricular wall thickness is normal.  4.  There is normal global left ventricular contractility 5. Overall left ventricular systolic function is low-normal with, an EF between 50-55%.  6.  The right ventricle is normal in size and function.  7. The left atrium is normal in size.  8. The right atrium is normal in size and function 9. There is mild aortic valve sclerosis.  10.  Trace amount of aortic regugitation 11. There is trace mitral regurgitation  12. Trace tricuspid regurgitation present 13. There is no pulmonic regurgitation present 14. The aortic root, ascending aorta and aortic arch appear normal.  15. There is no pericardial effusion  Past Medical History:  Diagnosis Date  .  Anemia of renal disease   . Chronic osteomyelitis (HCC)    s/p  toes amputation's  . CKD (chronic kidney disease), stage III   . Complication of anesthesia   . Depression   . Diabetic foot ulcer ([place])    right   . ED (erectile dysfunction)   . Encounter for management of wound VAC 2021   right foot  . GERD (gastroesophageal reflux disease)   . History of syncope    03/ 2021  near syncope episode (admission at [facility])  hyperkalemia and orthostatic hypotension  (07-24-2019  per pt was side effects of taking levaquin and has ckd)  . Hyperlipidemia   . Hypertension    followed by pcp   (07-24-2019 per pt never had a stress test)  . PONV (postoperative nausea and vomiting)   . Type 2 diabetes mellitus treated with insulin ([place])    followed by pcp   (07-24-2019  pt stated checks blood sugar TID,  fasting sugar-- 110    Past Surgical History:  Procedure Laterality Date  . AMPUTATION Left 09/19/2016   Procedure: PARTIAL 5th RAY AMPUTATION;  Surgeon: [name], DPM;  Location: [place];  Service: Podiatry;  Laterality: Left;  . AMPUTATION TOE Left 2019   complete amputation 5th toe  . FOOT SURGERY Right x2  in 04/2019;  x1 02/ 2021  @ [facility]   includes taking a bone out ,  insertion of spacer, debridement, wound vac placement  . IRRIGATION AND DEBRIDEMENT FOOT Right 07/30/2019   Procedure: IRRIGATION AND DEBRIDEMENT FOOT, ANTIBIOTIC SPACER EXCHANGE, WOUND CLOSURE;  Surgeon: [name], DPM;  Location: [place];  Service: Podiatry;  Laterality: Right;  Antibiotic cement, Vancomycin powder, pulse lavage  . TOE AMPUTATION Left 11/13/2015   4th toe    MEDICATIONS: . amLODipine (NORVASC) 5 MG tablet  . Ascorbic Acid (VITAMIN C) 500 MG CAPS  . clindamycin (CLEOCIN) 300 MG capsule  . Cyanocobalamin (B-12) 2500 MCG TABS  . ferrous sulfate 325 (65 FE) MG EC tablet  . glipiZIDE (GLUCOTROL) 5 MG tablet  . insulin glargine (LANTUS) 100 UNIT/ML  Solostar Pen  . metroNIDAZOLE (FLAGYL) 500 MG tablet  . Omega-3 1000 MG CAPS  . pantoprazole (PROTONIX) 40 MG tablet  . Probiotic Product [facility]) CAPS  . promethazine (PHENERGAN) 25 MG tablet  . vitamin B-12 (CYANOCOBALAMIN) 500 MCG tablet   No current facility-administered medications for this encounter.    Maia Plan [facility] Pre-Surgical Testing [phone] 09/18/19  10:32 AM

## 2019-09-17 NOTE — Progress Notes (Signed)
Lab results: Hemoglobin A1C: 9.0 Cr.: 2.40

## 2019-09-19 ENCOUNTER — Ambulatory Visit (HOSPITAL_COMMUNITY): Admission: RE | Admit: 2019-09-19 | Payer: Medicaid Other | Source: Home / Self Care | Admitting: Podiatry

## 2019-09-19 ENCOUNTER — Encounter (HOSPITAL_COMMUNITY): Admission: RE | Payer: Self-pay | Source: Home / Self Care

## 2019-09-19 SURGERY — IRRIGATION AND DEBRIDEMENT WOUND
Anesthesia: Monitor Anesthesia Care | Laterality: Right

## 2019-09-21 ENCOUNTER — Ambulatory Visit (HOSPITAL_COMMUNITY)
Admission: EM | Admit: 2019-09-21 | Discharge: 2019-09-21 | Disposition: A | Discharge status: Home / Self Care | Payer: Medicaid Other

## 2019-09-21 ENCOUNTER — Other Ambulatory Visit: Payer: Self-pay

## 2019-09-22 ENCOUNTER — Ambulatory Visit (INDEPENDENT_AMBULATORY_CARE_PROVIDER_SITE_OTHER): Payer: Medicaid Other | Admitting: Podiatry

## 2019-09-22 DIAGNOSIS — L97514 Non-pressure chronic ulcer of other part of right foot with necrosis of bone: Secondary | ICD-10-CM | POA: Diagnosis not present

## 2019-09-22 NOTE — Progress Notes (Signed)
  Subjective:  Patient ID: Jorge Higgins, male    DOB: 1976/02/23,  MRN: [id number]  Chief Complaint  Patient presents with  . Wound Check    F/U Rt wound check pt. states," seems okay." -pt denies redness/swelling -w/ clear drainage Tx: betadine and dry dressing and crutches -pt denies N/V/F/ch   . Diabetes    FBS": 130 x 1 day    44 y.o. male presents for wound care. Hx confirmed with patient.  Objective:  Physical Exam: Wound Location: right lateral Wound Measurement: 3.5x0.5x0.5 Wound Base: Granular/Healthy Peri-wound: Macerated Exudate: Scant/small amount Serosanguinous exudate wound without warmth, erythema, signs of acute infection Assessment:   1. 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  Ulcer right foot -Wound without acute infection today -Cleansed, gently debrided, and actisorb packed into the wound gently -Resume prisma and DSD every other day. -F/u in 1 week for recheck -If wound amenable, would plan for delayed closure, possibly in the office.  Return in about 1 week (around 09/29/2019).

## 2019-09-29 ENCOUNTER — Ambulatory Visit (INDEPENDENT_AMBULATORY_CARE_PROVIDER_SITE_OTHER): Payer: Medicaid Other | Admitting: Podiatry

## 2019-09-29 ENCOUNTER — Other Ambulatory Visit: Payer: Self-pay

## 2019-09-29 ENCOUNTER — Encounter: Payer: Medicaid Other | Admitting: Podiatry

## 2019-09-29 DIAGNOSIS — L97514 Non-pressure chronic ulcer of other part of right foot with necrosis of bone: Secondary | ICD-10-CM | POA: Diagnosis not present

## 2019-09-29 DIAGNOSIS — E13621 Other specified diabetes mellitus with foot ulcer: Secondary | ICD-10-CM

## 2019-09-29 NOTE — Progress Notes (Signed)
  Subjective:  Patient ID: Jorge Higgins, male    DOB: 19-Apr-1975,  MRN: [id number]  Chief Complaint  Patient presents with  . Wound Check    F/U Rt wound check pt. states," for me it looks good, like healing." -w/ draiange -pt denies N/V/F/Ch Tx: prisma and dressing change -pt denis N/F/?Ch    44 y.o. male presents for wound care. Hx confirmed with patient.  Objective:  Physical Exam: Wound Location: right lateral Wound Measurement: 3x0.5x0.4 Wound Base: Granular/Healthy Peri-wound: Macerated Exudate: Scant/small amount Serosanguinous exudate wound without warmth, erythema, signs of acute infection Assessment:   1. 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  Ulcer right foot -Wound cleansed, minimally debrided -Prisma packed into the wound, DSD applied -Awaiting medical improvement for possible surgery. -The edges are starting to become a little rolled which will make office closure dififcult.  No follow-ups on file.

## 2019-09-29 NOTE — Progress Notes (Signed)
  Subjective:  Patient ID: Claus Chuck Hint, male    DOB: 1975/09/16,  MRN: [id number]  No chief complaint on file.   DOS: 06/03/19 Procedure: Wound Debridement Right including Debridement of Calcaneus bone, exchange of antibiotic spacer, wound VAC application  44 y.o. male presents with the above complaint. History confirmed with patient.   Objective:  Physical Exam: tenderness at the surgical site, local edema noted and calf supple, nontender. Incision: healing with maceration, no exposed bone. Exposed abx spacer. 6.5x1 Assessment:   1. Chronic osteomyelitis involving right ankle and foot ([place])   2. 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  Post-operative State -Wound improving. Discussed plan for debridement and spacer exchange. Consent forms reviewed. -Dressed with Betadine WTD today  No follow-ups on file.

## 2019-10-03 ENCOUNTER — Telehealth: Payer: Self-pay | Admitting: *Deleted

## 2019-10-03 NOTE — Telephone Encounter (Signed)
Faxed discharge order request to [facility] wit 09/08/2019 clinical note.

## 2019-10-05 NOTE — Progress Notes (Signed)
  Subjective:  Patient ID: Jorge Higgins, male    DOB: 1976/03/26,  MRN: [id number]  Chief Complaint  Patient presents with  . Wound Check    Right foot wound check. Pt denies fever/chills/nausea/vomiting.    44 y.o. male presents for wound care. Here for VAC change. Objective:  Physical Exam: Wound Location: lateral foot Wound Measurement: 5x1.5 Wound Base: Granular/Healthy Peri-wound: Severely Macerated Exudate: Moderate amount Serosanguinous exudate wound without warmth, erythema, signs of acute infection and appears improved compared to last recheck  Assessment:   1. 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  Ulcer Lateral Foot s/p 5th ray and cuboid excision -Wound VAC reapplied  Procedure: Wound VAC Application Location: laterat foot right Wound Measurement: 5 cm x 1.5 cm x 0.5 cm  Technique: Black foam to wound base, followed by adherent dressing. Set to 125 mmHg with good seal noted. Disposition: Patient tolerated procedure well.

## 2019-10-05 NOTE — Progress Notes (Signed)
  Subjective:  Patient ID: Jorge Higgins, male    DOB: [birth year],  MRN: [id number]  Chief Complaint  Patient presents with  . Wound Check    Pt here for wound vac change. Pt denies fever/nausea/vomiting/chills. Pt states he has no concerns "My foot feels better".     43 y.o. male presents for wound care. Hx confirmed with patient.  Objective:  Physical Exam: Wound Location: right lateral foot Wound Measurement: 5x2.5 Wound Base: Granular/Healthy Peri-wound: Macerated Exudate: Scant/small amount Serosanguinous exudate wound without warmth, erythema, signs of acute infection  Assessment:   No diagnosis found. Plan:  Patient was evaluated and treated and all questions answered.  Ulcer Right foot -Wound gently debrided and VAC reapplied  Procedure: Wound VAC Application Location: right lateral foot Wound Measurement: 5 cm x 2.5 cm   Technique: Black foam to wound base, followed by adherent dressing. Set to 125 mmHg with good seal noted. Disposition: Patient tolerated procedure well.

## 2019-10-07 ENCOUNTER — Encounter: Payer: Medicaid Other | Admitting: Podiatry

## 2019-10-13 ENCOUNTER — Other Ambulatory Visit: Payer: Self-pay

## 2019-10-13 ENCOUNTER — Ambulatory Visit (INDEPENDENT_AMBULATORY_CARE_PROVIDER_SITE_OTHER): Payer: Medicaid Other | Admitting: Podiatry

## 2019-10-13 DIAGNOSIS — M21171 Varus deformity, not elsewhere classified, right ankle: Secondary | ICD-10-CM

## 2019-10-13 DIAGNOSIS — L97514 Non-pressure chronic ulcer of other part of right foot with necrosis of bone: Secondary | ICD-10-CM

## 2019-10-13 DIAGNOSIS — M86671 Other chronic osteomyelitis, right ankle and foot: Secondary | ICD-10-CM

## 2019-10-13 MED ORDER — SILVER SULFADIAZINE 1 % EX CREA: TOPICAL_CREAM | CUTANEOUS | 0 refills | Status: DC

## 2019-10-13 NOTE — Progress Notes (Signed)
  Subjective:  Patient ID: Cadel Chuck Hint, male    DOB: 1975/11/13,  MRN: [id number]  Chief Complaint  Patient presents with  . Foot Ulcer    F/U Rt wound checK pt. states," seems better and closing up." -pt dneies pian/N/V/F/Ch Tx: prisma dressing, crutches   . Diabetes    FBS: 110 A1C: 71   44 y.o. male presents for wound care. Hx confirmed with patient.  Objective:  Physical Exam: Wound Location: right lateral Wound Measurement: 3x0.5x1 cm Wound Base: Granular/Healthy Peri-wound: Macerated Exudate: Scant/small amount Serosanguinous exudate wound without warmth, erythema, signs of acute infection Green tinge to wound Assessment:   1. 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 Acquired varus deformity of right ankle   3. Chronic osteomyelitis involving right ankle and foot ([place])    Plan:  Patient was evaluated and treated and all questions answered.  Ulcer right foot -Wound cleansed, minimally debrided -Dressed with silvadene peripherally, betadine centrally. -Pt to alternate silvadene/betadine dressings daily -Awaiting medical clearance for possible surgery.  No follow-ups on file.

## 2019-10-13 NOTE — H&P (View-Only) (Signed)
  Subjective:  Patient ID: Jorge Higgins, male    DOB: 07-12-1975,  MRN: [id number]  Chief Complaint  Patient presents with  . Foot Ulcer    F/U Rt wound checK pt. states," seems better and closing up." -pt dneies pian/N/V/F/Ch Tx: prisma dressing, crutches   . Diabetes    FBS: 110 A1C: 40   44 y.o. male presents for wound care. Hx confirmed with patient.  Objective:  Physical Exam: Wound Location: right lateral Wound Measurement: 3x0.5x1 cm Wound Base: Granular/Healthy Peri-wound: Macerated Exudate: Scant/small amount Serosanguinous exudate wound without warmth, erythema, signs of acute infection Green tinge to wound Assessment:   1. 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 Acquired varus deformity of right ankle   3. Chronic osteomyelitis involving right ankle and foot ([place])    Plan:  Patient was evaluated and treated and all questions answered.  Ulcer right foot -Wound cleansed, minimally debrided -Dressed with silvadene peripherally, betadine centrally. -Pt to alternate silvadene/betadine dressings daily -Awaiting medical clearance for possible surgery.  No follow-ups on file.

## 2019-10-15 NOTE — Progress Notes (Signed)
  Subjective:  Patient ID: Jorge Higgins, male    DOB: 07-29-1975,  MRN: [id number]  No chief complaint on file.  DOS: 07/30/19 Procedure: :             1) Debridement of right foot wound             2) Removal and reinsertion of abx spacer             3) Partial wound closure             4) Wound VAC application  44 y.o. male presents with the above complaint. History confirmed with patient. Denies post-op issues or concerns.  Objective:  Physical Exam: tenderness at the surgical site, local edema noted and calf supple, nontender. Incision: healing well, no dehiscence, no significant erythema +ss drainage  Assessment:   1. 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  Post-operative State -Wound healing well. -Wound VAC reapplied.  Procedure: Wound VAC Application Location: right lateral foot Wound Measurement: 6 cm x 2 cm 0.2 cm  Technique: Black foam to wound base, followed by adherent dressing. Set to 125 mmHg with good seal noted. Disposition: Patient tolerated procedure well.   No follow-ups on file.

## 2019-10-20 DIAGNOSIS — N1832 Chronic kidney disease, stage 3b: Secondary | ICD-10-CM | POA: Insufficient documentation

## 2019-10-21 ENCOUNTER — Telehealth: Payer: Self-pay

## 2019-10-21 ENCOUNTER — Other Ambulatory Visit: Payer: Self-pay

## 2019-10-21 ENCOUNTER — Encounter: Payer: Medicaid Other | Admitting: Podiatry

## 2019-10-21 ENCOUNTER — Encounter (HOSPITAL_BASED_OUTPATIENT_CLINIC_OR_DEPARTMENT_OTHER): Payer: Self-pay | Admitting: Podiatry

## 2019-10-21 NOTE — Progress Notes (Signed)
Spoke w/ via phone for pre-op interview--- PT Lab needs dos----  [place]             Lab results------ current ekg dated 06-18-2019 in epic and chart;  Pt also had bmp done results in care everywhere 10-20-2019. COVID test ------ 10-22-2019 @ 1300 Arrive at ------- 1230 NPO after MN NO Solid Food.  Clear liquids from MN until--- 1130 then nothing by mouth Medications to take morning of surgery ----- Norvasc, Pepcid w/ sips of water Diabetic medication ----- do not take glipizide morning of surgery.  Do half dose of lantus insulin night before surgery. Patient Special Instructions ----- n/a Pre-Op special Istructions ----- pt H&P done by nephrologist, Dr Janine Limbo, dated 10-20-2019 received via fax from Dr March Rummage office is in chart.   Patient verbalized understanding of instructions that were given at this phone interview. Patient denies shortness of breath, chest pain, fever, cough a this phone interview.   Anesthesia Review:  Hx DM2 , CKD3, HTN, Anemia d/t renal disease.  Pt denies any cardiac s&s.  Chart to be reviewed by Konrad Felix PA.  PCP: Dr Alean Rinne (pcp lov with Dr Redmond Pulling 09-16-2019 in epic) Nephrologist:  Dr Janine Limbo (lov 10-20-2019 epic) Cardiologist :  no Chest x-ray : 09-23-2019 care everywhere EKG : 06-18-2019 epic Echo : 06-07-2019 epic Stress test:  Pt stated never had one Cardiac Cath :  no Activity level:  No sob with normal activities. Sob with going up stairs if had too, pt has diabetic ulcer wound right foot. Sleep Study/ CPAP : NO Fasting Blood Sugar :   110   / Checks Blood Sugar -- times a day:  TID Blood Thinner/ Instructions /Last Dose: NO ASA / Instructions/ Last Dose :  Pt stated given instructions from nephrologist to start take ASA 81mg  after surgery on 10-24-2019, he is not taking now.

## 2019-10-21 NOTE — Telephone Encounter (Signed)
DOS 10/24/2019  WOUND DEBRIDEMENT RT - 11044 WOUND CLOSURE RT - 13160  SPOKE TO MELISSA AT [facility] STATED NO PRECERT IS REQUIRED [place] [address].  CALL REF # [id number]

## 2019-10-22 ENCOUNTER — Other Ambulatory Visit (HOSPITAL_COMMUNITY)
Admission: RE | Admit: 2019-10-22 | Discharge: 2019-10-22 | Disposition: A | Discharge status: Home / Self Care | Payer: Medicaid Other | Source: Ambulatory Visit | Attending: Podiatry | Admitting: Podiatry

## 2019-10-22 DIAGNOSIS — Z01812 Encounter for preprocedural laboratory examination: Secondary | ICD-10-CM | POA: Diagnosis not present

## 2019-10-22 DIAGNOSIS — Z20822 Contact with and (suspected) exposure to covid-19: Secondary | ICD-10-CM | POA: Diagnosis not present

## 2019-10-22 LAB — SARS CORONAVIRUS 2 (TAT 6-24 HRS): SARS Coronavirus 2: NEGATIVE

## 2019-10-24 ENCOUNTER — Ambulatory Visit (HOSPITAL_COMMUNITY)
Admission: RE | Admit: 2019-10-24 | Discharge: 2019-10-24 | Disposition: A | Discharge status: Home / Self Care | Payer: Medicaid Other | Attending: Podiatry | Admitting: Podiatry

## 2019-10-24 ENCOUNTER — Ambulatory Visit (HOSPITAL_BASED_OUTPATIENT_CLINIC_OR_DEPARTMENT_OTHER): Payer: Medicaid Other | Admitting: Anesthesiology

## 2019-10-24 ENCOUNTER — Encounter (HOSPITAL_BASED_OUTPATIENT_CLINIC_OR_DEPARTMENT_OTHER)
Admission: RE | Disposition: A | Discharge status: Home / Self Care | Payer: Self-pay | Source: Home / Self Care | Attending: Podiatry

## 2019-10-24 ENCOUNTER — Other Ambulatory Visit: Payer: Self-pay

## 2019-10-24 ENCOUNTER — Encounter (HOSPITAL_BASED_OUTPATIENT_CLINIC_OR_DEPARTMENT_OTHER): Payer: Self-pay | Admitting: Podiatry

## 2019-10-24 DIAGNOSIS — M86671 Other chronic osteomyelitis, right ankle and foot: Secondary | ICD-10-CM | POA: Insufficient documentation

## 2019-10-24 DIAGNOSIS — E1169 Type 2 diabetes mellitus with other specified complication: Secondary | ICD-10-CM | POA: Insufficient documentation

## 2019-10-24 DIAGNOSIS — L97512 Non-pressure chronic ulcer of other part of right foot with fat layer exposed: Secondary | ICD-10-CM

## 2019-10-24 DIAGNOSIS — N289 Disorder of kidney and ureter, unspecified: Secondary | ICD-10-CM | POA: Diagnosis not present

## 2019-10-24 DIAGNOSIS — I10 Essential (primary) hypertension: Secondary | ICD-10-CM | POA: Insufficient documentation

## 2019-10-24 DIAGNOSIS — E11621 Type 2 diabetes mellitus with foot ulcer: Secondary | ICD-10-CM | POA: Insufficient documentation

## 2019-10-24 DIAGNOSIS — L97519 Non-pressure chronic ulcer of other part of right foot with unspecified severity: Secondary | ICD-10-CM | POA: Insufficient documentation

## 2019-10-24 DIAGNOSIS — Z87891 Personal history of nicotine dependence: Secondary | ICD-10-CM | POA: Insufficient documentation

## 2019-10-24 HISTORY — PX: INCISION AND DRAINAGE OF WOUND: SHX1803

## 2019-10-24 HISTORY — DX: Unspecified background retinopathy: H35.00

## 2019-10-24 HISTORY — DX: Type 2 diabetes mellitus with diabetic neuropathy, unspecified: E11.40

## 2019-10-24 LAB — POCT I-STAT, CHEM 8
BUN: 27 mg/dL — ABNORMAL HIGH (ref 6–20)
Calcium, Ion: 1.19 mmol/L (ref 1.15–1.40)
Chloride: 104 mmol/L (ref 98–111)
Creatinine, Ser: 2.9 mg/dL — ABNORMAL HIGH (ref 0.61–1.24)
Glucose, Bld: 207 mg/dL — ABNORMAL HIGH (ref 70–99)
HCT: 36 % — ABNORMAL LOW (ref 39.0–52.0)
Hemoglobin: 12.2 g/dL — ABNORMAL LOW (ref 13.0–17.0)
Potassium: 3.8 mmol/L (ref 3.5–5.1)
Sodium: 141 mmol/L (ref 135–145)
TCO2: 21 mmol/L — ABNORMAL LOW (ref 22–32)

## 2019-10-24 LAB — GLUCOSE, CAPILLARY: Glucose-Capillary: 117 mg/dL — ABNORMAL HIGH (ref 70–99)

## 2019-10-24 SURGERY — IRRIGATION AND DEBRIDEMENT WOUND
Anesthesia: Monitor Anesthesia Care | Site: Foot | Laterality: Right

## 2019-10-24 MED ORDER — OXYCODONE HCL 5 MG PO TABS: 5.0000 mg | ORAL_TABLET | Freq: Once | ORAL | Status: DC | PRN

## 2019-10-24 MED ORDER — OXYCODONE HCL 5 MG/5ML PO SOLN: 5.0000 mg | Freq: Once | ORAL | Status: DC | PRN

## 2019-10-24 MED ORDER — VANCOMYCIN HCL 1 G IV SOLR
INTRAVENOUS | Status: DC | PRN
  Administered 2019-10-24: 1000 mg

## 2019-10-24 MED ORDER — ONDANSETRON HCL 4 MG/2ML IJ SOLN: 4.0000 mg | Freq: Once | INTRAMUSCULAR | Status: DC | PRN

## 2019-10-24 MED ORDER — MIDAZOLAM HCL 2 MG/2ML IJ SOLN
INTRAMUSCULAR | Status: DC | PRN
  Administered 2019-10-24: 2 mg via INTRAVENOUS

## 2019-10-24 MED ORDER — HYDROCODONE-ACETAMINOPHEN 5-325 MG PO TABS: 1.0000 | ORAL_TABLET | ORAL | 0 refills | Status: DC | PRN

## 2019-10-24 MED ORDER — ACETAMINOPHEN 325 MG PO TABS: 325.0000 mg | ORAL_TABLET | ORAL | Status: DC | PRN

## 2019-10-24 MED ORDER — CLINDAMYCIN HCL 300 MG PO CAPS
300.0000 mg | ORAL_CAPSULE | Freq: Two times a day (BID) | ORAL | 0 refills | Status: AC
Start: 2019-10-24 — End: 2019-10-31

## 2019-10-24 MED ORDER — MEPERIDINE HCL 25 MG/ML IJ SOLN: 6.2500 mg | INTRAMUSCULAR | Status: DC | PRN

## 2019-10-24 MED ORDER — PROPOFOL 500 MG/50ML IV EMUL
INTRAVENOUS | Status: AC
  Filled 2019-10-24: qty 50

## 2019-10-24 MED ORDER — SODIUM CHLORIDE 0.9 % IV SOLN: INTRAVENOUS | Status: DC

## 2019-10-24 MED ORDER — ONDANSETRON HCL 4 MG PO TABS: 4.0000 mg | ORAL_TABLET | Freq: Three times a day (TID) | ORAL | 0 refills | Status: DC | PRN

## 2019-10-24 MED ORDER — MIDAZOLAM HCL 2 MG/2ML IJ SOLN
INTRAMUSCULAR | Status: AC
  Filled 2019-10-24: qty 2

## 2019-10-24 MED ORDER — CEFAZOLIN SODIUM-DEXTROSE 2-4 GM/100ML-% IV SOLN
INTRAVENOUS | Status: AC
  Filled 2019-10-24: qty 100

## 2019-10-24 MED ORDER — ONDANSETRON HCL 4 MG/2ML IJ SOLN: 4.0000 mg | Freq: Four times a day (QID) | INTRAMUSCULAR | Status: DC | PRN

## 2019-10-24 MED ORDER — FENTANYL CITRATE (PF) 100 MCG/2ML IJ SOLN: 25.0000 ug | INTRAMUSCULAR | Status: DC | PRN

## 2019-10-24 MED ORDER — ACETAMINOPHEN 160 MG/5ML PO SOLN: 325.0000 mg | ORAL | Status: DC | PRN

## 2019-10-24 MED ORDER — ONDANSETRON HCL 4 MG/2ML IJ SOLN
INTRAMUSCULAR | Status: DC | PRN
  Administered 2019-10-24: 4 mg via INTRAVENOUS

## 2019-10-24 MED ORDER — CEFAZOLIN SODIUM-DEXTROSE 2-4 GM/100ML-% IV SOLN
2.0000 g | INTRAVENOUS | Status: AC
  Administered 2019-10-24: 2 g via INTRAVENOUS

## 2019-10-24 MED ORDER — FENTANYL CITRATE (PF) 100 MCG/2ML IJ SOLN
INTRAMUSCULAR | Status: AC
  Filled 2019-10-24: qty 2

## 2019-10-24 MED ORDER — FENTANYL CITRATE (PF) 100 MCG/2ML IJ SOLN
INTRAMUSCULAR | Status: DC | PRN
  Administered 2019-10-24: 100 ug via INTRAVENOUS

## 2019-10-24 MED ORDER — BUPIVACAINE HCL (PF) 0.5 % IJ SOLN
INTRAMUSCULAR | Status: DC | PRN
  Administered 2019-10-24: 10 mL

## 2019-10-24 SURGICAL SUPPLY — 60 items
APL PRP STRL LF DISP 70% ISPRP (MISCELLANEOUS)
BLADE SURG 15 STRL LF DISP TIS (BLADE) ×1 IMPLANT
BLADE SURG 15 STRL SS (BLADE) ×3
BNDG CMPR 9X4 STRL LF SNTH (GAUZE/BANDAGES/DRESSINGS)
BNDG ELASTIC 3X5.8 VLCR STR LF (GAUZE/BANDAGES/DRESSINGS) ×3 IMPLANT
BNDG ELASTIC 4X5.8 VLCR STR LF (GAUZE/BANDAGES/DRESSINGS) ×3 IMPLANT
BNDG ESMARK 4X9 LF (GAUZE/BANDAGES/DRESSINGS) IMPLANT
BNDG GAUZE ELAST 4 BULKY (GAUZE/BANDAGES/DRESSINGS) ×3 IMPLANT
CHLORAPREP W/TINT 26 (MISCELLANEOUS) IMPLANT
CNTNR URN SCR LID CUP LEK RST (MISCELLANEOUS) IMPLANT
CONT SPEC 4OZ STRL OR WHT (MISCELLANEOUS)
COVER BACK TABLE 60X90IN (DRAPES) ×3 IMPLANT
COVER WAND RF STERILE (DRAPES) ×3 IMPLANT
CUFF TOURN SGL QUICK 18X4 (TOURNIQUET CUFF) IMPLANT
CUFF TOURN SGL QUICK 24 (TOURNIQUET CUFF)
CUFF TRNQT CYL 24X4X16.5-23 (TOURNIQUET CUFF) IMPLANT
DRAPE 3/4 80X56 (DRAPES) ×3 IMPLANT
DRAPE EXTREMITY T 121X128X90 (DISPOSABLE) ×3 IMPLANT
DRAPE SHEET LG 3/4 BI-LAMINATE (DRAPES) ×3 IMPLANT
DRAPE U-SHAPE 47X51 STRL (DRAPES) ×3 IMPLANT
ELECT REM PT RETURN 9FT ADLT (ELECTROSURGICAL) ×3
ELECTRODE REM PT RTRN 9FT ADLT (ELECTROSURGICAL) ×1 IMPLANT
GAUZE SPONGE 4X4 12PLY STRL (GAUZE/BANDAGES/DRESSINGS) ×3 IMPLANT
GAUZE SPONGE 4X4 12PLY STRL LF (GAUZE/BANDAGES/DRESSINGS) ×3 IMPLANT
GAUZE XEROFORM 1X8 LF (GAUZE/BANDAGES/DRESSINGS) ×6 IMPLANT
GLOVE BIO SURGEON STRL SZ7.5 (GLOVE) ×3 IMPLANT
GLOVE BIOGEL PI IND STRL 8 (GLOVE) ×1 IMPLANT
GLOVE BIOGEL PI INDICATOR 8 (GLOVE) ×2
GOWN STRL REUS W/ TWL XL LVL3 (GOWN DISPOSABLE) ×1 IMPLANT
GOWN STRL REUS W/TWL XL LVL3 (GOWN DISPOSABLE) ×3
MANIFOLD NEPTUNE II (INSTRUMENTS) IMPLANT
MARKER SKIN DUAL TIP RULER LAB (MISCELLANEOUS) ×3 IMPLANT
NDL SAFETY ECLIPSE 18X1.5 (NEEDLE) ×1 IMPLANT
NEEDLE HYPO 18GX1.5 SHARP (NEEDLE) ×3
NEEDLE HYPO 25X1 1.5 SAFETY (NEEDLE) ×3 IMPLANT
NS IRRIG 1000ML POUR BTL (IV SOLUTION) IMPLANT
PACK BASIN DAY SURGERY FS (CUSTOM PROCEDURE TRAY) ×3 IMPLANT
PADDING CAST ABS 4INX4YD NS (CAST SUPPLIES)
PADDING CAST ABS COTTON 4X4 ST (CAST SUPPLIES) IMPLANT
PENCIL SMOKE EVAC W/HOLSTER (ELECTROSURGICAL) ×3 IMPLANT
SET IRRIG Y TYPE TUR BLADDER L (SET/KITS/TRAYS/PACK) ×3 IMPLANT
SPONGE LAP 4X18 RFD (DISPOSABLE) ×3 IMPLANT
STAPLER VISISTAT 35W (STAPLE) ×3 IMPLANT
STOCKINETTE 6  STRL (DRAPES) ×2
STOCKINETTE 6 STRL (DRAPES) ×1 IMPLANT
SUCTION FRAZIER HANDLE 10FR (MISCELLANEOUS)
SUCTION TUBE FRAZIER 10FR DISP (MISCELLANEOUS) IMPLANT
SUT ETHILON 3 0 PS 1 (SUTURE) ×6 IMPLANT
SUT ETHILON 4 0 PS 2 18 (SUTURE) IMPLANT
SUT MNCRL AB 3-0 PS2 18 (SUTURE) IMPLANT
SUT MNCRL AB 4-0 PS2 18 (SUTURE) IMPLANT
SUT VIC AB 2-0 SH 27 (SUTURE)
SUT VIC AB 2-0 SH 27XBRD (SUTURE) IMPLANT
SYR 10ML LL (SYRINGE) ×3 IMPLANT
SYR BULB EAR ULCER 3OZ GRN STR (SYRINGE) ×3 IMPLANT
SYR CONTROL 10ML LL (SYRINGE) ×3 IMPLANT
TRAY DSU PREP LF (CUSTOM PROCEDURE TRAY) ×3 IMPLANT
TUBE IRRIGATION SET MISONIX (TUBING) ×3 IMPLANT
UNDERPAD 30X36 HEAVY ABSORB (UNDERPADS AND DIAPERS) ×3 IMPLANT
YANKAUER SUCT BULB TIP NO VENT (SUCTIONS) ×3 IMPLANT

## 2019-10-24 NOTE — Op Note (Signed)
Patient Name: Jorge Higgins DOB: May 17, 1975  MRN: [id number]   Date of Service: 10/24/19   Surgeon: Dr. Hardie Pulley, DPM Assistants: None Pre-operative Diagnosis: Ulcer right foot Post-operative Diagnosis: same Procedures:             1) Debridement and irrigation of right foot ulcer  2) Wound closure right foot Pathology/Specimens: * No specimens in log * Anesthesia: MAC/local Hemostasis: Anatomic Estimated Blood Loss: 10 ml Materials: None Medications: 1g Vancomycin powder, topical Complications: None  Indications for Procedure:  This is a 44 y.o. male with a chronic RLE wound that is now amenable for closure. He presents for such. All risks, benefits, and alternatives of surgery discussed. No guarantees given   Procedure in Detail: Patient was identified in pre-operative holding area. Formal consent was signed and the right lower extremity was marked. Patient was brought back to the operating room and placed on the operating room table in the supine position. Anesthesia was induced.   The extremity was prepped and draped in the usual sterile fashion. Timeout was taken to confirm patient name, laterality, and procedure prior to incision. Attention was then directed to the right foot where a wound measuring 4.0x5x1 was encountered.  The wound was sharply excisionally debrided with a 15 blade, followed by a misonix ultrasonic debrider. Debridement was performed to bleeding, viable wound base. The wound was debrided to the level of the muscle tissue. Following debridement the wound measured 5x1x1.5.  The skin edges were then freshened, undermined, and the wound was then primarily closed with 3-0 nylon matress suture and skin staples.   The foot was then dressed with xeroform 4x4, kerlix, and ACE bandage. Patient tolerated the procedure well.   Disposition: Following a period of post-operative monitoring, patient will be transferred back home.

## 2019-10-24 NOTE — Discharge Instructions (Signed)
  After Surgery Instructions  1) If you are recuperating from surgery anywhere other than home, please be sure to leave [place] the number where you can be reached.  2) Go directly home and rest.  3) Keep the operated foot(feet) elevated six inches above the hip when sitting or lying down. This will help control swelling and pain.  4) Support the elevated foot and leg with pillows. DO NOT PLACE PILLOWS UNDER THE KNEE.  5) DO NOT REMOVE or get your bandages WET, unless you were given different instructions by your doctor to do so. This increases the risk of infection.  6) Wear your surgical shoe or surgical boot at all times when you are up on your feet.  7) A limited amount of pain and swelling may occur. The skin may take on a bruised appearance. DO NOT BE ALARMED, THIS IS NORMAL.  8) For slight pain and swelling, apply an ice pack directly over the bandages for 15 minutes only out of each hour of the day. Continue until seen in the office for your first post op visit. DO NOT APPLY ANY FORM OF HEAT TO THE AREA.  9) Have prescriptions filled immediately and take as directed.  10) Drink lots of liquids, water and juice to stay hydrated.  11) CALL IMMEDIATELY IF:  *Bleeding continues until the following day of surgery  *Pain increases and/or does not respond to medication  *Bandages or cast appears to tight  *If your bandage gets wet  *Trip, fall or stump your surgical foot  *If your temperature goes above 101  *If you have ANY questions at all  12) You are expected to be non-weightbearing after your surgery.   If you need to reach the nurse for any reason, please call: [place]: [phone] [place]: [phone] [place]: [phone]      Post Anesthesia Home Care Instructions  Activity: Get plenty of rest for the remainder of the day. A responsible individual must stay with you for 24 hours following the procedure.  For the next 24 hours, DO  NOT: -Drive a car -Paediatric nurse -Drink alcoholic beverages -Take any medication unless instructed by your physician -Make any legal decisions or sign important papers.  Meals: Start with liquid foods such as gelatin or soup. Progress to regular foods as tolerated. Avoid greasy, spicy, heavy foods. If nausea and/or vomiting occur, drink only clear liquids until the nausea and/or vomiting subsides. Call your physician if vomiting continues.  Special Instructions/Symptoms: Your throat may feel dry or sore from the anesthesia or the breathing tube placed in your throat during surgery. If this causes discomfort, gargle with warm salt water. The discomfort should disappear within 24 hours.

## 2019-10-24 NOTE — Anesthesia Preprocedure Evaluation (Signed)
Anesthesia Evaluation  Patient identified by MRN, date of birth, ID band Patient awake    Reviewed: Allergy & Precautions, H&P , NPO status , Patient's Chart, lab work & pertinent test results  History of Anesthesia Complications (+) PONV and history of anesthetic complications  Airway Mallampati: II   Neck ROM: full    Dental   Pulmonary former smoker,    breath sounds clear to auscultation       Cardiovascular hypertension,  Rhythm:regular Rate:Normal     Neuro/Psych PSYCHIATRIC DISORDERS Depression    GI/Hepatic GERD  ,  Endo/Other  diabetes, Type 2  Renal/GU Renal InsufficiencyRenal disease     Musculoskeletal   Abdominal   Peds  Hematology  (+) Blood dyscrasia, anemia ,   Anesthesia Other Findings   Reproductive/Obstetrics                             Anesthesia Physical  Anesthesia Plan  ASA: III  Anesthesia Plan: MAC   Post-op Pain Management:    Induction: Intravenous  PONV Risk Score and Plan: 2 and Ondansetron, Propofol infusion, Midazolam and Treatment may vary due to age or medical condition  Airway Management Planned: Simple Face Mask  Additional Equipment:   Intra-op Plan:   Post-operative Plan:   Informed Consent: I have reviewed the patients History and Physical, chart, labs and discussed the procedure including the risks, benefits and alternatives for the proposed anesthesia with the patient or authorized representative who has indicated his/her understanding and acceptance.       Plan Discussed with: CRNA, Anesthesiologist and Surgeon  Anesthesia Plan Comments:         Anesthesia Quick Evaluation  

## 2019-10-24 NOTE — Anesthesia Procedure Notes (Signed)
Procedure Name: MAC Date/Time: 10/24/2019 2:38 PM Performed by: Niel Hummer, CRNA Pre-anesthesia Checklist: Patient identified, Emergency Drugs available, Suction available and Patient being monitored Oxygen Delivery Method: Simple face mask

## 2019-10-24 NOTE — Transfer of Care (Signed)
Immediate Anesthesia Transfer of Care Note  Patient: Jorge Higgins  Procedure(s) Performed: IRRIGATION AND DEBRIDEMENT WOUND AND CLOSURE (Right Foot)  Patient Location: PACU  Anesthesia Type:MAC  Level of Consciousness: awake, alert  and oriented  Airway & Oxygen Therapy: Patient Spontanous Breathing and Patient connected to face mask oxygen  Post-op Assessment: Report given to RN, Post -op Vital signs reviewed and stable and Patient moving all extremities X 4  Post vital signs: Reviewed and stable  Last Vitals:  Vitals Value Taken Time  BP 133/97 10/24/19 1523  Temp    Pulse 80 10/24/19 1525  Resp 14 10/24/19 1525  SpO2 96 % 10/24/19 1525  Vitals shown include unvalidated device data.  Last Pain:  Vitals:   10/24/19 1321  TempSrc: Oral  PainSc: 0-No pain         Complications: No complications documented.

## 2019-10-24 NOTE — Interval H&P Note (Signed)
History and Physical Interval Note:  10/24/2019 2:17 PM  Jorge Higgins  has presented today for surgery, with the diagnosis of ULCER OF RIGHT FOOT.  The various methods of treatment have been discussed with the patient and family. After consideration of risks, benefits and other options for treatment, the patient has consented to  Procedure(s) with comments: [place] (Right) - LOCAL as a surgical intervention.  The patient's history has been reviewed, patient examined, no change in status, stable for surgery.  I have reviewed the patient's chart and labs.  Questions were answered to the patient's satisfaction.    He reports that he ate this AM a sandwich at [employer] and iced tea. He states he finished eating by 8:40. We did discuss the risks associated with this up to and including aspiration, pnemonia, and death. We will wait to start the procedure until we are at 6 hours from this time.  [name]

## 2019-10-27 ENCOUNTER — Encounter (HOSPITAL_BASED_OUTPATIENT_CLINIC_OR_DEPARTMENT_OTHER): Payer: Self-pay | Admitting: Podiatry

## 2019-10-27 ENCOUNTER — Ambulatory Visit (INDEPENDENT_AMBULATORY_CARE_PROVIDER_SITE_OTHER): Payer: Medicaid Other | Admitting: Podiatry

## 2019-10-27 ENCOUNTER — Other Ambulatory Visit: Payer: Self-pay

## 2019-10-27 DIAGNOSIS — Z9889 Other specified postprocedural states: Secondary | ICD-10-CM

## 2019-10-27 DIAGNOSIS — E13621 Other specified diabetes mellitus with foot ulcer: Secondary | ICD-10-CM

## 2019-10-27 DIAGNOSIS — L97514 Non-pressure chronic ulcer of other part of right foot with necrosis of bone: Secondary | ICD-10-CM

## 2019-10-27 NOTE — Progress Notes (Signed)
  Subjective:  Patient ID: Jorge Higgins, male    DOB: 07-Sep-1975,  MRN: [id number]  Chief Complaint  Patient presents with  . Routine Post Op    POV #1 Pt. states,' feels alright, felt a little senstion, first 3 days with soreness but better today." Tx: betadine paste and dressing and crutches -no N/V?F/Ch   . Diabetes    FBS: 130    DOS: 10/24/19 Procedure: Right foot wound closure   44 y.o. male presents with the above complaint. History confirmed with patient.   Objective:  Physical Exam: no tenderness at the surgical site, no edema noted and calf supple, nontender. Incision: healing well, no significant erythema, slight clear drainage present, skin edges loosely approximated. Varus deformity present, chronic  Assessment:   1. 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 Post-operative state     Plan:  Patient was evaluated and treated and all questions answered.  Post-operative State -Central aspect of the wound packed with iodoform packing. Pt to do so daily. Given supplies to do so. -Dressed with DSD -Awaiting boot/brace from [employer]. -F/u with Dr. Sherryle Lis next week for wound check, then the following week with me.  Return in about 1 week (around 11/03/2019) for Post-Op (with XRs).

## 2019-10-28 NOTE — Anesthesia Postprocedure Evaluation (Signed)
Anesthesia Post Note  Patient: Jorge Higgins  Procedure(s) Performed: IRRIGATION AND DEBRIDEMENT WOUND AND CLOSURE (Right Foot)     Patient location during evaluation: PACU Anesthesia Type: MAC Level of consciousness: awake and alert Pain management: pain level controlled Vital Signs Assessment: post-procedure vital signs reviewed and stable Respiratory status: spontaneous breathing, nonlabored ventilation, respiratory function stable and patient connected to nasal cannula oxygen Cardiovascular status: stable and blood pressure returned to baseline Postop Assessment: no apparent nausea or vomiting Anesthetic complications: no   No complications documented.  Last Vitals:  Vitals:   10/24/19 1555 10/24/19 1630  BP: (!) 130/95 (!) 151/96  Pulse: 77 79  Resp: 15 16  Temp:  36.6 C  SpO2: 97% 99%    Last Pain:  Vitals:   10/27/19 0946  TempSrc:   PainSc: 2                  Jadasia Haws S

## 2019-11-03 ENCOUNTER — Ambulatory Visit: Payer: Medicaid Other | Admitting: Podiatry

## 2019-11-05 ENCOUNTER — Encounter: Payer: Self-pay | Admitting: Sports Medicine

## 2019-11-05 ENCOUNTER — Ambulatory Visit (INDEPENDENT_AMBULATORY_CARE_PROVIDER_SITE_OTHER): Payer: Medicaid Other | Admitting: Sports Medicine

## 2019-11-05 ENCOUNTER — Other Ambulatory Visit: Payer: Self-pay | Admitting: Sports Medicine

## 2019-11-05 ENCOUNTER — Ambulatory Visit (INDEPENDENT_AMBULATORY_CARE_PROVIDER_SITE_OTHER): Payer: Medicaid Other

## 2019-11-05 ENCOUNTER — Other Ambulatory Visit: Payer: Self-pay

## 2019-11-05 DIAGNOSIS — Z9889 Other specified postprocedural states: Secondary | ICD-10-CM

## 2019-11-05 DIAGNOSIS — M21171 Varus deformity, not elsewhere classified, right ankle: Secondary | ICD-10-CM

## 2019-11-05 DIAGNOSIS — L97514 Non-pressure chronic ulcer of other part of right foot with necrosis of bone: Secondary | ICD-10-CM | POA: Diagnosis not present

## 2019-11-05 DIAGNOSIS — E13621 Other specified diabetes mellitus with foot ulcer: Secondary | ICD-10-CM | POA: Diagnosis not present

## 2019-11-05 DIAGNOSIS — M86671 Other chronic osteomyelitis, right ankle and foot: Secondary | ICD-10-CM

## 2019-11-05 NOTE — Progress Notes (Signed)
Subjective: Jorge Higgins is a 44 y.o. male patient seen in office for evaluation of ulceration of the right lateral foot, patient is s/p right foot wound closure performed on 10/24/19 by Dr. March Rummage.  Patient has a history of diabetes and a blood glucose level  today of 160 mg/dl.   Patient is changing the dressing using packing but feels like its gapping with some drainage. Denies nausea/fever/vomiting/chills/night sweats/shortness of breath/pain. Patient has no other pedal complaints at this time.  Patient Active Problem List   Diagnosis Date Noted  . Ulcerated, foot, right, with fat layer exposed ([place])   . Stage 3b chronic kidney disease 10/20/2019  . 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 Wound dehiscence   . Orthostatic hypotension 07/16/2019  . Dysautonomia ([place]) 07/16/2019  . Mixed diabetic hyperlipidemia associated with type 2 diabetes mellitus ([place]) 03/31/2019  . Open wound of left foot 03/31/2019  . ED (erectile dysfunction) 10/06/2016  . Osteomyelitis ([place]) 09/19/2016  . Leukocytosis 09/19/2016  . Chronic osteomyelitis of left foot ([place]) 08/17/2016  . Osteomyelitis of left foot ([place]) 08/17/2016  . Hypertension   . Diabetes mellitus without complication ([place])   . Toe amputation status 12/15/2015  . Cellulitis 10/14/2015  . Diabetic ulcer of left foot associated with type 2 diabetes mellitus ([place]) 10/06/2015  . Refused pneumococcal vaccine 07/10/2013   Current Outpatient Medications on File Prior to Visit  Medication Sig Dispense Refill  . insulin lispro (HUMALOG) 100 UNIT/ML KwikPen Inject into the skin.    Marland Kitchen albuterol (VENTOLIN HFA) 108 (90 Base) MCG/ACT inhaler Inhale into the lungs every 4 (four) hours as needed.     Marland Kitchen amLODipine (NORVASC) 10 MG tablet Take 10 mg by mouth daily.    Marland Kitchen amLODipine (NORVASC) 5 MG tablet Take 1 tablet (5 mg total) by mouth daily. (Patient taking differently: Take 5 mg by mouth daily. ) 30 tablet 2  . Ascorbic Acid  (VITAMIN C) 500 MG CAPS Take by mouth daily.    Marland Kitchen aspirin 81 MG EC tablet Take by mouth.    . cefpodoxime (VANTIN) 200 MG tablet Take 200 mg by mouth daily.    . Cyanocobalamin (B-12) 2500 MCG TABS Take by mouth daily.    . Ergocalciferol 50 MCG (2000 UT) TABS Take by mouth.    . famotidine (PEPCID) 20 MG tablet Take 20 mg by mouth 2 (two) times daily.    . ferrous sulfate 325 (65 FE) MG EC tablet Take 325 mg by mouth daily.    Marland Kitchen glipiZIDE (GLUCOTROL) 5 MG tablet Take 5 mg by mouth 2 (two) times daily before a meal.     . HYDROcodone-acetaminophen (NORCO/VICODIN) 5-325 MG tablet Take 1 tablet by mouth every 4 (four) hours as needed for moderate pain. 20 tablet 0  . HYDROcodone-homatropine (HYCODAN) 5-1.5 MG/5ML syrup Take by mouth. (Patient not taking: Reported on 10/21/2019)    . insulin glargine (LANTUS) 100 UNIT/ML Solostar Pen Inject 5 Units into the skin at bedtime.     Marland Kitchen losartan (COZAAR) 25 MG tablet Take 25 mg by mouth daily.    . Omega-3 1000 MG CAPS Take by mouth daily.    . ondansetron (ZOFRAN) 4 MG tablet Take 1 tablet (4 mg total) by mouth every 8 (eight) hours as needed for nausea or vomiting. 20 tablet 0  . ondansetron (ZOFRAN-ODT) 4 MG disintegrating tablet Take 4 mg by mouth every 8 (eight) hours as needed.    . predniSONE (DELTASONE) 20  MG tablet Take 40 mg by mouth daily.    . promethazine (PHENERGAN) 25 MG tablet Take 1 tablet (25 mg total) by mouth every 8 (eight) hours as needed for nausea or vomiting. 20 tablet 0  . silver sulfADIAZINE (SILVADENE) 1 % cream Apply pea-sized amount to wound every other day. 50 g 0   No current facility-administered medications on file prior to visit.   Allergies  Allergen Reactions  . Azithromycin Nausea And Vomiting, Shortness Of Breath and Other (See Comments)  . Levaquin [Levofloxacin]     Dizziness, nausea, weakness, heart races  . Lipitor [Atorvastatin] Other (See Comments)    Dizziness, heart races, weakness  . Metoprolol Other  (See Comments)    Elevates Potassium too much  . Hydromorphone Nausea And Vomiting  . Nickel Rash    Recent Results (from the past 2160 hour(s))  SARS CORONAVIRUS 2 (TAT 6-24 HRS) Nasopharyngeal Nasopharyngeal Swab     Status: None   Collection Time: 09/16/19  8:10 AM   Specimen: Nasopharyngeal Swab  Result Value Ref Range   SARS Coronavirus 2 NEGATIVE NEGATIVE    Comment: (NOTE) SARS-CoV-2 target nucleic acids are NOT DETECTED.  The SARS-CoV-2 RNA is generally detectable in upper and lower respiratory specimens during the acute phase of infection. Negative results do not preclude SARS-CoV-2 infection, do not rule out co-infections with other pathogens, and should not be used as the sole basis for treatment or other patient management decisions. Negative results must be combined with clinical observations, patient history, and epidemiological information. The expected result is Negative.  Fact Sheet for Patients: SugarRoll.be  Fact Sheet for Healthcare Providers: https://www.woods-mathews.com/  This test is not yet approved or cleared by the [place] FDA and  has been authorized for detection and/or diagnosis of SARS-CoV-2 by FDA under an Emergency Use Authorization (EUA). This EUA will remain  in effect (meaning this test can be used) for the duration of the COVID-19 declaration under Se ction 564(b)(1) of the Act, 21 U.S.C. section 360bbb-3(b)(1), unless the authorization is terminated or revoked sooner.  Performed at [facility], [place] [address] [place], [place] [address]   SARS CORONAVIRUS 2 (TAT 6-24 HRS) Nasopharyngeal Nasopharyngeal Swab     Status: None   Collection Time: 10/22/19  1:27 PM   Specimen: Nasopharyngeal Swab  Result Value Ref Range   SARS Coronavirus 2 NEGATIVE NEGATIVE    Comment: (NOTE) SARS-CoV-2 target nucleic acids are NOT DETECTED.  The SARS-CoV-2 RNA is generally detectable in upper and  lower respiratory specimens during the acute phase of infection. Negative results do not preclude SARS-CoV-2 infection, do not rule out co-infections with other pathogens, and should not be used as the sole basis for treatment or other patient management decisions. Negative results must be combined with clinical observations, patient history, and epidemiological information. The expected result is Negative.  Fact Sheet for Patients: SugarRoll.be  Fact Sheet for Healthcare Providers: https://www.woods-mathews.com/  This test is not yet approved or cleared by the [place] FDA and  has been authorized for detection and/or diagnosis of SARS-CoV-2 by FDA under an Emergency Use Authorization (EUA). This EUA will remain  in effect (meaning this test can be used) for the duration of the COVID-19 declaration under Se ction 564(b)(1) of the Act, 21 U.S.C. section 360bbb-3(b)(1), unless the authorization is terminated or revoked sooner.  Performed at [facility], [place] [address], [place], [place] [address]   I-STAT, chem 8     Status:  Abnormal   Collection Time: 10/24/19  1:56 PM  Result Value Ref Range   Sodium 141 135 - 145 mmol/L   Potassium 3.8 3.5 - 5.1 mmol/L   Chloride 104 98 - 111 mmol/L   BUN 27 (H) 6 - 20 mg/dL   Creatinine, Ser 2.90 (H) 0.61 - 1.24 mg/dL   Glucose, Bld 207 (H) 70 - 99 mg/dL    Comment: Glucose reference range applies only to samples taken after fasting for at least 8 hours.   Calcium, Ion 1.19 1.15 - 1.40 mmol/L   TCO2 21 (L) 22 - 32 mmol/L   Hemoglobin 12.2 (L) 13.0 - 17.0 g/dL   HCT 36.0 (L) 39 - 52 %  Glucose, capillary     Status: Abnormal   Collection Time: 10/24/19  3:36 PM  Result Value Ref Range   Glucose-Capillary 117 (H) 70 - 99 mg/dL    Comment: Glucose reference range applies only to samples taken after fasting for at least 8 hours.    Objective: There were no vitals filed for this  visit.  General: Patient is awake, alert, oriented x 3 and in no acute distress.  Dermatology: Skin is warm and dry with gaping noted to the right lateral foot incision with mild surrounding maceration, staples are not holding but retention sutures intact, There is no malodor, clear to bloody active drainage, minimal erythema, focal edema. No acute signs of infection.   Vascular: Dorsalis Pedis pulse = 1/4,  Posterior Tibial pulse =1/4.   Neurologic: Protective sensation absent.   Musculosketal: Varus foot deformity on right   Xrays,Right foot:Varus deformity, bone resection noted. No gas in soft tissues.   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 -Xrays reviewed -Cleansed right foot surgical wound/ulcer bed and removed loose staples  -Applied betadine and dry sterile dressing and instructed patient to continue with daily dressings at home consisting of same at minimum every other day; supplies provided -Do not pack at this time since there is gapping and maceration, start betadine as above -Awaiting boot/brace from [place] patient to go to the ER or return to office if the wound worsens or if constitutional symptoms are present. -Patient to return to office in 1 week for follow up care and evaluation or sooner if problems arise.  [name], DPM

## 2019-11-10 ENCOUNTER — Other Ambulatory Visit: Payer: Self-pay

## 2019-11-10 ENCOUNTER — Ambulatory Visit (INDEPENDENT_AMBULATORY_CARE_PROVIDER_SITE_OTHER): Payer: Medicaid Other | Admitting: Podiatry

## 2019-11-10 DIAGNOSIS — L97514 Non-pressure chronic ulcer of other part of right foot with necrosis of bone: Secondary | ICD-10-CM

## 2019-11-10 DIAGNOSIS — Z9889 Other specified postprocedural states: Secondary | ICD-10-CM

## 2019-11-10 DIAGNOSIS — E13621 Other specified diabetes mellitus with foot ulcer: Secondary | ICD-10-CM

## 2019-11-10 NOTE — Progress Notes (Signed)
  Subjective:  Patient ID: Jorge Higgins, male    DOB: [birth year],  MRN: [id number]  No chief complaint on file.  DOS: 10/24/19 Procedure: Right foot wound closure   44 y.o. male presents with the above complaint. Thinks the wound continues to improve, denies other issues.  Objective:  Physical Exam: no tenderness at the surgical site, no edema noted and calf supple, nontender. Incision: healing well, no significant erythema, slight clear drainage present, skin edges loosely approximated. Green tinge periphery of wound. Varus deformity present, chronic  Assessment:   1. 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 Post-operative state     Plan:  Patient was evaluated and treated and all questions answered.  Post-operative State -Wound improving -Apply silvadene topically -Awaiting brace from [employer] -Continue to dress daily with silvadene -F/u in 1 week for further suture removal.  No follow-ups on file.

## 2019-11-11 ENCOUNTER — Other Ambulatory Visit: Payer: Self-pay | Admitting: Sports Medicine

## 2019-11-11 DIAGNOSIS — Z9889 Other specified postprocedural states: Secondary | ICD-10-CM

## 2019-11-11 DIAGNOSIS — E13621 Other specified diabetes mellitus with foot ulcer: Secondary | ICD-10-CM

## 2019-11-17 ENCOUNTER — Other Ambulatory Visit: Payer: Self-pay

## 2019-11-17 ENCOUNTER — Ambulatory Visit (INDEPENDENT_AMBULATORY_CARE_PROVIDER_SITE_OTHER): Payer: Medicaid Other | Admitting: Podiatry

## 2019-11-17 DIAGNOSIS — L97514 Non-pressure chronic ulcer of other part of right foot with necrosis of bone: Secondary | ICD-10-CM

## 2019-11-17 DIAGNOSIS — Z9889 Other specified postprocedural states: Secondary | ICD-10-CM

## 2019-11-17 DIAGNOSIS — E13621 Other specified diabetes mellitus with foot ulcer: Secondary | ICD-10-CM

## 2019-11-17 NOTE — Progress Notes (Signed)
  Subjective:  Patient ID: Jorge Higgins, male    DOB: May 05, 1975,  MRN: [id number]  Chief Complaint  Patient presents with  . Wound Check    F/U Rt wound checK pt states,' I feel like it's getting better, but slowly closin up." - pt denis pain/redness/swelling -w/ draiange (bloody ) Tx; silvadene, dressing daily   . Diabetes    FB:S 100 a1C: 9   DOS: 10/24/19 Procedure: Right foot wound closure   44 y.o. male presents with the above complaint. Thinks the wound continues to improve, denies other issues.  Objective:  Physical Exam: no tenderness at the surgical site, no edema noted and calf supple, nontender. Incision: healing well, no significant erythema, slight clear drainage present Varus deformity present, chronic  Assessment:   1. 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 Post-operative state     Plan:  Patient was evaluated and treated and all questions answered.  Post-operative State -Wound improving -Apply silvadene daily -Awaiting brace -Soak twice weekly for thorough cleaning of wound -Most sutures removed as they were not holding wound edges together -Wound edges friable if slow to heal may need repeat debridement and attempt at closure -No need for abx, healing well without s/s of infection  No follow-ups on file.

## 2019-11-24 ENCOUNTER — Ambulatory Visit (INDEPENDENT_AMBULATORY_CARE_PROVIDER_SITE_OTHER): Payer: Medicaid Other | Admitting: Podiatry

## 2019-11-24 ENCOUNTER — Other Ambulatory Visit: Payer: Self-pay

## 2019-11-24 DIAGNOSIS — E13621 Other specified diabetes mellitus with foot ulcer: Secondary | ICD-10-CM

## 2019-11-24 DIAGNOSIS — L97514 Non-pressure chronic ulcer of other part of right foot with necrosis of bone: Secondary | ICD-10-CM

## 2019-12-01 ENCOUNTER — Ambulatory Visit (INDEPENDENT_AMBULATORY_CARE_PROVIDER_SITE_OTHER): Payer: Medicaid Other | Admitting: Podiatry

## 2019-12-01 ENCOUNTER — Other Ambulatory Visit: Payer: Self-pay

## 2019-12-01 DIAGNOSIS — E13621 Other specified diabetes mellitus with foot ulcer: Secondary | ICD-10-CM

## 2019-12-01 DIAGNOSIS — L97514 Non-pressure chronic ulcer of other part of right foot with necrosis of bone: Secondary | ICD-10-CM

## 2019-12-01 NOTE — Progress Notes (Signed)
  Subjective:  Patient ID: Jorge Higgins, male    DOB: Jul 11, 1975,  MRN: [id number]  Chief Complaint  Patient presents with  . Wound Check    F?U Rt wound check- Tx: dry dressing daily and betaine soakcs -= less draianage per pt no redness/swelling   DOS: 10/24/19 Procedure: Right foot wound closure   44 y.o. male presents with the above complaint. Thinks the wound continues to improve, denies other issues.  Objective:  Physical Exam: no tenderness at the surgical site, no edema noted and calf supple, nontender. Incision: healing well, no significant erythema, slight clear drainage present. 3.5x1 Varus deformity present, chronic  Assessment:   1. 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  Post-operative State -Wound improving -Wound debrided as below -Apply silvadene daily -Awaiting brace -Soak twice weekly for thorough cleaning of wound -Wound continues to improve  Procedure: Selective Debridement of Wound Rationale: Removal of devitalized tissue from the wound to promote healing.  Pre-Debridement Wound Measurements: 3.5 cm x 1 cm x 0.2 cm  Post-Debridement Wound Measurements: same as pre-debridement. Type of Debridement: sharp selective Tissue Removed: Devitalized soft-tissue Dressing: Dry, sterile, compression dressing. Disposition: Patient tolerated procedure well. Patient to return in 1 week for follow-up.   No follow-ups on file.

## 2019-12-03 NOTE — Progress Notes (Deleted)
Name: Jorge Higgins  MRN/ DOB: [id number], 11-22-1975   Age/ Sex: 44 y.o., male    PCP: Coy Saunas, MD   Reason for Endocrinology Evaluation: Type 2 Diabetes Mellitus     Date of Initial Endocrinology Visit: 12/03/2019     PATIENT IDENTIFIER: Jorge Higgins is a 44 y.o. male with a past medical history of ***. The patient presented for initial endocrinology clinic visit on 12/03/2019 for consultative assistance with his diabetes management.    HPI: Jorge Higgins was    Diagnosed with DM *** Prior Medications tried/Intolerance: *** Currently checking blood sugars *** x / day,  before breakfast and ***.  Hypoglycemia episodes : ***               Symptoms: ***                 Frequency: ***/  Hemoglobin A1c has ranged from 7.5% in 2021, peaking at 9.1% in 2018. Patient required assistance for hypoglycemia:  Patient has required hospitalization within the last 1 year from hyper or hypoglycemia:   In terms of diet, the patient ***   HOME DIABETES REGIMEN: Glipizide 5 mg daily  Lantus  Humalog    Statin: no ACE-I/ARB: yes Prior Diabetic Education: {Yes/No:11203}   METER DOWNLOAD SUMMARY: Date range evaluated: *** Fingerstick Blood Glucose Tests = *** Average Number Tests/Day = *** Overall Mean FS Glucose = *** Standard Deviation = ***  BG Ranges: Low = *** High = ***   Hypoglycemic Events/30 Days: BG < 50 = *** Episodes of symptomatic severe hypoglycemia = ***   DIABETIC COMPLICATIONS: Microvascular complications:   CKD  Denies: ***  Last eye exam: Completed   Macrovascular complications:   ***  Denies: CAD, PVD, CVA   PAST HISTORY: Past Medical History:  Past Medical History:  Diagnosis Date  . Anemia of renal disease   . Chronic osteomyelitis (HCC)    s/p  toes amputation's  . CKD (chronic kidney disease), stage III    --- dr hida sheikh  . Complication of anesthesia   . Depression   . Diabetic foot ulcer ([place])    right     . Diabetic neuropathy ([place])   . ED (erectile dysfunction)   . GERD (gastroesophageal reflux disease)   . History of syncope    03/ 2021  near syncope episode (admission at [facility])  hyperkalemia and orthostatic hypotension  (07-24-2019  per pt was side effects of taking levaquin and has ckd)  . Hyperlipidemia   . Hypertension    followed by pcp   (07-24-2019 per pt never had a stress test)  . PONV (postoperative nausea and vomiting)   . Retinopathy   . Type 2 diabetes mellitus treated with insulin ([place])    followed by pcp   (10-21-2019 pt stated checks blood sugar TID,  fasting sugar-- 110    Past Surgical History:  Past Surgical History:  Procedure Laterality Date  . AMPUTATION Left 09/19/2016   Procedure: PARTIAL 5th RAY AMPUTATION;  Surgeon: [name], DPM;  Location: [place];  Service: Podiatry;  Laterality: Left;  . AMPUTATION TOE Left 2019   complete amputation 5th toe  . FOOT SURGERY Right x2  in 04/2019;  x1 02/ 2021  @ [facility]   includes taking a bone out , insertion of spacer, debridement, wound vac placement  . INCISION AND DRAINAGE OF WOUND Right 10/24/2019   Procedure: IRRIGATION AND DEBRIDEMENT WOUND AND CLOSURE;  Surgeon: [name], DPM;  Location: [facility];  Service: Podiatry;  Laterality: Right;  LOCAL  . IRRIGATION AND DEBRIDEMENT FOOT Right 07/30/2019   Procedure: IRRIGATION AND DEBRIDEMENT FOOT, ANTIBIOTIC SPACER EXCHANGE, WOUND CLOSURE;  Surgeon: [name], DPM;  Location: [place];  Service: Podiatry;  Laterality: Right;  Antibiotic cement, Vancomycin powder, pulse lavage  . TOE AMPUTATION Left 11/13/2015   4th toe      Social History:  reports that he quit smoking about 6 years ago. His smoking use included cigarettes. He quit after 6.00 years of use. He has never used smokeless tobacco. He reports that he does not drink alcohol and does not use drugs. Family History:  Family History   Problem Relation Age of Onset  . Diabetes Mellitus II Mother   . Hypertension Mother   . Diabetes Mellitus II Sister   . Alzheimer's disease Father   . Diabetes Maternal Grandmother   . Hypertension Maternal Grandmother   . Diabetes Maternal Grandfather   . Hypertension Maternal Grandfather   . Diabetes Paternal Grandmother   . Hypertension Paternal Grandmother   . Diabetes Paternal Grandfather   . Hypertension Paternal Grandfather       HOME MEDICATIONS: Allergies as of 12/04/2019      Reactions   Azithromycin Nausea And Vomiting, Shortness Of Breath, Other (See Comments)   Levaquin [levofloxacin]    Dizziness, nausea, weakness, heart races   Lipitor [atorvastatin] Other (See Comments)   Dizziness, heart races, weakness   Metoprolol Other (See Comments)   Elevates Potassium too much   Hydromorphone Nausea And Vomiting   Nickel Rash      Medication List       Accurate as of December 03, 2019  4:18 PM. If you have any questions, ask your nurse or doctor.        albuterol 108 (90 Base) MCG/ACT inhaler Commonly known as: VENTOLIN HFA Inhale into the lungs every 4 (four) hours as needed.   amLODipine 5 MG tablet Commonly known as: NORVASC Take 1 tablet (5 mg total) by mouth daily.   amLODipine 10 MG tablet Commonly known as: NORVASC Take 10 mg by mouth daily.   aspirin 81 MG EC tablet Take by mouth.   B-12 2500 MCG Tabs Take by mouth daily.   BD Pen Needle Nano 2nd Gen 32G X 4 MM Misc Generic drug: Insulin Pen Needle USE TO INJECT DAILY LANTUS SOLOSTAR DOSE AND THREE TIMES DAILY HUMALOG KWIKPEN DOSE   cefpodoxime 200 MG tablet Commonly known as: VANTIN Take 200 mg by mouth daily.   Ergocalciferol 50 MCG (2000 UT) Tabs Take by mouth.   famotidine 20 MG tablet Commonly known as: PEPCID Take 20 mg by mouth 2 (two) times daily.   ferrous sulfate 325 (65 FE) MG EC tablet Take 325 mg by mouth daily.   glipiZIDE 5 MG tablet Commonly known as:  GLUCOTROL Take 5 mg by mouth 2 (two) times daily before a meal.   HYDROcodone-acetaminophen 5-325 MG tablet Commonly known as: NORCO/VICODIN Take 1 tablet by mouth every 4 (four) hours as needed for moderate pain.   HYDROcodone-homatropine 5-1.5 MG/5ML syrup Commonly known as: HYCODAN Take by mouth.   insulin glargine 100 UNIT/ML Solostar Pen Commonly known as: LANTUS Inject 5 Units into the skin at bedtime.   insulin lispro 100 UNIT/ML KwikPen Commonly known as: HUMALOG Inject into the skin.   losartan 25 MG tablet Commonly known as: COZAAR Take 25 mg  by mouth daily.   Omega-3 1000 MG Caps Take by mouth daily.   ondansetron 4 MG disintegrating tablet Commonly known as: ZOFRAN-ODT Take 4 mg by mouth every 8 (eight) hours as needed.   ondansetron 4 MG tablet Commonly known as: Zofran Take 1 tablet (4 mg total) by mouth every 8 (eight) hours as needed for nausea or vomiting.   predniSONE 20 MG tablet Commonly known as: DELTASONE Take 40 mg by mouth daily.   promethazine 25 MG tablet Commonly known as: PHENERGAN Take 1 tablet (25 mg total) by mouth every 8 (eight) hours as needed for nausea or vomiting.   sertraline 50 MG tablet Commonly known as: ZOLOFT Take 50 mg by mouth daily.   silver sulfADIAZINE 1 % cream Commonly known as: Silvadene Apply pea-sized amount to wound every other day.   Vitamin C 500 MG Caps Take by mouth daily.        ALLERGIES: Allergies  Allergen Reactions  . Azithromycin Nausea And Vomiting, Shortness Of Breath and Other (See Comments)  . Levaquin [Levofloxacin]     Dizziness, nausea, weakness, heart races  . Lipitor [Atorvastatin] Other (See Comments)    Dizziness, heart races, weakness  . Metoprolol Other (See Comments)    Elevates Potassium too much  . Hydromorphone Nausea And Vomiting  . Nickel Rash     REVIEW OF SYSTEMS: A comprehensive ROS was conducted with the patient and is negative except as per HPI and below:   ROS    OBJECTIVE:   VITAL SIGNS: There were no vitals taken for this visit.   PHYSICAL EXAM:  General: Pt appears well and is in NAD  Hydration: Well-hydrated with moist mucous membranes and good skin turgor  HEENT: Head: Unremarkable with good dentition. Oropharynx clear without exudate.  Eyes: External eye exam normal without stare, lid lag or exophthalmos.  EOM intact.  PERRL.  Neck: General: Supple without adenopathy or carotid bruits. Thyroid: Thyroid size normal.  No goiter or nodules appreciated. No thyroid bruit.  Lungs: Clear with good BS bilat with no rales, rhonchi, or wheezes  Heart: RRR with normal S1 and S2 and no gallops; no murmurs; no rub  Abdomen: Normoactive bowel sounds, soft, nontender, without masses or organomegaly palpable  Extremities:  Lower extremities - No pretibial edema. No lesions.  Skin: Normal texture and temperature to palpation. No rash noted. No Acanthosis nigricans/skin tags. No lipohypertrophy.  Neuro: MS is good with appropriate affect, pt is alert and Ox3    DM foot exam:    DATA REVIEWED:  Lab Results  Component Value Date   HGBA1C 7.5 (H) 06/25/2019   HGBA1C 9.1 (H) 08/17/2016   Lab Results  Component Value Date   CREATININE 2.90 (H) 10/24/2019   No results found for: MICRALBCREAT  No results found for: CHOL, HDL, LDLCALC, LDLDIRECT, TRIG, CHOLHDL      ASSESSMENT / PLAN / RECOMMENDATIONS:   1) Type *** Diabetes Mellitus, ***controlled, With*** complications - Most recent A1c of *** %. Goal A1c < *** %.  ***  Plan: GENERAL:  ***  MEDICATIONS:  ***  EDUCATION / INSTRUCTIONS:  BG monitoring instructions: Patient is instructed to check his blood sugars *** times a day, ***.  Call [place] [facility] if: BG persistently < 70 or > 300. . I reviewed the Rule of 15 for the treatment of hypoglycemia in detail with the patient. Literature supplied.   2) Diabetic complications:   Eye: Does *** have known  diabetic retinopathy.  Neuro/ Feet: Does *** have known diabetic peripheral neuropathy.  Renal: Patient does *** have known baseline CKD. He is *** on an ACEI/ARB at present.Check urine albumin/creatinine ratio yearly starting at time of diagnosis. If albuminuria is positive, treatment is geared toward better glucose, blood pressure control and use of ACE inhibitors or ARBs. Monitor electrolytes and creatinine once to twice yearly.   3) Lipids: Patient is *** on a statin.    4) Hypertension: ***  at goal of < 140/90 mmHg.       Signed electronically by: Mack Guise, MD  [facility] Endocrinology  [facility] [address] [place], [place] [address] Phone: [phone] FAX: [phone]   CC: Coy Saunas, MD No address on file Phone: None  Fax: None    Return to Endocrinology clinic as below: Future Appointments  Date Time Provider [place]  12/04/2019  8:10 AM Peniel Biel, Melanie Crazier, MD [facility] None  12/11/2019 10:45 AM March Rummage Christian Mate, DPM [facility]

## 2019-12-04 ENCOUNTER — Ambulatory Visit: Payer: Medicaid Other | Admitting: Internal Medicine

## 2019-12-04 DIAGNOSIS — M79676 Pain in unspecified toe(s): Secondary | ICD-10-CM

## 2019-12-04 NOTE — Progress Notes (Signed)
  Subjective:  Patient ID: Jorge Higgins, male    DOB: 24-Jun-1975,  MRN: [id number]  Chief Complaint  Patient presents with  . Wound Check    F/U Rt wound check pt deneis N/V/F?Ch - Tx: silvadene and dressing daily -per pt is improving and closing up   . Diabetes    FBS: 80   DOS: 10/24/19 Procedure: Right foot wound closure   44 y.o. male presents with the above complaint. Thinks the wound continues to improve, denies other issues.  Objective:  Physical Exam: no tenderness at the surgical site, no edema noted and calf supple, nontender. Incision: healing well, no significant erythema, slight clear drainage present. Measures 3.5x0.5 Varus deformity present, chronic  Assessment:   1. 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  Post-operative State -Wound improving -Apply silvadene daily -Awaiting brace -Soak twice weekly for thorough cleaning of wound -Wound edges gently curettaged.  -Wound edges friable if slow to heal may need repeat debridement and attempt at closure  No follow-ups on file.

## 2019-12-11 ENCOUNTER — Ambulatory Visit: Payer: Medicaid Other | Admitting: Podiatry

## 2019-12-16 DIAGNOSIS — M79676 Pain in unspecified toe(s): Secondary | ICD-10-CM

## 2019-12-22 ENCOUNTER — Other Ambulatory Visit: Payer: Self-pay

## 2019-12-22 ENCOUNTER — Encounter: Payer: Self-pay | Admitting: Podiatry

## 2019-12-22 ENCOUNTER — Ambulatory Visit (INDEPENDENT_AMBULATORY_CARE_PROVIDER_SITE_OTHER): Payer: Medicaid Other

## 2019-12-22 ENCOUNTER — Ambulatory Visit: Payer: Medicaid Other | Admitting: Podiatry

## 2019-12-22 DIAGNOSIS — L97514 Non-pressure chronic ulcer of other part of right foot with necrosis of bone: Secondary | ICD-10-CM | POA: Diagnosis not present

## 2019-12-22 DIAGNOSIS — M86671 Other chronic osteomyelitis, right ankle and foot: Secondary | ICD-10-CM

## 2019-12-22 NOTE — Progress Notes (Signed)
  Subjective:  Patient ID: Jorge Higgins, male    DOB: 08/28/1975,  MRN: [id number]  Chief Complaint  Patient presents with  . Wound Check    the spot on the right foot has some draining and the boot seems to make it worse   DOS: 10/24/19 Procedure: Right foot wound closure   44 y.o. male presents with the above complaint. States he can wear the boot without pain, however the wound is worse and has been bleeding.  Objective:  Physical Exam: no tenderness at the surgical site, no edema noted and calf supple, nontender. Incision: wound worsened since last visit, no significant erythema, slight clear drainage present. 3.5x5 that probes  Varus deformity present, chronic  Radiographs: chronic varus deformity, no definite further osseous erosions. Questionable area of periosteal reaction at distal inferior calcaneus. Assessment:   1. 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 Chronic osteomyelitis involving right ankle and foot (Jorge Higgins)   3. Other chronic osteomyelitis of right foot (Jorge Higgins)    Plan:  Patient was evaluated and treated and all questions answered.  Post-operative State -Wound worsened today 2/2 wearing down from the boot. Will await further WB in the boot until the wound heals. -Minimally debrided today and dressed with betadine WTD -No signs of acute infection today warranting Abx. -Check CBC, ESR, CRP -XR taken and reviewed. Possible small area of calcaneal periosteal reaction -From a surgical standpoint he might be a good candidate for a TA transfer.  Return in about 1 week (around 12/29/2019) for Wound Care, Right.

## 2019-12-26 LAB — CBC WITH DIFFERENTIAL/PLATELET
Absolute Monocytes: 446 cells/uL (ref 200–950)
Basophils Absolute: 65 cells/uL (ref 0–200)
Basophils Relative: 0.7 %
Eosinophils Absolute: 381 cells/uL (ref 15–500)
Eosinophils Relative: 4.1 %
HCT: 38.5 % (ref 38.5–50.0)
Hemoglobin: 12.6 g/dL — ABNORMAL LOW (ref 13.2–17.1)
Lymphs Abs: 1953 cells/uL (ref 850–3900)
MCH: 28.6 pg (ref 27.0–33.0)
MCHC: 32.7 g/dL (ref 32.0–36.0)
MCV: 87.5 fL (ref 80.0–100.0)
MPV: 9.8 fL (ref 7.5–12.5)
Monocytes Relative: 4.8 %
Neutro Abs: 6454 cells/uL (ref 1500–7800)
Neutrophils Relative %: 69.4 %
Platelets: 531 10*3/uL — ABNORMAL HIGH (ref 140–400)
RBC: 4.4 10*6/uL (ref 4.20–5.80)
RDW: 13.1 % (ref 11.0–15.0)
Total Lymphocyte: 21 %
WBC: 9.3 10*3/uL (ref 3.8–10.8)

## 2019-12-26 LAB — BASIC METABOLIC PANEL
BUN/Creatinine Ratio: 10 (calc) (ref 6–22)
BUN: 30 mg/dL — ABNORMAL HIGH (ref 7–25)
CO2: 25 mmol/L (ref 20–32)
Calcium: 8.7 mg/dL (ref 8.6–10.3)
Chloride: 104 mmol/L (ref 98–110)
Creat: 2.97 mg/dL — ABNORMAL HIGH (ref 0.60–1.35)
Glucose, Bld: 149 mg/dL — ABNORMAL HIGH (ref 65–99)
Potassium: 4.7 mmol/L (ref 3.5–5.3)
Sodium: 135 mmol/L (ref 135–146)

## 2019-12-26 LAB — SEDIMENTATION RATE: Sed Rate: 126 mm/h — ABNORMAL HIGH (ref 0–15)

## 2019-12-28 ENCOUNTER — Ambulatory Visit
Admission: RE | Admit: 2019-12-28 | Discharge: 2019-12-28 | Disposition: A | Discharge status: Home / Self Care | Payer: Medicaid Other | Source: Ambulatory Visit | Attending: Podiatry | Admitting: Podiatry

## 2019-12-28 DIAGNOSIS — M86671 Other chronic osteomyelitis, right ankle and foot: Secondary | ICD-10-CM

## 2019-12-29 ENCOUNTER — Other Ambulatory Visit: Payer: Self-pay

## 2019-12-29 ENCOUNTER — Encounter: Payer: Self-pay | Admitting: Podiatry

## 2019-12-29 ENCOUNTER — Ambulatory Visit: Payer: Medicaid Other | Admitting: Podiatry

## 2019-12-29 DIAGNOSIS — M86671 Other chronic osteomyelitis, right ankle and foot: Secondary | ICD-10-CM

## 2019-12-29 DIAGNOSIS — M21171 Varus deformity, not elsewhere classified, right ankle: Secondary | ICD-10-CM

## 2019-12-29 DIAGNOSIS — M79671 Pain in right foot: Secondary | ICD-10-CM

## 2019-12-29 DIAGNOSIS — L97514 Non-pressure chronic ulcer of other part of right foot with necrosis of bone: Secondary | ICD-10-CM | POA: Diagnosis not present

## 2019-12-29 NOTE — H&P (View-Only) (Signed)
  Subjective:  Patient ID: David Chuck Hint, male    DOB: [birth year],  MRN: [id number]  Chief Complaint  Patient presents with  . Foot Ulcer    i am doing alot better and i think that it is starting to heal on the right foot    DOS: 10/24/19 Procedure: Right foot wound closure   44 y.o. male presents with the above complaint. States he can wear the boot without pain, however the wound is worse and has been bleeding.  Objective:  Physical Exam: no tenderness at the surgical site, no edema noted and calf supple, nontender. Incision: wound worsened since last visit, no significant erythema, slight clear drainage present. 3.5x3 that probes to bone Varus deformity present, chronic  Radiographs: chronic varus deformity, no definite further osseous erosions. Questionable area of periosteal reaction at distal inferior calcaneus. Assessment:   1. 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 Chronic osteomyelitis involving right ankle and foot ([place])   3. Acquired varus deformity of right ankle   4. Right foot pain    Plan:  Patient was evaluated and treated and all questions answered.  Post-operative State -Wound improved c/t previous -MRI and labs reviewed with patient -Wound dressed with Prisma and DSD. -Discussed with patient that osteomyelitis will make limb salvage more difficult. Discussed proceeding with salvage procedure including debridement of the wound including the bone. His hope for limb salvage is to get this wound healed and the infection under control and then consider recostructive surgery with TTC and tendon transfer. At this point I think it is still reasonable to trial salvage but discussed with patient if I think that amputation is inevitable we will discuss it. Patient still would like to trial salvage. -Avoid abx today so as to not mask infection at time of surgery. -Patient has failed  all conservative therapy and wishes to proceed with surgical intervention. All risks, benefits, and alternatives discussed with patient. No guarantees given. Consent reviewed and signed by patient. -Planned procedures: right foot wound debridement including bone debridement, bone biopsy

## 2019-12-29 NOTE — Progress Notes (Signed)
  Subjective:  Patient ID: Jorge Higgins, male    DOB: 1975-12-31,  MRN: [id number]  Chief Complaint  Patient presents with  . Foot Ulcer    i am doing alot better and i think that it is starting to heal on the right foot    DOS: 10/24/19 Procedure: Right foot wound closure   44 y.o. male presents with the above complaint. States he can wear the boot without pain, however the wound is worse and has been bleeding.  Objective:  Physical Exam: no tenderness at the surgical site, no edema noted and calf supple, nontender. Incision: wound worsened since last visit, no significant erythema, slight clear drainage present. 3.5x3 that probes to bone Varus deformity present, chronic  Radiographs: chronic varus deformity, no definite further osseous erosions. Questionable area of periosteal reaction at distal inferior calcaneus. Assessment:   1. 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 Chronic osteomyelitis involving right ankle and foot ([place])   3. Acquired varus deformity of right ankle   4. Right foot pain    Plan:  Patient was evaluated and treated and all questions answered.  Post-operative State -Wound improved c/t previous -MRI and labs reviewed with patient -Wound dressed with Prisma and DSD. -Discussed with patient that osteomyelitis will make limb salvage more difficult. Discussed proceeding with salvage procedure including debridement of the wound including the bone. His hope for limb salvage is to get this wound healed and the infection under control and then consider recostructive surgery with TTC and tendon transfer. At this point I think it is still reasonable to trial salvage but discussed with patient if I think that amputation is inevitable we will discuss it. Patient still would like to trial salvage. -Avoid abx today so as to not mask infection at time of surgery. -Patient has failed  all conservative therapy and wishes to proceed with surgical intervention. All risks, benefits, and alternatives discussed with patient. No guarantees given. Consent reviewed and signed by patient. -Planned procedures: right foot wound debridement including bone debridement, bone biopsy

## 2019-12-31 ENCOUNTER — Encounter (HOSPITAL_BASED_OUTPATIENT_CLINIC_OR_DEPARTMENT_OTHER): Payer: Self-pay | Admitting: Podiatry

## 2019-12-31 ENCOUNTER — Other Ambulatory Visit: Payer: Self-pay

## 2019-12-31 NOTE — Progress Notes (Signed)
Chart reviewed with Dr. Ambrose Pancoast, ok to proceed as scheduled.

## 2020-01-02 ENCOUNTER — Telehealth: Payer: Self-pay

## 2020-01-02 NOTE — Telephone Encounter (Signed)
DOS 01/08/2020  WOUND DEBRIDEMENT RT - 11044 BIOPSY BONE RT - 20240   NOTIFICATION/PRIOR AUTHORIZATION NUMBER CASE STATUS CASE STATUS REASON PRIMARY CARE PHYSICIAN [id number] Closed Case Was Managed And Is Now Complete [place] NOTIFY DATE/TIME ADMISSION NOTIFY DATE/TIME 12/31/2019 09:33 AM CDT - COVERAGE STATUS OVERALL COVERAGE STATUS Covered/Approved 1-2 CODE DESCRIPTION COVERAGE STATUS DECISION DATE [place] Coverage determination is reflected for the facility admission and is not a guarantee of payment for ongoing services. Covered/Approved 01/02/2020 1 11044 Debridement, bone (includes epidermis, d more Covered/Approved 01/02/2020 2 20240 Biopsy, bone, open; superficial (eg, ste more Covered/Approved 01/02/2020  auth good from 01/08/20 - 04/02/2020

## 2020-01-05 ENCOUNTER — Other Ambulatory Visit (HOSPITAL_COMMUNITY)
Admission: RE | Admit: 2020-01-05 | Discharge: 2020-01-05 | Disposition: A | Discharge status: Home / Self Care | Payer: Medicaid Other | Source: Ambulatory Visit | Attending: Podiatry | Admitting: Podiatry

## 2020-01-05 DIAGNOSIS — Z01812 Encounter for preprocedural laboratory examination: Secondary | ICD-10-CM | POA: Insufficient documentation

## 2020-01-05 DIAGNOSIS — Z20822 Contact with and (suspected) exposure to covid-19: Secondary | ICD-10-CM | POA: Diagnosis not present

## 2020-01-05 LAB — SARS CORONAVIRUS 2 (TAT 6-24 HRS): SARS Coronavirus 2: NEGATIVE

## 2020-01-08 ENCOUNTER — Encounter (HOSPITAL_BASED_OUTPATIENT_CLINIC_OR_DEPARTMENT_OTHER): Payer: Self-pay | Admitting: Podiatry

## 2020-01-08 ENCOUNTER — Encounter (HOSPITAL_BASED_OUTPATIENT_CLINIC_OR_DEPARTMENT_OTHER)
Admission: RE | Disposition: A | Discharge status: Home / Self Care | Payer: Self-pay | Source: Home / Self Care | Attending: Podiatry

## 2020-01-08 ENCOUNTER — Ambulatory Visit (HOSPITAL_BASED_OUTPATIENT_CLINIC_OR_DEPARTMENT_OTHER)
Admission: RE | Admit: 2020-01-08 | Discharge: 2020-01-08 | Disposition: A | Discharge status: Home / Self Care | Payer: Medicaid Other | Attending: Podiatry | Admitting: Podiatry

## 2020-01-08 ENCOUNTER — Ambulatory Visit (HOSPITAL_BASED_OUTPATIENT_CLINIC_OR_DEPARTMENT_OTHER): Payer: Medicaid Other | Admitting: Anesthesiology

## 2020-01-08 DIAGNOSIS — E11621 Type 2 diabetes mellitus with foot ulcer: Secondary | ICD-10-CM | POA: Diagnosis present

## 2020-01-08 DIAGNOSIS — M86671 Other chronic osteomyelitis, right ankle and foot: Secondary | ICD-10-CM | POA: Diagnosis not present

## 2020-01-08 DIAGNOSIS — Z87891 Personal history of nicotine dependence: Secondary | ICD-10-CM | POA: Insufficient documentation

## 2020-01-08 DIAGNOSIS — L97509 Non-pressure chronic ulcer of other part of unspecified foot with unspecified severity: Secondary | ICD-10-CM

## 2020-01-08 DIAGNOSIS — L97519 Non-pressure chronic ulcer of other part of right foot with unspecified severity: Secondary | ICD-10-CM | POA: Insufficient documentation

## 2020-01-08 DIAGNOSIS — L97514 Non-pressure chronic ulcer of other part of right foot with necrosis of bone: Secondary | ICD-10-CM | POA: Diagnosis not present

## 2020-01-08 DIAGNOSIS — I10 Essential (primary) hypertension: Secondary | ICD-10-CM | POA: Insufficient documentation

## 2020-01-08 HISTORY — PX: INCISION AND DRAINAGE OF WOUND: SHX1803

## 2020-01-08 HISTORY — PX: BONE BIOPSY: SHX375

## 2020-01-08 LAB — GLUCOSE, CAPILLARY
Glucose-Capillary: 196 mg/dL — ABNORMAL HIGH (ref 70–99)
Glucose-Capillary: 171 mg/dL — ABNORMAL HIGH (ref 70–99)

## 2020-01-08 SURGERY — IRRIGATION AND DEBRIDEMENT WOUND
Anesthesia: Monitor Anesthesia Care | Site: Foot | Laterality: Right

## 2020-01-08 MED ORDER — LIDOCAINE HCL (CARDIAC) PF 100 MG/5ML IV SOSY
PREFILLED_SYRINGE | INTRAVENOUS | Status: DC | PRN
  Administered 2020-01-08: 50 mg via INTRAVENOUS

## 2020-01-08 MED ORDER — BUPIVACAINE HCL (PF) 0.5 % IJ SOLN
INTRAMUSCULAR | Status: DC | PRN
  Administered 2020-01-08: 10 mL

## 2020-01-08 MED ORDER — GLYCOPYRROLATE 0.2 MG/ML IJ SOLN
INTRAMUSCULAR | Status: DC | PRN
  Administered 2020-01-08: .2 mg via INTRAVENOUS

## 2020-01-08 MED ORDER — CLINDAMYCIN PHOSPHATE 900 MG/50ML IV SOLN
INTRAVENOUS | Status: DC | PRN
  Administered 2020-01-08: 900 mg via INTRAVENOUS

## 2020-01-08 MED ORDER — LACTATED RINGERS IV SOLN: INTRAVENOUS | Status: DC

## 2020-01-08 MED ORDER — BUPIVACAINE HCL (PF) 0.5 % IJ SOLN
INTRAMUSCULAR | Status: AC
  Filled 2020-01-08: qty 30

## 2020-01-08 MED ORDER — PROMETHAZINE HCL 25 MG/ML IJ SOLN: 6.2500 mg | INTRAMUSCULAR | Status: DC | PRN

## 2020-01-08 MED ORDER — OXYCODONE-ACETAMINOPHEN 5-325 MG PO TABS: 1.0000 | ORAL_TABLET | ORAL | 0 refills | Status: DC | PRN

## 2020-01-08 MED ORDER — ONDANSETRON HCL 4 MG PO TABS: 4.0000 mg | ORAL_TABLET | Freq: Three times a day (TID) | ORAL | 0 refills | Status: AC | PRN

## 2020-01-08 MED ORDER — GLYCOPYRROLATE 0.2 MG/ML IJ SOLN
INTRAMUSCULAR | Status: AC
  Filled 2020-01-08: qty 3

## 2020-01-08 MED ORDER — OXYCODONE HCL 5 MG PO TABS: 5.0000 mg | ORAL_TABLET | Freq: Once | ORAL | Status: DC | PRN

## 2020-01-08 MED ORDER — FENTANYL CITRATE (PF) 100 MCG/2ML IJ SOLN: 25.0000 ug | INTRAMUSCULAR | Status: DC | PRN

## 2020-01-08 MED ORDER — PROPOFOL 10 MG/ML IV BOLUS
INTRAVENOUS | Status: DC | PRN
  Administered 2020-01-08 (×2): 30 mg via INTRAVENOUS

## 2020-01-08 MED ORDER — ONDANSETRON HCL 4 MG/2ML IJ SOLN
INTRAMUSCULAR | Status: AC
  Filled 2020-01-08: qty 2

## 2020-01-08 MED ORDER — LIDOCAINE 2% (20 MG/ML) 5 ML SYRINGE
INTRAMUSCULAR | Status: AC
  Filled 2020-01-08: qty 5

## 2020-01-08 MED ORDER — ONDANSETRON HCL 4 MG/2ML IJ SOLN
INTRAMUSCULAR | Status: DC | PRN
  Administered 2020-01-08: 4 mg via INTRAVENOUS

## 2020-01-08 MED ORDER — CLINDAMYCIN HCL 300 MG PO CAPS: 300.0000 mg | ORAL_CAPSULE | Freq: Three times a day (TID) | ORAL | 0 refills | Status: AC

## 2020-01-08 MED ORDER — PROPOFOL 500 MG/50ML IV EMUL
INTRAVENOUS | Status: DC | PRN
  Administered 2020-01-08: 50 ug/kg/min via INTRAVENOUS

## 2020-01-08 MED ORDER — SODIUM CHLORIDE 0.9 % IR SOLN
Status: DC | PRN
  Administered 2020-01-08: 3000 mL

## 2020-01-08 MED ORDER — CLINDAMYCIN PHOSPHATE 900 MG/50ML IV SOLN
INTRAVENOUS | Status: AC
  Filled 2020-01-08: qty 50

## 2020-01-08 MED ORDER — FENTANYL CITRATE (PF) 100 MCG/2ML IJ SOLN
INTRAMUSCULAR | Status: AC
  Filled 2020-01-08: qty 2

## 2020-01-08 MED ORDER — FENTANYL CITRATE (PF) 100 MCG/2ML IJ SOLN
INTRAMUSCULAR | Status: DC | PRN
  Administered 2020-01-08: 50 ug via INTRAVENOUS
  Administered 2020-01-08: 25 ug via INTRAVENOUS

## 2020-01-08 MED ORDER — OXYCODONE HCL 5 MG/5ML PO SOLN: 5.0000 mg | Freq: Once | ORAL | Status: DC | PRN

## 2020-01-08 MED ORDER — VANCOMYCIN HCL 1000 MG IV SOLR
INTRAVENOUS | Status: AC
  Filled 2020-01-08: qty 1000

## 2020-01-08 MED ORDER — DEXAMETHASONE SODIUM PHOSPHATE 10 MG/ML IJ SOLN
INTRAMUSCULAR | Status: DC | PRN
  Administered 2020-01-08: 5 mg via INTRAVENOUS

## 2020-01-08 MED ORDER — PROPOFOL 500 MG/50ML IV EMUL
INTRAVENOUS | Status: AC
  Filled 2020-01-08: qty 50

## 2020-01-08 MED ORDER — BUPIVACAINE HCL (PF) 0.25 % IJ SOLN
INTRAMUSCULAR | Status: AC
  Filled 2020-01-08: qty 30

## 2020-01-08 MED ORDER — MIDAZOLAM HCL 2 MG/2ML IJ SOLN
INTRAMUSCULAR | Status: DC | PRN
  Administered 2020-01-08 (×2): 1 mg via INTRAVENOUS

## 2020-01-08 MED ORDER — MIDAZOLAM HCL 2 MG/2ML IJ SOLN
INTRAMUSCULAR | Status: AC
  Filled 2020-01-08: qty 2

## 2020-01-08 MED ORDER — DEXAMETHASONE SODIUM PHOSPHATE 10 MG/ML IJ SOLN
INTRAMUSCULAR | Status: AC
  Filled 2020-01-08: qty 1

## 2020-01-08 SURGICAL SUPPLY — 61 items
APL PRP STRL LF DISP 70% ISPRP (MISCELLANEOUS)
BLADE HEX COATED 2.75 (ELECTRODE) ×3 IMPLANT
BLADE SURG 15 STRL LF DISP TIS (BLADE) ×1 IMPLANT
BLADE SURG 15 STRL SS (BLADE) ×3
BNDG CMPR 9X4 STRL LF SNTH (GAUZE/BANDAGES/DRESSINGS)
BNDG ELASTIC 3X5.8 VLCR STR LF (GAUZE/BANDAGES/DRESSINGS) ×1 IMPLANT
BNDG ELASTIC 4X5.8 VLCR STR LF (GAUZE/BANDAGES/DRESSINGS) ×2 IMPLANT
BNDG ESMARK 4X9 LF (GAUZE/BANDAGES/DRESSINGS) ×1 IMPLANT
BNDG GAUZE ELAST 4 BULKY (GAUZE/BANDAGES/DRESSINGS) ×3 IMPLANT
CHLORAPREP W/TINT 26 (MISCELLANEOUS) ×1 IMPLANT
CONT SPEC 4OZ CLIKSEAL STRL BL (MISCELLANEOUS) ×4 IMPLANT
COVER BACK TABLE 60X90IN (DRAPES) ×3 IMPLANT
COVER WAND RF STERILE (DRAPES) IMPLANT
CUFF TOURN SGL QUICK 18X4 (TOURNIQUET CUFF) IMPLANT
DRAPE EXTREMITY T 121X128X90 (DISPOSABLE) ×3 IMPLANT
DRAPE IMP U-DRAPE 54X76 (DRAPES) ×1 IMPLANT
DRAPE U-SHAPE 47X51 STRL (DRAPES) ×1 IMPLANT
DRSG EMULSION OIL 3X3 NADH (GAUZE/BANDAGES/DRESSINGS) ×1 IMPLANT
DRSG PAD ABDOMINAL 8X10 ST (GAUZE/BANDAGES/DRESSINGS) IMPLANT
ELECT REM PT RETURN 9FT ADLT (ELECTROSURGICAL) ×3
ELECTRODE REM PT RTRN 9FT ADLT (ELECTROSURGICAL) ×1 IMPLANT
GAUZE 4X4 16PLY RFD (DISPOSABLE) ×2 IMPLANT
GAUZE SPONGE 4X4 12PLY STRL (GAUZE/BANDAGES/DRESSINGS) ×3 IMPLANT
GLOVE BIO SURGEON STRL SZ 6.5 (GLOVE) ×1 IMPLANT
GLOVE BIO SURGEON STRL SZ7.5 (GLOVE) ×2 IMPLANT
GLOVE BIO SURGEONS STRL SZ 6.5 (GLOVE)
GLOVE BIOGEL PI IND STRL 6.5 (GLOVE) ×1 IMPLANT
GLOVE BIOGEL PI IND STRL 7.5 (GLOVE) IMPLANT
GLOVE BIOGEL PI INDICATOR 6.5 (GLOVE)
GLOVE BIOGEL PI INDICATOR 7.5 (GLOVE) ×2
GOWN STRL REUS W/ TWL LRG LVL3 (GOWN DISPOSABLE) ×1 IMPLANT
GOWN STRL REUS W/ TWL XL LVL3 (GOWN DISPOSABLE) ×1 IMPLANT
GOWN STRL REUS W/TWL LRG LVL3 (GOWN DISPOSABLE) ×3
GOWN STRL REUS W/TWL XL LVL3 (GOWN DISPOSABLE) ×3
HANDPIECE INTERPULSE COAX TIP (DISPOSABLE) ×3
MANIFOLD NEPTUNE II (INSTRUMENTS) ×3 IMPLANT
NDL HYPO 25X1 1.5 SAFETY (NEEDLE) ×1 IMPLANT
NEEDLE HYPO 25X1 1.5 SAFETY (NEEDLE) ×3 IMPLANT
NS IRRIG 1000ML POUR BTL (IV SOLUTION) ×2 IMPLANT
PACK BASIN DAY SURGERY FS (CUSTOM PROCEDURE TRAY) ×3 IMPLANT
PADDING CAST ABS 4INX4YD NS (CAST SUPPLIES)
PADDING CAST ABS COTTON 4X4 ST (CAST SUPPLIES) ×1 IMPLANT
PENCIL SMOKE EVACUATOR (MISCELLANEOUS) ×3 IMPLANT
SET HNDPC FAN SPRY TIP SCT (DISPOSABLE) IMPLANT
SET IRRIG Y TYPE TUR BLADDER L (SET/KITS/TRAYS/PACK) ×2 IMPLANT
SLEEVE SCD COMPRESS KNEE MED (MISCELLANEOUS) ×3 IMPLANT
STOCKINETTE 6  STRL (DRAPES) ×2
STOCKINETTE 6 STRL (DRAPES) ×1 IMPLANT
STOCKINETTE SYNTHETIC 4 NONSTR (MISCELLANEOUS) ×1 IMPLANT
SUT ETHILON 3 0 PS 1 (SUTURE) ×4 IMPLANT
SUT ETHILON 4 0 PS 2 18 (SUTURE) IMPLANT
SUT MNCRL AB 3-0 PS2 18 (SUTURE) IMPLANT
SUT MNCRL AB 4-0 PS2 18 (SUTURE) IMPLANT
SUT MON AB 5-0 PS2 18 (SUTURE) IMPLANT
SUT VIC AB 3-0 FS2 27 (SUTURE) ×1 IMPLANT
SUT VICRYL 4-0 PS2 18IN ABS (SUTURE) ×1 IMPLANT
SYR BULB EAR ULCER 3OZ GRN STR (SYRINGE) ×3 IMPLANT
SYR CONTROL 10ML LL (SYRINGE) ×3 IMPLANT
TOWEL GREEN STERILE FF (TOWEL DISPOSABLE) ×3 IMPLANT
UNDERPAD 30X36 HEAVY ABSORB (UNDERPADS AND DIAPERS) ×3 IMPLANT
YANKAUER SUCT BULB TIP NO VENT (SUCTIONS) ×3 IMPLANT

## 2020-01-08 NOTE — Anesthesia Preprocedure Evaluation (Signed)
Anesthesia Evaluation  Patient identified by MRN, date of birth, ID band Patient awake    Reviewed: Allergy & Precautions, H&P , NPO status , Patient's Chart, lab work & pertinent test results  History of Anesthesia Complications (+) PONV and history of anesthetic complications  Airway Mallampati: II   Neck ROM: full    Dental   Pulmonary former smoker,    breath sounds clear to auscultation       Cardiovascular hypertension,  Rhythm:regular Rate:Normal     Neuro/Psych PSYCHIATRIC DISORDERS Depression    GI/Hepatic GERD  ,  Endo/Other  diabetes, Type 2  Renal/GU Renal InsufficiencyRenal disease     Musculoskeletal   Abdominal   Peds  Hematology  (+) Blood dyscrasia, anemia ,   Anesthesia Other Findings   Reproductive/Obstetrics                             Anesthesia Physical  Anesthesia Plan  ASA: III  Anesthesia Plan: MAC   Post-op Pain Management:    Induction: Intravenous  PONV Risk Score and Plan: 2 and Ondansetron, Propofol infusion, Midazolam and Treatment may vary due to age or medical condition  Airway Management Planned: Simple Face Mask  Additional Equipment:   Intra-op Plan:   Post-operative Plan:   Informed Consent: I have reviewed the patients History and Physical, chart, labs and discussed the procedure including the risks, benefits and alternatives for the proposed anesthesia with the patient or authorized representative who has indicated his/her understanding and acceptance.       Plan Discussed with: CRNA, Anesthesiologist and Surgeon  Anesthesia Plan Comments:         Anesthesia Quick Evaluation

## 2020-01-08 NOTE — Interval H&P Note (Signed)
History and Physical Interval Note:  01/08/2020 7:19 AM  Jorge Higgins  has presented today for surgery, with the diagnosis of DIABETIC  ULCER RIGHT FOOT,OSTEOMYLITIS.  The various methods of treatment have been discussed with the patient and family. After consideration of risks, benefits and other options for treatment, the patient has consented to  Procedure(s) with comments: IRRIGATION AND DEBRIDEMENT WOUND RIGHT FOOT (Right) - WITH LOCAL BONE BIOPSY (Right) as a surgical intervention.  The patient's history has been reviewed, patient examined, no change in status, stable for surgery.  I have reviewed the patient's chart and labs.  Questions were answered to the patient's satisfaction.     [name]

## 2020-01-08 NOTE — Op Note (Signed)
°  Patient Name: Lukas Dong Nimmons DOB: Oct 06, 1975  MRN: [id number]   Date of Surgery: 01/08/2020  Surgeon: Dr. Hardie Pulley, DPM Assistants: None  Pre-operative Diagnosis:  Diabetic ulcer with necrosis of bone, right Post-operative Diagnosis:  same Procedures:  1) Debridement of right foot wound including bone, wound closure. Pathology/Specimens: ID Type Source Tests Collected by Time Destination  1 : RIGHT CALCANEOUS BONE FOR PERMANENT PATHOLOGY Tissue PATH Bone biopsy SURGICAL PATHOLOGY, AEROBIC/ANAEROBIC CULTURE (SURGICAL/DEEP WOUND) [name], DPM 01/08/2020 6073   A : RIGHT CALCANEOUS BONE FOR MICRO/CULTURES Tissue PATH Bone biopsy AEROBIC/ANAEROBIC CULTURE (SURGICAL/DEEP WOUND) [name], DPM 01/08/2020 0751    Anesthesia: MAC/local Hemostasis: * No tourniquets in log * Estimated Blood Loss: 5 mL Materials: * No implants in log * Medications: none Complications: none  Indications for Procedure:  This is a 44 y.o. male with a chronic right lower extremity wound, with recent worsening and concern for possible osteomyelitis. It was discussed he would benefit from wound debridement and bone biopsy. All risks, benefits, and alternatives of surgery discussed. No guarantees given   Procedure in Detail: Patient was identified in pre-operative holding area. Formal consent was signed and the right lower extremity was marked. Patient was brought back to the operating room. Anesthesia was induced. The extremity was prepped and draped in the usual sterile fashion. Timeout was taken to confirm patient name, laterality, and procedure prior to incision.   Attention was then directed to the right foot. There was a wound measuring 2.5x2. The wound was sharply debrided with a 15 blade and curette. Excisional debridement was performed. The wound was further debrided with a rongeur. The wound was then copiously irrigated with NS via pulse lavage. At this point deeper debridement was  performed including curettage of the bone which was also excisionally debrided. Bone samples were taken with a rongeur for microbiology and pathology. The wound was irrigated again with pulse lavage. The wound at this point measured 4x2. The wound was then closed with 3-0 nylon and skin staples.  The foot was then dressed with betadine, 4x4, kerlix, ACE. Patient tolerated the procedure well.   Disposition: Following a period of post-operative monitoring, patient will be transferred home.

## 2020-01-08 NOTE — Transfer of Care (Signed)
Immediate Anesthesia Transfer of Care Note  Patient: Jorge Higgins  Procedure(s) Performed: IRRIGATION AND DEBRIDEMENT RIGHT FOOT WOUND (Right Foot) RIGHT FOOT BONE BIOPSY (Right Foot)  Patient Location: PACU  Anesthesia Type:MAC  Level of Consciousness: awake, oriented and patient cooperative  Airway & Oxygen Therapy: Patient Spontanous Breathing and Patient connected to face mask oxygen  Post-op Assessment: Report given to RN, Post -op Vital signs reviewed and stable, Patient moving all extremities X 4 and Patient able to stick tongue midline  Post vital signs: Reviewed and stable  Last Vitals:  Vitals Value Taken Time  BP    Temp    Pulse    Resp    SpO2      Last Pain:  Vitals:   01/08/20 0647  TempSrc: Oral  PainSc: 6       Patients Stated Pain Goal: 4 (29/47/65 4650)  Complications: No complications documented.

## 2020-01-08 NOTE — Anesthesia Postprocedure Evaluation (Signed)
Anesthesia Post Note  Patient: Jorge Higgins  Procedure(s) Performed: IRRIGATION AND DEBRIDEMENT RIGHT FOOT WOUND (Right Foot) RIGHT FOOT BONE BIOPSY (Right Foot)     Patient location during evaluation: PACU Anesthesia Type: MAC Level of consciousness: awake and alert Pain management: pain level controlled Vital Signs Assessment: post-procedure vital signs reviewed and stable Respiratory status: spontaneous breathing, nonlabored ventilation and respiratory function stable Cardiovascular status: blood pressure returned to baseline and stable Postop Assessment: no apparent nausea or vomiting Anesthetic complications: no   No complications documented.  Last Vitals:  Vitals:   01/08/20 0900 01/08/20 0901  BP: 121/85   Pulse: 79 78  Resp: (!) 9 16  Temp:    SpO2: 97% 97%    Last Pain:  Vitals:   01/08/20 0900  TempSrc:   PainSc: 0-No pain                 Lynda Rainwater

## 2020-01-08 NOTE — Discharge Instructions (Signed)
  After Surgery Instructions   1) If you are recuperating from surgery anywhere other than home, please be sure to leave [place] the number where you can be reached.  2) Go directly home and rest.  3) Keep the operated foot(feet) elevated six inches above the hip when sitting or lying down. This will help control swelling and pain.  4) Support the elevated foot and leg with pillows. DO NOT PLACE PILLOWS UNDER THE KNEE.  5) DO NOT REMOVE or get your bandages WET, unless you were given different instructions by your doctor to do so. This increases the risk of infection.  6) Wear your surgical shoe or surgical boot at all times when you are up on your feet.  7) A limited amount of pain and swelling may occur. The skin may take on a bruised appearance. DO NOT BE ALARMED, THIS IS NORMAL.  8) For slight pain and swelling, apply an ice pack directly over the bandages for 15 minutes only out of each hour of the day. Continue until seen in the office for your first post op visit. DO NOT APPLY ANY FORM OF HEAT TO THE AREA.  9) Have prescriptions filled immediately and take as directed.  10) Drink lots of liquids, water and juice to stay hydrated.  11) CALL IMMEDIATELY IF:  *Bleeding continues until the following day of surgery  *Pain increases and/or does not respond to medication  *Bandages or cast appears to tight  *If your bandage gets wet  *Trip, fall or stump your surgical foot  *If your temperature goes above 101  *If you have ANY questions at all  12) You are expected to be non-weightbearing after your surgery.   If you need to reach the nurse for any reason, please call: [place]: [phone] [place]: [phone] [place]: [phone]   Post Anesthesia Home Care Instructions  Activity: Get plenty of rest for the remainder of the day. A responsible individual must stay with you for 24 hours following the procedure.  For the next 24 hours, DO NOT: -Drive  a car -Paediatric nurse -Drink alcoholic beverages -Take any medication unless instructed by your physician -Make any legal decisions or sign important papers.  Meals: Start with liquid foods such as gelatin or soup. Progress to regular foods as tolerated. Avoid greasy, spicy, heavy foods. If nausea and/or vomiting occur, drink only clear liquids until the nausea and/or vomiting subsides. Call your physician if vomiting continues.  Special Instructions/Symptoms: Your throat may feel dry or sore from the anesthesia or the breathing tube placed in your throat during surgery. If this causes discomfort, gargle with warm salt water. The discomfort should disappear within 24 hours.  If you had a scopolamine patch placed behind your ear for the management of post- operative nausea and/or vomiting:  1. The medication in the patch is effective for 72 hours, after which it should be removed.  Wrap patch in a tissue and discard in the trash. Wash hands thoroughly with soap and water. 2. You may remove the patch earlier than 72 hours if you experience unpleasant side effects which may include dry mouth, dizziness or visual disturbances. 3. Avoid touching the patch. Wash your hands with soap and water after contact with the patch.

## 2020-01-08 NOTE — H&P (Signed)
Anesthesia H&P Update: History and Physical Exam reviewed; patient is OK for planned anesthetic and procedure. ? ?

## 2020-01-09 ENCOUNTER — Encounter (HOSPITAL_BASED_OUTPATIENT_CLINIC_OR_DEPARTMENT_OTHER): Payer: Self-pay | Admitting: Podiatry

## 2020-01-09 LAB — SURGICAL PATHOLOGY

## 2020-01-12 ENCOUNTER — Encounter: Payer: Self-pay | Admitting: Podiatry

## 2020-01-12 ENCOUNTER — Other Ambulatory Visit: Payer: Self-pay

## 2020-01-12 ENCOUNTER — Ambulatory Visit (INDEPENDENT_AMBULATORY_CARE_PROVIDER_SITE_OTHER): Payer: Medicaid Other | Admitting: Podiatry

## 2020-01-12 DIAGNOSIS — E13621 Other specified diabetes mellitus with foot ulcer: Secondary | ICD-10-CM

## 2020-01-12 DIAGNOSIS — L97514 Non-pressure chronic ulcer of other part of right foot with necrosis of bone: Secondary | ICD-10-CM

## 2020-01-12 NOTE — Progress Notes (Signed)
  Subjective:  Patient ID: Jorge Higgins, male    DOB: 22-Nov-1975,  MRN: [id number]  Chief Complaint  Patient presents with  . Routine Post Op    i had surgery on the right foot and i am doing ok    DOS: 10/24/19 Procedure: Right foot wound closure   44 y.o. male presents with the above complaint. States he can wear the boot without pain, however the wound is worse and has been bleeding.  Objective:  Physical Exam: no tenderness at the surgical site, no edema noted and calf supple, nontender. Incision: wound worsened since last visit, no significant erythema, slight clear drainage present. 3.5x3 that probes to bone Varus deformity present, chronic Assessment:   1. 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  Post-operative State -Wound well appearing. -Discussed discordant results of culture and pathology. I do think that he has an early infection in the bone. Culture sensitive to Cipro, made call to Dr. Alfredia Ferguson his nephrologist to ensure she is ok with starting renally dosed ciprofloxacin. -Refer to ID for antibiotic recommendations. May benefit from IV Abx -Wound dressed with maxorb, DSD. Patient to pack gauze into the wound daily after cleansing wound. -F/u in 1 week for recheck.

## 2020-01-13 ENCOUNTER — Other Ambulatory Visit: Payer: Self-pay | Admitting: Podiatry

## 2020-01-13 ENCOUNTER — Encounter: Payer: Self-pay | Admitting: Podiatry

## 2020-01-13 LAB — AEROBIC/ANAEROBIC CULTURE W GRAM STAIN (SURGICAL/DEEP WOUND)

## 2020-01-13 MED ORDER — CIPROFLOXACIN HCL 500 MG PO TABS: 500.0000 mg | ORAL_TABLET | Freq: Every day | ORAL | 0 refills | Status: DC

## 2020-01-13 NOTE — Progress Notes (Signed)
Rx sent to pharmacy. Sent message to patient

## 2020-01-14 ENCOUNTER — Ambulatory Visit (INDEPENDENT_AMBULATORY_CARE_PROVIDER_SITE_OTHER): Payer: Medicaid Other | Admitting: Internal Medicine

## 2020-01-14 ENCOUNTER — Other Ambulatory Visit: Payer: Self-pay

## 2020-01-14 ENCOUNTER — Encounter: Payer: Self-pay | Admitting: Internal Medicine

## 2020-01-14 VITALS — BP 138/88 | HR 94 | Temp 97.9°F | Wt 198.0 lb

## 2020-01-14 DIAGNOSIS — Z791 Long term (current) use of non-steroidal anti-inflammatories (NSAID): Secondary | ICD-10-CM

## 2020-01-14 DIAGNOSIS — M86171 Other acute osteomyelitis, right ankle and foot: Secondary | ICD-10-CM | POA: Diagnosis present

## 2020-01-14 DIAGNOSIS — N1832 Chronic kidney disease, stage 3b: Secondary | ICD-10-CM

## 2020-01-14 DIAGNOSIS — E1169 Type 2 diabetes mellitus with other specified complication: Secondary | ICD-10-CM | POA: Diagnosis not present

## 2020-01-14 NOTE — Assessment & Plan Note (Signed)
Post-debridement bone cultures with growth of PsA sensitive to cipro (as well as other IV options).  Despite the benign pathology results, I agree with Dr March Rummage that this warrants antibiotics to treat for osteomyelitis with residual bone infection after debridement given the risk his limb faces.  Per Dr Eleanora Neighbor last note the open area also probes to bone.  Cipro has excellent bioavailability and is comparable to IV.  Discussed with patient today that we could attempt treatment with cipro or IV therapy which would require a tunneled central line given his CKD and likely future need for HD.  Patient prefers to attempt cipro.  Discussed prior reaction that may have been due to levaquin.  Patient will monitor his symptoms closely after starting the medication.  Plan: -- Ciprofloxacin renally dosed (prescribed as 524m daily) -- Check ESR/CRP, CBC, CMP today -- Discussed in detail side effects of Ciprofloxacin -- If does not tolerate, will need central line for IV abx -- RTC 4 weeks, sooner if needed

## 2020-01-14 NOTE — Progress Notes (Signed)
[facility]  Reason for Consult: Osteomyelitis Referring Provider: Dr. March Rummage  HPI:  Jorge Higgins is a 44 y.o. male who presents to clinic for evaluation of diabetic foot ulcer with osteomyelitis.     Pt presenting to ID clinic today.  He had surgery on his right foot with debridement and wound closure 01/08/20.  Pathology from this did not reveal any evidence of osteomyelitis but bone cultures obtained grew Pseudomonas.  His podiatrist is concerned regarding an early infection in the bone based on the discordant results and overall presentation.  He was prescribed cipro renal dosed but has yet to pick this up from his pharmacy.  He does have an allergy to levaquin in which he reports previously taking and having fast heart rate and feeling nauseous but this occurred while concurrently taking amoxicillin and another medication he cannot recall. He is agreeable to a trial of cipro to avoid a central line for IV abx.  Per last podiatry note: "wound worsened since last visit, no significant erythema, slight clear drainage present. 3.5x3 that probes to bone."  Patient today feels that there is some improvement.   Patient's Medications  New Prescriptions   No medications on file  Previous Medications   ALBUTEROL (VENTOLIN HFA) 108 (90 BASE) MCG/ACT INHALER    Inhale into the lungs every 4 (four) hours as needed.    AMLODIPINE (NORVASC) 10 MG TABLET    Take 10 mg by mouth daily.   ASCORBIC ACID (VITAMIN C) 500 MG CAPS    Take by mouth daily.   ASPIRIN 81 MG EC TABLET    Take by mouth.   BD PEN NEEDLE NANO 2ND GEN 32G X 4 MM MISC    USE TO INJECT DAILY LANTUS SOLOSTAR DOSE AND THREE TIMES DAILY HUMALOG KWIKPEN DOSE   CIPROFLOXACIN (CIPRO) 500 MG TABLET    Take 1 tablet (500 mg total) by mouth daily with breakfast.   CLINDAMYCIN (CLEOCIN) 300 MG CAPSULE    Take 1 capsule (300 mg total) by mouth 3 (three) times daily for 10 days.   CYANOCOBALAMIN (B-12) 2500  MCG TABS    Take by mouth daily.   ERGOCALCIFEROL 50 MCG (2000 UT) TABS    Take by mouth.   FAMOTIDINE (PEPCID) 20 MG TABLET    Take 20 mg by mouth 2 (two) times daily.   FERROUS SULFATE 325 (65 FE) MG EC TABLET    Take 325 mg by mouth daily.   GLIPIZIDE (GLUCOTROL) 5 MG TABLET    Take 2.5 mg by mouth daily.    INSULIN GLARGINE (LANTUS) 100 UNIT/ML SOLOSTAR PEN    Inject 5 Units into the skin at bedtime.    INSULIN LISPRO (HUMALOG) 100 UNIT/ML KWIKPEN    Inject into the skin.   LOSARTAN (COZAAR) 25 MG TABLET    Take 25 mg by mouth daily.   OMEGA-3 1000 MG CAPS    Take by mouth daily.   ONDANSETRON (ZOFRAN) 4 MG TABLET    Take 1 tablet (4 mg total) by mouth every 8 (eight) hours as needed for nausea or vomiting.   ONDANSETRON (ZOFRAN-ODT) 4 MG DISINTEGRATING TABLET    Take 4 mg by mouth every 8 (eight) hours as needed.   OXYCODONE-ACETAMINOPHEN (PERCOCET) 5-325 MG TABLET    Take 1 tablet by mouth every 4 (four) hours as needed for severe pain.   SERTRALINE (ZOLOFT) 50 MG TABLET    Take 50 mg  by mouth daily.   SILVER SULFADIAZINE (SILVADENE) 1 % CREAM    Apply pea-sized amount to wound every other day.  Modified Medications   No medications on file  Discontinued Medications   No medications on file      Past Medical History:  Diagnosis Date  . Anemia of renal disease   . Chronic osteomyelitis (HCC)    s/p  toes amputation's  . CKD (chronic kidney disease), stage III ([place])    --- dr hida sheikh  . Complication of anesthesia   . Depression   . Diabetic foot ulcer ([place])    right   . Diabetic neuropathy ([place])   . ED (erectile dysfunction)   . GERD (gastroesophageal reflux disease)   . History of syncope    03/ 2021  near syncope episode (admission at [facility])  hyperkalemia and orthostatic hypotension  (07-24-2019  per pt was side effects of taking levaquin and has ckd)  . Hyperlipidemia   . Hypertension    followed by pcp   (07-24-2019 per pt never had a stress test)  .  PONV (postoperative nausea and vomiting)   . Retinopathy   . Type 2 diabetes mellitus treated with insulin ([place])    followed by pcp   (10-21-2019 pt stated checks blood sugar TID,  fasting sugar-- 110    Social History   Tobacco Use  . Smoking status: Former Smoker    Years: 6.00    Types: Cigarettes    Quit date: 06/24/2013    Years since quitting: 6.5  . Smokeless tobacco: Never Used  Vaping Use  . Vaping Use: Never used  Substance Use Topics  . Alcohol use: Never  . Drug use: Never    Family History  Problem Relation Age of Onset  . Diabetes Mellitus II Mother   . Hypertension Mother   . Diabetes Mellitus II Sister   . Alzheimer's disease Father   . Diabetes Maternal Grandmother   . Hypertension Maternal Grandmother   . Diabetes Maternal Grandfather   . Hypertension Maternal Grandfather   . Diabetes Paternal Grandmother   . Hypertension Paternal Grandmother   . Diabetes Paternal Grandfather   . Hypertension Paternal Grandfather     Allergies  Allergen Reactions  . Azithromycin Nausea And Vomiting, Shortness Of Breath and Other (See Comments)  . Levaquin [Levofloxacin]     Dizziness, nausea, weakness, heart races  . Lipitor [Atorvastatin] Other (See Comments)    Dizziness, heart races, weakness  . Metoprolol Other (See Comments)    Elevates Potassium too much  . Hydromorphone Nausea And Vomiting  . Nickel Rash    Review of Systems: Review of Systems  Constitutional: Negative for chills and fever.  Respiratory: Negative for cough and shortness of breath.   Cardiovascular: Negative for chest pain.  Musculoskeletal: Positive for joint pain.  Neurological: Negative.   Psychiatric/Behavioral: Negative.   All other systems reviewed and are negative.   Objective: Vitals:   01/14/20 0922  BP: 138/88  Pulse: 94  Temp: 97.9 F (36.6 C)  TempSrc: Oral  Weight: 198 lb (89.8 kg)     Body mass index is 29.24 kg/m.  Physical Exam Constitutional:       Appearance: Normal appearance.  HENT:     Head: Normocephalic and atraumatic.  Pulmonary:     Effort: Pulmonary effort is normal. No respiratory distress.  Musculoskeletal:        General: Deformity present.     Comments: Varus deformity noted. No  significant erythema at incision site.  Clear drainage noted.  Small open area noted.  Patient with decreased sensation of foot.   Skin:    General: Skin is warm and dry.  Neurological:     General: No focal deficit present.     Mental Status: He is alert and oriented to person, place, and time.      Pertinent Labs and Microbiology:   CBC Latest Ref Rng & Units 12/25/2019 10/24/2019 05/30/2019  WBC 3.8 - 10.8 Thousand/uL 9.3 - 10.1  Hemoglobin 13.2 - 17.1 g/dL 12.6(L) 12.2(L) 9.6(L)  Hematocrit 38 - 50 % 38.5 36.0(L) 28.5(L)  Platelets 140 - 400 Thousand/uL 531(H) - 544(H)   CMP Latest Ref Rng & Units 12/25/2019 10/24/2019 06/25/2019  Glucose 65 - 99 mg/dL 149(H) 207(H) 235(H)  BUN 7 - 25 mg/dL 30(H) 27(H) 21  Creatinine 0.60 - 1.35 mg/dL 2.97(H) 2.90(H) 1.62(H)  Sodium 135 - 146 mmol/L 135 141 133(L)  Potassium 3.5 - 5.3 mmol/L 4.7 3.8 4.9  Chloride 98 - 110 mmol/L 104 104 100  CO2 20 - 32 mmol/L 25 - 22  Calcium 8.6 - 10.3 mg/dL 8.7 - 9.4  Total Protein 6.5 - 8.1 g/dL - - -  Total Bilirubin 0.3 - 1.2 mg/dL - - -  Alkaline Phos 38 - 126 U/L - - -  AST 15 - 41 U/L - - -  ALT 17 - 63 U/L - - -     Recent Results (from the past 240 hour(s))  SARS CORONAVIRUS 2 (TAT 6-24 HRS) Nasopharyngeal Nasopharyngeal Swab     Status: None   Collection Time: 01/05/20  9:01 AM   Specimen: Nasopharyngeal Swab  Result Value Ref Range Status   SARS Coronavirus 2 NEGATIVE NEGATIVE Final    Comment: (NOTE) SARS-CoV-2 target nucleic acids are NOT DETECTED.  The SARS-CoV-2 RNA is generally detectable in upper and lower respiratory specimens during the acute phase of infection. Negative results do not preclude SARS-CoV-2 infection, do not rule  out co-infections with other pathogens, and should not be used as the sole basis for treatment or other patient management decisions. Negative results must be combined with clinical observations, patient history, and epidemiological information. The expected result is Negative.  Fact Sheet for Patients: SugarRoll.be  Fact Sheet for Healthcare Providers: https://www.woods-mathews.com/  This test is not yet approved or cleared by the [place] FDA and  has been authorized for detection and/or diagnosis of SARS-CoV-2 by FDA under an Emergency Use Authorization (EUA). This EUA will remain  in effect (meaning this test can be used) for the duration of the COVID-19 declaration under Se ction 564(b)(1) of the Act, 21 U.S.C. section 360bbb-3(b)(1), unless the authorization is terminated or revoked sooner.  Performed at [facility], [place] [address], [place], [place] [address]   Aerobic/Anaerobic Culture (surgical/deep wound)     Status: None   Collection Time: 01/08/20  7:51 AM   Specimen: PATH Bone biopsy; Tissue  Result Value Ref Range Status   Specimen Description TISSUE  Final   Special Requests RIGHT CALCANEOUS BONE SPEC A  Final   Gram Stain   Final    RARE WBC PRESENT,BOTH PMN AND MONONUCLEAR NO ORGANISMS SEEN Performed at [facility], [address], [place], [place] [address]    Culture   Final    FEW PSEUDOMONAS AERUGINOSA NO ANAEROBES ISOLATED; CULTURE IN PROGRESS FOR 5 DAYS    Report Status 01/13/2020 FINAL  Final   Organism ID, Bacteria  PSEUDOMONAS AERUGINOSA  Final      Susceptibility   Pseudomonas aeruginosa - MIC*    CEFTAZIDIME <=1 SENSITIVE Sensitive     CIPROFLOXACIN 1 SENSITIVE Sensitive     GENTAMICIN <=1 SENSITIVE Sensitive     IMIPENEM >=16 RESISTANT Resistant     PIP/TAZO <=4 SENSITIVE Sensitive     CEFEPIME 2 SENSITIVE Sensitive     * FEW PSEUDOMONAS AERUGINOSA    Imaging: IMPRESSION: 1.  Severe chronic foot and ankle deformities with severe hindfoot varus. 2. Open wound lateral foot wound. No discrete drainable abscess but there is significant surrounding inflammation/edema/edema probable granulation tissue. 3. Findings highly suggestive of osteomyelitis involving the calcaneus and possibly the distal talus. 4. Tibiotalar, subtalar and talonavicular joint effusions and cartilage degeneration. Could not exclude septic arthritis. 5. Diffuse cellulitis and myositis without obvious findings for pyomyositis. 6. Evidence of prior cuboid and fifth metatarsal resection.    Assessment and Plan: Acute osteomyelitis ([place]) Post-debridement bone cultures with growth of PsA sensitive to cipro (as well as other IV options).  Despite the benign pathology results, I agree with Dr March Rummage that this warrants antibiotics to treat for osteomyelitis with residual bone infection after debridement given the risk his limb faces.  Per Dr Eleanora Neighbor last note the open area also probes to bone.  Cipro has excellent bioavailability and is comparable to IV.  Discussed with patient today that we could attempt treatment with cipro or IV therapy which would require a tunneled central line given his CKD and likely future need for HD.  Patient prefers to attempt cipro.  Discussed prior reaction that may have been due to levaquin.  Patient will monitor his symptoms closely after starting the medication.  Plan: -- Ciprofloxacin renally dosed (prescribed as 571m daily) -- Check ESR/CRP, CBC, CMP today -- Discussed in detail side effects of Ciprofloxacin -- If does not tolerate, will need central line for IV abx -- RTC 4 weeks, sooner if needed   Other issues: 1. Stage 3b chronic kidney disease ([place] Check labs today.  Cipro dosed renally  2. Type 2 diabetes mellitus with other specified complication, unspecified whether long term insulin use (HCC) Contributing to overall issues  3. Encounter for long-term  use of non-steroidal anti-inflammatory medication Check baseline labs today.  EKG with normal QTc from this year.    Orders Placed This Encounter  Procedures  . Sedimentation rate  . C-reactive protein  . CBC w/Diff  . COMPLETE METABOLIC PANEL WITH GFR        [facility] 01/14/2020, 10:07 AM

## 2020-01-14 NOTE — Patient Instructions (Signed)
Thank you for coming to see me today. It was a pleasure.   Today we talked about:  -- please pick up the antibiotic that your podiatrist prescribed . This will be to treat the infection in your foot. -- please let us know if any issues after starting the antibiotic   Please follow-up with me in 4 weeks.   If you have any questions or concerns, please do not hesitate to call the office at [phone].  Take Care,   Jule Ser, DO

## 2020-01-15 LAB — CBC WITH DIFFERENTIAL/PLATELET
Absolute Monocytes: 561 cells/uL (ref 200–950)
Basophils Absolute: 51 cells/uL (ref 0–200)
Basophils Relative: 0.6 %
Eosinophils Absolute: 383 cells/uL (ref 15–500)
Eosinophils Relative: 4.5 %
HCT: 35.9 % — ABNORMAL LOW (ref 38.5–50.0)
Hemoglobin: 11.9 g/dL — ABNORMAL LOW (ref 13.2–17.1)
Lymphs Abs: 1913 cells/uL (ref 850–3900)
MCH: 29.1 pg (ref 27.0–33.0)
MCHC: 33.1 g/dL (ref 32.0–36.0)
MCV: 87.8 fL (ref 80.0–100.0)
MPV: 9.8 fL (ref 7.5–12.5)
Monocytes Relative: 6.6 %
Neutro Abs: 5593 cells/uL (ref 1500–7800)
Neutrophils Relative %: 65.8 %
Platelets: 485 10*3/uL — ABNORMAL HIGH (ref 140–400)
RBC: 4.09 10*6/uL — ABNORMAL LOW (ref 4.20–5.80)
RDW: 12.6 % (ref 11.0–15.0)
Total Lymphocyte: 22.5 %
WBC: 8.5 10*3/uL (ref 3.8–10.8)

## 2020-01-15 LAB — C-REACTIVE PROTEIN: CRP: 9.7 mg/L — ABNORMAL HIGH (ref <8.0)

## 2020-01-15 LAB — COMPLETE METABOLIC PANEL WITH GFR
AG Ratio: 0.9 (calc) — ABNORMAL LOW (ref 1.0–2.5)
ALT: 8 U/L — ABNORMAL LOW (ref 9–46)
AST: 8 U/L — ABNORMAL LOW (ref 10–40)
Albumin: 2.7 g/dL — ABNORMAL LOW (ref 3.6–5.1)
Alkaline phosphatase (APISO): 74 U/L (ref 36–130)
BUN/Creatinine Ratio: 11 (calc) (ref 6–22)
BUN: 33 mg/dL — ABNORMAL HIGH (ref 7–25)
CO2: 24 mmol/L (ref 20–32)
Calcium: 8.4 mg/dL — ABNORMAL LOW (ref 8.6–10.3)
Chloride: 103 mmol/L (ref 98–110)
Creat: 3.03 mg/dL — ABNORMAL HIGH (ref 0.60–1.35)
GFR, Est African American: 27 mL/min/{1.73_m2} — ABNORMAL LOW (ref >=60)
GFR, Est Non African American: 24 mL/min/{1.73_m2} — ABNORMAL LOW (ref >=60)
Globulin: 3 g/dL (calc) (ref 1.9–3.7)
Glucose, Bld: 264 mg/dL — ABNORMAL HIGH (ref 65–99)
Potassium: 4.8 mmol/L (ref 3.5–5.3)
Sodium: 133 mmol/L — ABNORMAL LOW (ref 135–146)
Total Bilirubin: 0.2 mg/dL (ref 0.2–1.2)
Total Protein: 5.7 g/dL — ABNORMAL LOW (ref 6.1–8.1)

## 2020-01-15 LAB — SEDIMENTATION RATE: Sed Rate: >130 mm/h — ABNORMAL HIGH (ref 0–15)

## 2020-01-20 ENCOUNTER — Ambulatory Visit (INDEPENDENT_AMBULATORY_CARE_PROVIDER_SITE_OTHER): Payer: Medicaid Other | Admitting: Sports Medicine

## 2020-01-20 ENCOUNTER — Other Ambulatory Visit: Payer: Self-pay

## 2020-01-20 ENCOUNTER — Encounter: Payer: Self-pay | Admitting: Sports Medicine

## 2020-01-20 DIAGNOSIS — M86671 Other chronic osteomyelitis, right ankle and foot: Secondary | ICD-10-CM

## 2020-01-20 DIAGNOSIS — E13621 Other specified diabetes mellitus with foot ulcer: Secondary | ICD-10-CM

## 2020-01-20 DIAGNOSIS — Z9889 Other specified postprocedural states: Secondary | ICD-10-CM

## 2020-01-20 DIAGNOSIS — L97514 Non-pressure chronic ulcer of other part of right foot with necrosis of bone: Secondary | ICD-10-CM

## 2020-01-20 DIAGNOSIS — M21171 Varus deformity, not elsewhere classified, right ankle: Secondary | ICD-10-CM | POA: Diagnosis not present

## 2020-01-20 DIAGNOSIS — M79671 Pain in right foot: Secondary | ICD-10-CM

## 2020-01-20 NOTE — Progress Notes (Signed)
Subjective: Jorge Higgins is a 44 y.o. male patient seen in office for evaluation of ulceration of the right lateral foot, patient is s/p right foot wound closure performed on 10/24/19 by Dr. March Rummage.  Patient report that he has been changing dressing each day and taking Clindamycin and Cipro with no issues.  Patient is changing the dressing but feels like its taking a long time to heal. Reports that he is dealing with Kidney problems. Denies nausea/fever/vomiting/chills/night sweats/shortness of breath/pain. Patient has no other pedal complaints at this time.  FBS not recorded.  Patient Active Problem List   Diagnosis Date Noted  . Diabetic foot ulcer ([place])   . Ulcerated, foot, right, with fat layer exposed ([place])   . Stage 3b chronic kidney disease ([place]) 10/20/2019  . Acute osteomyelitis ([place])   . Wound dehiscence   . Orthostatic hypotension 07/16/2019  . Dysautonomia ([place]) 07/16/2019  . Mixed diabetic hyperlipidemia associated with type 2 diabetes mellitus ([place]) 03/31/2019  . Open wound of left foot 03/31/2019  . ED (erectile dysfunction) 10/06/2016  . Osteomyelitis ([place]) 09/19/2016  . Leukocytosis 09/19/2016  . Chronic osteomyelitis of left foot ([place]) 08/17/2016  . Osteomyelitis of left foot ([place]) 08/17/2016  . Hypertension   . Type 2 diabetes mellitus with other specified complication ([place])   . Toe amputation status 12/15/2015  . Cellulitis 10/14/2015  . Diabetic ulcer of left foot associated with type 2 diabetes mellitus ([place]) 10/06/2015  . Refused pneumococcal vaccine 07/10/2013   Current Outpatient Medications on File Prior to Visit  Medication Sig Dispense Refill  . albuterol (VENTOLIN HFA) 108 (90 Base) MCG/ACT inhaler Inhale into the lungs every 4 (four) hours as needed.     Marland Kitchen amLODipine (NORVASC) 10 MG tablet Take 10 mg by mouth daily.    . Ascorbic Acid (VITAMIN C) 500 MG CAPS Take by mouth daily.    Marland Kitchen aspirin 81 MG EC tablet Take by mouth.    . BD PEN NEEDLE NANO  2ND GEN 32G X 4 MM MISC USE TO INJECT DAILY LANTUS SOLOSTAR DOSE AND THREE TIMES DAILY HUMALOG KWIKPEN DOSE    . ciprofloxacin (CIPRO) 500 MG tablet Take 1 tablet (500 mg total) by mouth daily with breakfast. (Patient not taking: Reported on 01/14/2020) 7 tablet 0  . Cyanocobalamin (B-12) 2500 MCG TABS Take by mouth daily.    . Ergocalciferol 50 MCG (2000 UT) TABS Take by mouth.    . famotidine (PEPCID) 20 MG tablet Take 20 mg by mouth 2 (two) times daily.    . ferrous sulfate 325 (65 FE) MG EC tablet Take 325 mg by mouth daily.    Marland Kitchen glipiZIDE (GLUCOTROL) 5 MG tablet Take 2.5 mg by mouth daily.     . insulin glargine (LANTUS) 100 UNIT/ML Solostar Pen Inject 5 Units into the skin at bedtime.     . insulin lispro (HUMALOG) 100 UNIT/ML KwikPen Inject into the skin.    Marland Kitchen losartan (COZAAR) 25 MG tablet Take 25 mg by mouth daily.    . Omega-3 1000 MG CAPS Take by mouth daily.    . ondansetron (ZOFRAN) 4 MG tablet Take 1 tablet (4 mg total) by mouth every 8 (eight) hours as needed for nausea or vomiting. 20 tablet 0  . ondansetron (ZOFRAN-ODT) 4 MG disintegrating tablet Take 4 mg by mouth every 8 (eight) hours as needed.    Marland Kitchen oxyCODONE-acetaminophen (PERCOCET) 5-325 MG tablet Take 1 tablet by mouth every 4 (four) hours as needed for severe pain.  20 tablet 0  . sertraline (ZOLOFT) 50 MG tablet Take 50 mg by mouth daily.    . silver sulfADIAZINE (SILVADENE) 1 % cream Apply pea-sized amount to wound every other day. 50 g 0   No current facility-administered medications on file prior to visit.   Allergies  Allergen Reactions  . Azithromycin Nausea And Vomiting, Shortness Of Breath and Other (See Comments)  . Levaquin [Levofloxacin]     Dizziness, nausea, weakness, heart races  . Lipitor [Atorvastatin] Other (See Comments)    Dizziness, heart races, weakness  . Metoprolol Other (See Comments)    Elevates Potassium too much  . Hydromorphone Nausea And Vomiting  . Nickel Rash    Recent Results  (from the past 2160 hour(s))  I-STAT, chem 8     Status: Abnormal   Collection Time: 10/24/19  1:56 PM  Result Value Ref Range   Sodium 141 135 - 145 mmol/L   Potassium 3.8 3.5 - 5.1 mmol/L   Chloride 104 98 - 111 mmol/L   BUN 27 (H) 6 - 20 mg/dL   Creatinine, Ser 2.90 (H) 0.61 - 1.24 mg/dL   Glucose, Bld 207 (H) 70 - 99 mg/dL    Comment: Glucose reference range applies only to samples taken after fasting for at least 8 hours.   Calcium, Ion 1.19 1.15 - 1.40 mmol/L   TCO2 21 (L) 22 - 32 mmol/L   Hemoglobin 12.2 (L) 13.0 - 17.0 g/dL   HCT 36.0 (L) 39 - 52 %  Glucose, capillary     Status: Abnormal   Collection Time: 10/24/19  3:36 PM  Result Value Ref Range   Glucose-Capillary 117 (H) 70 - 99 mg/dL    Comment: Glucose reference range applies only to samples taken after fasting for at least 8 hours.  Basic Metabolic Panel     Status: Abnormal   Collection Time: 12/25/19  1:42 PM  Result Value Ref Range   Glucose, Bld 149 (H) 65 - 99 mg/dL    Comment: .            Fasting reference interval . For someone without known diabetes, a glucose value >125 mg/dL indicates that they may have diabetes and this should be confirmed with a follow-up test. .    BUN 30 (H) 7 - 25 mg/dL   Creat 2.97 (H) 0.60 - 1.35 mg/dL   BUN/Creatinine Ratio 10 6 - 22 (calc)   Sodium 135 135 - 146 mmol/L   Potassium 4.7 3.5 - 5.3 mmol/L   Chloride 104 98 - 110 mmol/L   CO2 25 20 - 32 mmol/L   Calcium 8.7 8.6 - 10.3 mg/dL  CBC with Differential/Platelet     Status: Abnormal   Collection Time: 12/25/19  1:42 PM  Result Value Ref Range   WBC 9.3 3.8 - 10.8 Thousand/uL   RBC 4.40 4.20 - 5.80 Million/uL   Hemoglobin 12.6 (L) 13.2 - 17.1 g/dL   HCT 38.5 38 - 50 %   MCV 87.5 80.0 - 100.0 fL   MCH 28.6 27.0 - 33.0 pg   MCHC 32.7 32.0 - 36.0 g/dL   RDW 13.1 11.0 - 15.0 %   Platelets 531 (H) 140 - 400 Thousand/uL   MPV 9.8 7.5 - 12.5 fL   Neutro Abs 6,454 1,500 - 7,800 cells/uL   Lymphs Abs 1,953 850 -  3,900 cells/uL   Absolute Monocytes 446 200 - 950 cells/uL   Eosinophils Absolute 381 15.0 - 500.0 cells/uL  Basophils Absolute 65 0.0 - 200.0 cells/uL   Neutrophils Relative % 69.4 %   Total Lymphocyte 21.0 %   Monocytes Relative 4.8 %   Eosinophils Relative 4.1 %   Basophils Relative 0.7 %  Sedimentation Rate     Status: Abnormal   Collection Time: 12/25/19  1:42 PM  Result Value Ref Range   Sed Rate 126 (H) 0 - 15 mm/h  SARS CORONAVIRUS 2 (TAT 6-24 HRS) Nasopharyngeal Nasopharyngeal Swab     Status: None   Collection Time: 01/05/20  9:01 AM   Specimen: Nasopharyngeal Swab  Result Value Ref Range   SARS Coronavirus 2 NEGATIVE NEGATIVE    Comment: (NOTE) SARS-CoV-2 target nucleic acids are NOT DETECTED.  The SARS-CoV-2 RNA is generally detectable in upper and lower respiratory specimens during the acute phase of infection. Negative results do not preclude SARS-CoV-2 infection, do not rule out co-infections with other pathogens, and should not be used as the sole basis for treatment or other patient management decisions. Negative results must be combined with clinical observations, patient history, and epidemiological information. The expected result is Negative.  Fact Sheet for Patients: SugarRoll.be  Fact Sheet for Healthcare Providers: https://www.woods-mathews.com/  This test is not yet approved or cleared by the [place] FDA and  has been authorized for detection and/or diagnosis of SARS-CoV-2 by FDA under an Emergency Use Authorization (EUA). This EUA will remain  in effect (meaning this test can be used) for the duration of the COVID-19 declaration under Se ction 564(b)(1) of the Act, 21 U.S.C. section 360bbb-3(b)(1), unless the authorization is terminated or revoked sooner.  Performed at [facility], [place] [address], [place], [place] [address]   Glucose, capillary     Status: Abnormal   Collection Time:  01/08/20  7:01 AM  Result Value Ref Range   Glucose-Capillary 196 (H) 70 - 99 mg/dL    Comment: Glucose reference range applies only to samples taken after fasting for at least 8 hours.  Aerobic/Anaerobic Culture (surgical/deep wound)     Status: None   Collection Time: 01/08/20  7:51 AM   Specimen: PATH Bone biopsy; Tissue  Result Value Ref Range   Specimen Description TISSUE    Special Requests RIGHT CALCANEOUS BONE SPEC A    Gram Stain      RARE WBC PRESENT,BOTH PMN AND MONONUCLEAR NO ORGANISMS SEEN Performed at [facility], [address], [place], [place] [address]    Culture      FEW PSEUDOMONAS AERUGINOSA NO ANAEROBES ISOLATED; CULTURE IN PROGRESS FOR 5 DAYS    Report Status 01/13/2020 FINAL    Organism ID, Bacteria PSEUDOMONAS AERUGINOSA       Susceptibility   Pseudomonas aeruginosa - MIC*    CEFTAZIDIME <=1 SENSITIVE Sensitive     CIPROFLOXACIN 1 SENSITIVE Sensitive     GENTAMICIN <=1 SENSITIVE Sensitive     IMIPENEM >=16 RESISTANT Resistant     PIP/TAZO <=4 SENSITIVE Sensitive     CEFEPIME 2 SENSITIVE Sensitive     * FEW PSEUDOMONAS AERUGINOSA  Surgical pathology     Status: None   Collection Time: 01/08/20  7:52 AM  Result Value Ref Range   SURGICAL PATHOLOGY      SURGICAL PATHOLOGY CASE: [id number] PATIENT: Demetris Bezdek Surgical Pathology Report     Clinical History: diabetic ulcer right foot, osteomyelitis (cm)     FINAL MICROSCOPIC DIAGNOSIS:  A. BONE, RIGHT FOOT, BIOPSY: - Benign bone. - Soft tissue with mild inflammation. -  No acute osteomyelitis.   GROSS DESCRIPTION:  The specimen is received fresh, and consists of a 0.9 x 0.8 x 0.3 cm aggregate of tan-red bone.  Specimen is entirely submitted in 1 cassette following decalcification.  Craig Staggers 01/08/2020)    Final Diagnosis performed by Vicente Males, MD.   Electronically signed 01/09/2020 Technical component performed at [facility]. [facility], [place] [address]  [place], [place] [address].  Professional component performed at [facility], [place] [address] [place], [place] [address].  Immunohistochemistry Technical component (if applicable) was performed at [facility]. [address], [place], [place],   [address].   IMMUNOHISTOCHEMISTRY DISCLAIMER (if applicable): Some of these immunohistochemical stains may have been developed and the performance characteristics determine by [facility]. Some may not have been cleared or approved by the U.S. Food and Drug Administration. The FDA has determined that such clearance or approval is not necessary. This test is used for clinical purposes. It should not be regarded as investigational or for research. This laboratory is certified under the [place] (CLIA-88) as qualified to perform high complexity clinical laboratory testing.  The controls stained appropriately.   Glucose, capillary     Status: Abnormal   Collection Time: 01/08/20  8:15 AM  Result Value Ref Range   Glucose-Capillary 171 (H) 70 - 99 mg/dL    Comment: Glucose reference range applies only to samples taken after fasting for at least 8 hours.  Sedimentation rate     Status: Abnormal   Collection Time: 01/14/20  9:54 AM  Result Value Ref Range   Sed Rate >130 (H) 0 - 15 mm/h    Comment: Verified by repeat analysis. .   C-reactive protein     Status: Abnormal   Collection Time: 01/14/20  9:54 AM  Result Value Ref Range   CRP 9.7 (H) <8.0 mg/L  CBC w/Diff     Status: Abnormal   Collection Time: 01/14/20  9:54 AM  Result Value Ref Range   WBC 8.5 3.8 - 10.8 Thousand/uL   RBC 4.09 (L) 4.20 - 5.80 Million/uL   Hemoglobin 11.9 (L) 13.2 - 17.1 g/dL   HCT 35.9 (L) 38 - 50 %   MCV 87.8 80.0 - 100.0 fL   MCH 29.1 27.0 - 33.0 pg   MCHC 33.1 32.0 - 36.0 g/dL   RDW 12.6 11.0 - 15.0 %   Platelets 485 (H) 140 - 400 Thousand/uL   MPV 9.8 7.5 - 12.5  fL   Neutro Abs 5,593 1,500 - 7,800 cells/uL   Lymphs Abs 1,913 850 - 3,900 cells/uL   Absolute Monocytes 561 200 - 950 cells/uL   Eosinophils Absolute 383 15.0 - 500.0 cells/uL   Basophils Absolute 51 0.0 - 200.0 cells/uL   Neutrophils Relative % 65.8 %   Total Lymphocyte 22.5 %   Monocytes Relative 6.6 %   Eosinophils Relative 4.5 %   Basophils Relative 0.6 %  COMPLETE METABOLIC PANEL WITH GFR     Status: Abnormal   Collection Time: 01/14/20  9:54 AM  Result Value Ref Range   Glucose, Bld 264 (H) 65 - 99 mg/dL    Comment: .            Fasting reference interval . For someone without known diabetes, a glucose value >125 mg/dL indicates that they may have diabetes and this should be confirmed with a follow-up test. .    BUN 33 (H) 7 - 25  mg/dL   Creat 3.03 (H) 0.60 - 1.35 mg/dL   GFR, Est Non African American 24 (L) > OR = 60 mL/min/1.3m2   GFR, Est African American 27 (L) > OR = 60 mL/min/1.99m2   BUN/Creatinine Ratio 11 6 - 22 (calc)   Sodium 133 (L) 135 - 146 mmol/L   Potassium 4.8 3.5 - 5.3 mmol/L   Chloride 103 98 - 110 mmol/L   CO2 24 20 - 32 mmol/L   Calcium 8.4 (L) 8.6 - 10.3 mg/dL   Total Protein 5.7 (L) 6.1 - 8.1 g/dL   Albumin 2.7 (L) 3.6 - 5.1 g/dL   Globulin 3.0 1.9 - 3.7 g/dL (calc)   AG Ratio 0.9 (L) 1.0 - 2.5 (calc)   Total Bilirubin 0.2 0.2 - 1.2 mg/dL   Alkaline phosphatase (APISO) 74 36 - 130 U/L   AST 8 (L) 10 - 40 U/L   ALT 8 (L) 9 - 46 U/L    Objective: There were no vitals filed for this visit.  General: Patient is awake, alert, oriented x 3 and in no acute distress.  Dermatology: Skin is warm and dry with gaping noted to the right lateral foot incision with staples and retention sutures intact, wound at  2x1x1cm at proximal aspect, There is no malodor, clear to bloody active drainage, minimal erythema, minimal focal edema. No acute signs of infection.   Vascular: Dorsalis Pedis pulse = 1/4,  Posterior Tibial pulse =1/4.   Neurologic:  Protective sensation absent.   Musculosketal: Varus foot deformity on right   Recent Labs    07/30/19 1055 01/08/20 0751  GRAMSTAIN RARE WBC PRESENT, PREDOMINANTLY MONONUCLEAR NO ORGANISMS SEEN  RARE WBC PRESENT,BOTH PMN AND MONONUCLEAR NO ORGANISMS SEEN Performed at [facility], [place] [address] [place], [place] [address]   LABORGA  --  PSEUDOMONAS AERUGINOSA    Assessment and Plan:  Problem List Items Addressed This Visit      Endocrine   Diabetic foot ulcer ([place])    Other Visit Diagnoses    S/P foot surgery, right    -  Primary   Chronic osteomyelitis involving right ankle and foot ([place])       Acquired varus deformity of right ankle       Right foot pain         -Examined patient and discussed the progression of the wound and treatment alternatives. -Cleansed right foot surgical wound/ulcer bed  -Applied betadine and dry sterile dressing and instructed patient to continue with daily dressings at home consisting of same at minimum every other day or guaze packing as previous -Continue with cipro and clindamycin as Rx'd  - Advised patient to go to the ER or return to office if the wound worsens or if constitutional symptoms are present. -Patient to return to office in 1 week for follow up care with Dr. March Rummage and evaluation or sooner if problems arise.  [name], DPM

## 2020-01-26 ENCOUNTER — Ambulatory Visit (INDEPENDENT_AMBULATORY_CARE_PROVIDER_SITE_OTHER): Payer: Medicaid Other | Admitting: Podiatry

## 2020-01-26 ENCOUNTER — Other Ambulatory Visit: Payer: Self-pay

## 2020-01-26 DIAGNOSIS — L97514 Non-pressure chronic ulcer of other part of right foot with necrosis of bone: Secondary | ICD-10-CM | POA: Diagnosis not present

## 2020-01-26 DIAGNOSIS — M21171 Varus deformity, not elsewhere classified, right ankle: Secondary | ICD-10-CM | POA: Diagnosis not present

## 2020-01-26 DIAGNOSIS — E13621 Other specified diabetes mellitus with foot ulcer: Secondary | ICD-10-CM

## 2020-01-26 DIAGNOSIS — M86671 Other chronic osteomyelitis, right ankle and foot: Secondary | ICD-10-CM

## 2020-01-27 ENCOUNTER — Encounter: Payer: Self-pay | Admitting: Podiatry

## 2020-01-27 MED ORDER — CIPROFLOXACIN HCL 500 MG PO TABS
500.0000 mg | ORAL_TABLET | Freq: Every day | ORAL | 0 refills | Status: DC
Start: 2020-01-27 — End: 2020-02-09

## 2020-02-01 NOTE — Progress Notes (Signed)
  Subjective:  Patient ID: Jorge Higgins, male    DOB: 11-25-1975,  MRN: [id number]  No chief complaint on file.  DOS: 01/08/20 Procedure: Right foot wound debridement including bone  44 y.o. male presents with the above complaint.  States that the foot is doing a lot better not too much drainage..  Objective:  Physical Exam: no tenderness at the surgical site, no edema noted and calf supple, nontender. Incision: wound worsened since last visit, no significant erythema, slight clear drainage present.  3 x 2 without probe to bone. varus deformity present, chronic Assessment:   1. 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 Chronic osteomyelitis involving right ankle and foot ([place])   3. Acquired varus deformity of right ankle    Plan:  Patient was evaluated and treated and all questions answered.  Post-operative State -Wound well appearing. -Continue antibiotic therapy.  Refilled. -Remaining sutures and staples removed -Okay to soak 2-3 times weekly. -Dressed with Silvadene wet-to-dry

## 2020-02-02 ENCOUNTER — Ambulatory Visit: Payer: Medicaid Other | Admitting: Podiatry

## 2020-02-02 ENCOUNTER — Ambulatory Visit (INDEPENDENT_AMBULATORY_CARE_PROVIDER_SITE_OTHER): Payer: Medicaid Other | Admitting: Podiatry

## 2020-02-02 DIAGNOSIS — N184 Chronic kidney disease, stage 4 (severe): Secondary | ICD-10-CM | POA: Insufficient documentation

## 2020-02-02 DIAGNOSIS — E872 Acidosis, unspecified: Secondary | ICD-10-CM | POA: Insufficient documentation

## 2020-02-02 DIAGNOSIS — Z5329 Procedure and treatment not carried out because of patient's decision for other reasons: Secondary | ICD-10-CM

## 2020-02-02 DIAGNOSIS — E559 Vitamin D deficiency, unspecified: Secondary | ICD-10-CM | POA: Insufficient documentation

## 2020-02-02 DIAGNOSIS — R809 Proteinuria, unspecified: Secondary | ICD-10-CM | POA: Insufficient documentation

## 2020-02-02 DIAGNOSIS — D631 Anemia in chronic kidney disease: Secondary | ICD-10-CM | POA: Insufficient documentation

## 2020-02-02 NOTE — Progress Notes (Signed)
No show for appt. 

## 2020-02-09 ENCOUNTER — Encounter: Payer: Self-pay | Admitting: Podiatry

## 2020-02-09 ENCOUNTER — Other Ambulatory Visit: Payer: Self-pay

## 2020-02-09 ENCOUNTER — Ambulatory Visit: Payer: Medicaid Other | Admitting: Podiatry

## 2020-02-09 ENCOUNTER — Ambulatory Visit (INDEPENDENT_AMBULATORY_CARE_PROVIDER_SITE_OTHER): Payer: Medicaid Other | Admitting: Podiatry

## 2020-02-09 DIAGNOSIS — L97514 Non-pressure chronic ulcer of other part of right foot with necrosis of bone: Secondary | ICD-10-CM | POA: Diagnosis not present

## 2020-02-09 DIAGNOSIS — E13621 Other specified diabetes mellitus with foot ulcer: Secondary | ICD-10-CM | POA: Diagnosis not present

## 2020-02-09 DIAGNOSIS — M86671 Other chronic osteomyelitis, right ankle and foot: Secondary | ICD-10-CM

## 2020-02-09 MED ORDER — CIPROFLOXACIN HCL 500 MG PO TABS
500.0000 mg | ORAL_TABLET | Freq: Every day | ORAL | 0 refills | Status: DC
Start: 2020-02-09 — End: 2020-04-26

## 2020-02-09 MED ORDER — SILVER SULFADIAZINE 1 % EX CREA: TOPICAL_CREAM | CUTANEOUS | 0 refills | Status: AC

## 2020-02-09 NOTE — Progress Notes (Signed)
  Subjective:  Patient ID: Jorge Higgins, male    DOB: Nov 14, 1975,  MRN: [id number]  Chief Complaint  Patient presents with  . Routine Post Op    i am doing ok and getting better slowly each day on the right ankle area   DOS: 01/08/20 Procedure: Right foot wound debridement including bone  44 y.o. male presents with the above complaint.  States that the foot is doing a lot better not too much drainage.  Objective:  Physical Exam: no tenderness at the surgical site, no edema noted and calf supple, nontender. Incision: wound improving since last visit, no significant erythema, slight clear drainage present 2.5x2 without probe to bone. varus deformity present, chronic Assessment:   1. 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 Chronic osteomyelitis involving right ankle and foot ([place])    Plan:  Patient was evaluated and treated and all questions answered.  Post-operative State -Wound well appearing. -Continue antibiotic therapy.  Refilled.  -Minimal debridement today -Okay to soak 2-3 times weekly.  -Dressed with Prisma, DSD.

## 2020-02-10 NOTE — Progress Notes (Deleted)
[place] for Infectious Disease  Chief Complaint:  Follow up for DFU and Osteomyelitis  Subjective: Jorge Higgins is a 44 y.o. male with PMHx as below who presents to the clinic for follow up of diabetic foot ulcer and osteomyelitis. Please see A&P for the details of today's visit and status of the patient's medical problems.   Patient's Medications  New Prescriptions   No medications on file  Previous Medications   ALBUTEROL (VENTOLIN HFA) 108 (90 BASE) MCG/ACT INHALER    Inhale into the lungs every 4 (four) hours as needed.    AMLODIPINE (NORVASC) 10 MG TABLET    Take 10 mg by mouth daily.   ASCORBIC ACID (VITAMIN C) 500 MG CAPS    Take by mouth daily.   ASPIRIN 81 MG EC TABLET    Take by mouth.   BD PEN NEEDLE NANO 2ND GEN 32G X 4 MM MISC    USE TO INJECT DAILY LANTUS SOLOSTAR DOSE AND THREE TIMES DAILY HUMALOG KWIKPEN DOSE   CIPROFLOXACIN (CIPRO) 500 MG TABLET    Take 1 tablet (500 mg total) by mouth daily with breakfast.   CYANOCOBALAMIN (B-12) 2500 MCG TABS    Take by mouth daily.   ERGOCALCIFEROL 50 MCG (2000 UT) TABS    Take by mouth.   FAMOTIDINE (PEPCID) 20 MG TABLET    Take 20 mg by mouth 2 (two) times daily.   FERROUS SULFATE 325 (65 FE) MG EC TABLET    Take 325 mg by mouth daily.   GLIPIZIDE (GLUCOTROL) 5 MG TABLET    Take 2.5 mg by mouth daily.    INSULIN GLARGINE (LANTUS) 100 UNIT/ML SOLOSTAR PEN    Inject 5 Units into the skin at bedtime.    INSULIN LISPRO (HUMALOG) 100 UNIT/ML KWIKPEN    Inject into the skin.   LOSARTAN (COZAAR) 25 MG TABLET    Take 25 mg by mouth daily.   OMEGA-3 1000 MG CAPS    Take by mouth daily.   ONDANSETRON (ZOFRAN) 4 MG TABLET    Take 1 tablet (4 mg total) by mouth every 8 (eight) hours as needed for nausea or vomiting.   ONDANSETRON (ZOFRAN-ODT) 4 MG DISINTEGRATING TABLET    Take 4 mg by mouth every 8 (eight) hours as needed.   OXYCODONE-ACETAMINOPHEN (PERCOCET) 5-325 MG TABLET    Take 1 tablet by mouth every 4 (four)  hours as needed for severe pain.   SERTRALINE (ZOLOFT) 50 MG TABLET    Take 50 mg by mouth daily.   SILVER SULFADIAZINE (SILVADENE) 1 % CREAM    Apply pea-sized amount to wound every other day.  Modified Medications   No medications on file  Discontinued Medications   No medications on file     Past Medical History:  Diagnosis Date  . Anemia of renal disease   . Chronic osteomyelitis (HCC)    s/p  toes amputation's  . CKD (chronic kidney disease), stage III ([place])    --- dr hida sheikh  . Complication of anesthesia   . Depression   . Diabetic foot ulcer ([place])    right   . Diabetic neuropathy ([place])   . ED (erectile dysfunction)   . GERD (gastroesophageal reflux disease)   . History of syncope    03/ 2021  near syncope episode (admission at [facility])  hyperkalemia and orthostatic hypotension  (07-24-2019  per pt was side effects of taking levaquin and has ckd)  .  Hyperlipidemia   . Hypertension    followed by pcp   (07-24-2019 per pt never had a stress test)  . PONV (postoperative nausea and vomiting)   . Retinopathy   . Type 2 diabetes mellitus treated with insulin ([place])    followed by pcp   (10-21-2019 pt stated checks blood sugar TID,  fasting sugar-- 110    Family History  Problem Relation Age of Onset  . Diabetes Mellitus II Mother   . Hypertension Mother   . Diabetes Mellitus II Sister   . Alzheimer's disease Father   . Diabetes Maternal Grandmother   . Hypertension Maternal Grandmother   . Diabetes Maternal Grandfather   . Hypertension Maternal Grandfather   . Diabetes Paternal Grandmother   . Hypertension Paternal Grandmother   . Diabetes Paternal Grandfather   . Hypertension Paternal Grandfather     Social History   Socioeconomic History  . Marital status: Divorced    Spouse name: Not on file  . Number of children: Not on file  . Years of education: Not on file  . Highest education level: Not on file  Occupational History  . Not on file   Tobacco Use  . Smoking status: Former Smoker    Years: 6.00    Types: Cigarettes    Quit date: 06/24/2013    Years since quitting: 6.6  . Smokeless tobacco: Never Used  Vaping Use  . Vaping Use: Never used  Substance and Sexual Activity  . Alcohol use: Never  . Drug use: Never  . Sexual activity: Not on file  Other Topics Concern  . Not on file  Social History Narrative  . Not on file   Social Determinants of Health   Financial Resource Strain:   . Difficulty of Paying Living Expenses: Not on file  Food Insecurity:   . Worried About Charity fundraiser in the Last Year: Not on file  . Ran Out of Food in the Last Year: Not on file  Transportation Needs:   . Lack of Transportation (Medical): Not on file  . Lack of Transportation (Non-Medical): Not on file  Physical Activity:   . Days of Exercise per Week: Not on file  . Minutes of Exercise per Session: Not on file  Stress:   . Feeling of Stress : Not on file  Social Connections:   . Frequency of Communication with Friends and Family: Not on file  . Frequency of Social Gatherings with Friends and Family: Not on file  . Attends Religious Services: Not on file  . Active Member of Clubs or Organizations: Not on file  . Attends Archivist Meetings: Not on file  . Marital Status: Not on file  Intimate Partner Violence:   . Fear of Current or Ex-Partner: Not on file  . Emotionally Abused: Not on file  . Physically Abused: Not on file  . Sexually Abused: Not on file    Allergies  Allergen Reactions  . Azithromycin Nausea And Vomiting, Shortness Of Breath and Other (See Comments)  . Levaquin [Levofloxacin]     Dizziness, nausea, weakness, heart races  . Lipitor [Atorvastatin] Other (See Comments)    Dizziness, heart races, weakness  . Metoprolol Other (See Comments)    Elevates Potassium too much  . Hydromorphone Nausea And Vomiting  . Nickel Rash     Review of Systems: ROS  Objective: There were no  vitals filed for this visit. There is no height or weight on file to calculate  BMI.  Physical Exam    Pertinent Labs and Microbiology CBC Latest Ref Rng & Units 01/14/2020 12/25/2019 10/24/2019  WBC 3.8 - 10.8 Thousand/uL 8.5 9.3 -  Hemoglobin 13.2 - 17.1 g/dL 11.9(L) 12.6(L) 12.2(L)  Hematocrit 38 - 50 % 35.9(L) 38.5 36.0(L)  Platelets 140 - 400 Thousand/uL 485(H) 531(H) -   CMP Latest Ref Rng & Units 01/14/2020 12/25/2019 10/24/2019  Glucose 65 - 99 mg/dL 264(H) 149(H) 207(H)  BUN 7 - 25 mg/dL 33(H) 30(H) 27(H)  Creatinine 0.60 - 1.35 mg/dL 3.03(H) 2.97(H) 2.90(H)  Sodium 135 - 146 mmol/L 133(L) 135 141  Potassium 3.5 - 5.3 mmol/L 4.8 4.7 3.8  Chloride 98 - 110 mmol/L 103 104 104  CO2 20 - 32 mmol/L 24 25 -  Calcium 8.6 - 10.3 mg/dL 8.4(L) 8.7 -  Total Protein 6.1 - 8.1 g/dL 5.7(L) - -  Total Bilirubin 0.2 - 1.2 mg/dL 0.2 - -  Alkaline Phos 38 - 126 U/L - - -  AST 10 - 40 U/L 8(L) - -  ALT 9 - 46 U/L 8(L) - -     No results found for this or any previous visit (from the past 240 hour(s)).  Imaging IMPRESSION: 1. Severe chronic foot and ankle deformities with severe hindfoot varus. 2. Open wound lateral foot wound. No discrete drainable abscess but there is significant surrounding inflammation/edema/edema probable granulation tissue. 3. Findings highly suggestive of osteomyelitis involving the calcaneus and possibly the distal talus. 4. Tibiotalar, subtalar and talonavicular joint effusions and cartilage degeneration. Could not exclude septic arthritis. 5. Diffuse cellulitis and myositis without obvious findings for pyomyositis. 6. Evidence of prior cuboid and fifth metatarsal resection.  Assessment and Plan: No problem-specific Assessment & Plan notes found for this encounter.   No orders of the defined types were placed in this encounter.     Raynelle Highland for Infectious Disease [facility] 02/10/2020, 1:30 PM

## 2020-02-11 ENCOUNTER — Ambulatory Visit: Payer: Medicaid Other | Admitting: Internal Medicine

## 2020-02-11 ENCOUNTER — Telehealth: Payer: Self-pay | Admitting: Podiatry

## 2020-02-11 NOTE — Progress Notes (Signed)
[place] for Infectious Disease  Chief Complaint:  Follow up for DFU and Osteomyelitis  Subjective: Jorge Higgins is a 44 y.o. male with PMHx as below who presents to the clinic for follow up of diabetic foot ulcer and osteomyelitis.   Please see A&P for the details of today's visit and status of the patient's medical problems.   Patient's Medications  New Prescriptions   No medications on file  Previous Medications   ALBUTEROL (VENTOLIN HFA) 108 (90 BASE) MCG/ACT INHALER    Inhale into the lungs every 4 (four) hours as needed.    AMLODIPINE (NORVASC) 10 MG TABLET    Take 10 mg by mouth daily.   ASCORBIC ACID (VITAMIN C) 500 MG CAPS    Take by mouth daily.   ASPIRIN 81 MG EC TABLET    Take by mouth.   BD PEN NEEDLE NANO 2ND GEN 32G X 4 MM MISC    USE TO INJECT DAILY LANTUS SOLOSTAR DOSE AND THREE TIMES DAILY HUMALOG KWIKPEN DOSE   CIPROFLOXACIN (CIPRO) 500 MG TABLET    Take 1 tablet (500 mg total) by mouth daily with breakfast.   CLOTRIMAZOLE-BETAMETHASONE (LOTRISONE) CREAM    Apply topically.   CONTINUOUS BLOOD GLUC RECEIVER (DEXCOM G6 RECEIVER) DEVI       CYANOCOBALAMIN (B-12) 2500 MCG TABS    Take by mouth daily.   ERGOCALCIFEROL 50 MCG (2000 UT) TABS    Take by mouth.   FAMOTIDINE (PEPCID) 20 MG TABLET    Take 20 mg by mouth 2 (two) times daily.   FERROUS SULFATE 325 (65 FE) MG EC TABLET    Take 325 mg by mouth daily.   GLIPIZIDE (GLUCOTROL) 5 MG TABLET    Take 2.5 mg by mouth daily.    INSULIN GLARGINE (LANTUS) 100 UNIT/ML SOLOSTAR PEN    Inject 5 Units into the skin at bedtime.    INSULIN LISPRO (HUMALOG) 100 UNIT/ML KWIKPEN    Inject into the skin.   LOSARTAN (COZAAR) 25 MG TABLET    Take 25 mg by mouth daily.   OMEGA-3 1000 MG CAPS    Take by mouth daily.   ONDANSETRON (ZOFRAN) 4 MG TABLET    Take 1 tablet (4 mg total) by mouth every 8 (eight) hours as needed for nausea or vomiting.   ONDANSETRON (ZOFRAN-ODT) 4 MG DISINTEGRATING TABLET    Take 4 mg  by mouth every 8 (eight) hours as needed.   ROSUVASTATIN (CRESTOR) 10 MG TABLET    Take 10 mg by mouth daily.   SERTRALINE (ZOLOFT) 50 MG TABLET    Take 50 mg by mouth daily.   SILVER SULFADIAZINE (SILVADENE) 1 % CREAM    Apply pea-sized amount to wound every other day.  Modified Medications   No medications on file  Discontinued Medications   OXYCODONE-ACETAMINOPHEN (PERCOCET) 5-325 MG TABLET    Take 1 tablet by mouth every 4 (four) hours as needed for severe pain.     Past Medical History:  Diagnosis Date  . Anemia of renal disease   . Chronic osteomyelitis (HCC)    s/p  toes amputation's  . CKD (chronic kidney disease), stage III ([place])    --- dr hida sheikh  . Complication of anesthesia   . Depression   . Diabetic foot ulcer ([place])    right   . Diabetic neuropathy ([place])   . ED (erectile dysfunction)   . GERD (gastroesophageal reflux disease)   .  History of syncope    03/ 2021  near syncope episode (admission at [facility])  hyperkalemia and orthostatic hypotension  (07-24-2019  per pt was side effects of taking levaquin and has ckd)  . Hyperlipidemia   . Hypertension    followed by pcp   (07-24-2019 per pt never had a stress test)  . PONV (postoperative nausea and vomiting)   . Retinopathy   . Type 2 diabetes mellitus treated with insulin ([place])    followed by pcp   (10-21-2019 pt stated checks blood sugar TID,  fasting sugar-- 110    Family History  Problem Relation Age of Onset  . Diabetes Mellitus II Mother   . Hypertension Mother   . Diabetes Mellitus II Sister   . Alzheimer's disease Father   . Diabetes Maternal Grandmother   . Hypertension Maternal Grandmother   . Diabetes Maternal Grandfather   . Hypertension Maternal Grandfather   . Diabetes Paternal Grandmother   . Hypertension Paternal Grandmother   . Diabetes Paternal Grandfather   . Hypertension Paternal Grandfather     Social History   Socioeconomic History  . Marital status: Divorced     Spouse name: Not on file  . Number of children: Not on file  . Years of education: Not on file  . Highest education level: Not on file  Occupational History  . Not on file  Tobacco Use  . Smoking status: Former Smoker    Years: 6.00    Types: Cigarettes    Quit date: 06/24/2013    Years since quitting: 6.6  . Smokeless tobacco: Never Used  Vaping Use  . Vaping Use: Never used  Substance and Sexual Activity  . Alcohol use: Never  . Drug use: Never  . Sexual activity: Not on file  Other Topics Concern  . Not on file  Social History Narrative  . Not on file   Social Determinants of Health   Financial Resource Strain:   . Difficulty of Paying Living Expenses: Not on file  Food Insecurity:   . Worried About Charity fundraiser in the Last Year: Not on file  . Ran Out of Food in the Last Year: Not on file  Transportation Needs:   . Lack of Transportation (Medical): Not on file  . Lack of Transportation (Non-Medical): Not on file  Physical Activity:   . Days of Exercise per Week: Not on file  . Minutes of Exercise per Session: Not on file  Stress:   . Feeling of Stress : Not on file  Social Connections:   . Frequency of Communication with Friends and Family: Not on file  . Frequency of Social Gatherings with Friends and Family: Not on file  . Attends Religious Services: Not on file  . Active Member of Clubs or Organizations: Not on file  . Attends Archivist Meetings: Not on file  . Marital Status: Not on file  Intimate Partner Violence:   . Fear of Current or Ex-Partner: Not on file  . Emotionally Abused: Not on file  . Physically Abused: Not on file  . Sexually Abused: Not on file    Allergies  Allergen Reactions  . Azithromycin Nausea And Vomiting, Shortness Of Breath and Other (See Comments)  . Levaquin [Levofloxacin]     Dizziness, nausea, weakness, heart races  . Lipitor [Atorvastatin] Other (See Comments)    Dizziness, heart races, weakness  .  Metoprolol Other (See Comments)    Elevates Potassium too much  .  Hydromorphone Nausea And Vomiting  . Nickel Rash     Review of Systems: Review of Systems  Constitutional: Negative for chills and fever.  Gastrointestinal: Negative for abdominal pain, diarrhea, nausea and vomiting.  Skin: Negative for rash.  Neurological: Positive for weakness.    Objective: Vitals:   02/13/20 1103  BP: (!) 154/98  Pulse: 90  Resp: 16  Temp: 97.8 F (36.6 C)  TempSrc: Oral  SpO2: 90%  Weight: 198 lb (89.8 kg)  Height: '5\' 9"'  (1.753 m)   Body mass index is 29.24 kg/m.  Physical Exam Constitutional:      General: He is not in acute distress.    Appearance: Normal appearance.  Pulmonary:     Effort: Pulmonary effort is normal. No respiratory distress.  Musculoskeletal:        General: Swelling and deformity present.     Right lower leg: Edema present.  Skin:    General: Skin is warm and dry.     Coloration: Skin is not jaundiced.  Neurological:     General: No focal deficit present.     Mental Status: He is alert and oriented to person, place, and time.  Psychiatric:        Mood and Affect: Mood normal.        Behavior: Behavior normal.         Pertinent Labs and Microbiology CBC Latest Ref Rng & Units 01/14/2020 12/25/2019 10/24/2019  WBC 3.8 - 10.8 Thousand/uL 8.5 9.3 -  Hemoglobin 13.2 - 17.1 g/dL 11.9(L) 12.6(L) 12.2(L)  Hematocrit 38 - 50 % 35.9(L) 38.5 36.0(L)  Platelets 140 - 400 Thousand/uL 485(H) 531(H) -   CMP Latest Ref Rng & Units 01/14/2020 12/25/2019 10/24/2019  Glucose 65 - 99 mg/dL 264(H) 149(H) 207(H)  BUN 7 - 25 mg/dL 33(H) 30(H) 27(H)  Creatinine 0.60 - 1.35 mg/dL 3.03(H) 2.97(H) 2.90(H)  Sodium 135 - 146 mmol/L 133(L) 135 141  Potassium 3.5 - 5.3 mmol/L 4.8 4.7 3.8  Chloride 98 - 110 mmol/L 103 104 104  CO2 20 - 32 mmol/L 24 25 -  Calcium 8.6 - 10.3 mg/dL 8.4(L) 8.7 -  Total Protein 6.1 - 8.1 g/dL 5.7(L) - -  Total Bilirubin 0.2 - 1.2 mg/dL 0.2 - -    Alkaline Phos 38 - 126 U/L - - -  AST 10 - 40 U/L 8(L) - -  ALT 9 - 46 U/L 8(L) - -     C-Reactive Protein     Component Value Date/Time   CRP 9.7 (H) 01/14/2020 0954   CRP 40 (H) 05/30/2019 1420   Erythrocyte Sedimentation Rate     Component Value Date/Time   ESRSEDRATE >130 (H) 01/14/2020 0954    Imaging IMPRESSION: 1. Severe chronic foot and ankle deformities with severe hindfoot varus. 2. Open wound lateral foot wound. No discrete drainable abscess but there is significant surrounding inflammation/edema/edema probable granulation tissue. 3. Findings highly suggestive of osteomyelitis involving the calcaneus and possibly the distal talus. 4. Tibiotalar, subtalar and talonavicular joint effusions and cartilage degeneration. Could not exclude septic arthritis. 5. Diffuse cellulitis and myositis without obvious findings for pyomyositis. 6. Evidence of prior cuboid and fifth metatarsal resection.  Assessment and Plan: Acute osteomyelitis ([place]) Patient currently doing well on renally dosed ciprofloxacin and tolerating well thus far.  He has been following with Dr March Rummage regularly and patient pleased with the progress thus far.  Remains hopeful for successful eradication of infection and limb salvage.  Denies fevers, chills, n/v/d.  Plan: -- continue ciprofloxacin renally dosed for 6 weeks total (01/15/20-02/26/20).  -- repeat labs today (CBC, BMP, ESR, CRP) -- RTC at end of therapy, however, unsure how much benefit further antibiotics will be after this treatment course as he will likely most benefit from continued wound care, glycemic control, off loading, and regular podiatry follow up.     1. Type 2 diabetes mellitus with long term insulin use ([place]) Patient presented to clinic feeling like his sugar was dropping and lightheaded.  His CGM revealed low blood sugar.  He was quickly attended to by clinic nurses who provided him with [employer].  His blood sugar rose to 83  then 85 and stabilized.  He also felt much better by the time he left clinic.  2. Stage 3b chronic kidney disease ([place]) Repeat BMP today to ensure kidney function stable on current cipro dose   Orders Placed This Encounter  Procedures  . CBC w/Diff  . Sedimentation rate  . C-reactive protein  . BASIC METABOLIC PANEL WITH GFR    I spent greater than 45 minutes with the patient including greater than 50% of time in face to face counsel of the patient and/or in coordination of their care.   Raynelle Highland for Infectious Disease [facility] 02/13/2020, 11:48 AM

## 2020-02-11 NOTE — Telephone Encounter (Signed)
Dalbert Garnet, medical consultant for disability    [phone]  Called into today requesting a follow up from you regarding patient

## 2020-02-13 ENCOUNTER — Other Ambulatory Visit: Payer: Self-pay

## 2020-02-13 ENCOUNTER — Ambulatory Visit (INDEPENDENT_AMBULATORY_CARE_PROVIDER_SITE_OTHER): Payer: Medicaid Other | Admitting: Internal Medicine

## 2020-02-13 ENCOUNTER — Encounter: Payer: Self-pay | Admitting: Internal Medicine

## 2020-02-13 VITALS — BP 154/98 | HR 90 | Temp 97.8°F | Resp 16 | Ht 69.0 in | Wt 198.0 lb

## 2020-02-13 DIAGNOSIS — N1832 Chronic kidney disease, stage 3b: Secondary | ICD-10-CM | POA: Diagnosis not present

## 2020-02-13 DIAGNOSIS — M86171 Other acute osteomyelitis, right ankle and foot: Secondary | ICD-10-CM | POA: Diagnosis present

## 2020-02-13 DIAGNOSIS — M8610 Other acute osteomyelitis, unspecified site: Secondary | ICD-10-CM

## 2020-02-13 DIAGNOSIS — E1169 Type 2 diabetes mellitus with other specified complication: Secondary | ICD-10-CM | POA: Diagnosis not present

## 2020-02-13 NOTE — Patient Instructions (Signed)
Thank you for coming to see me today. It was a pleasure seeing you.  Please continue to follow up with Dr March Rummage for your wound care and podiatry care.  I want to continue the antibiotics you have been taking for 6 weeks to treat this bone infection.  Hopefully this will be successful in eradicating the infection in your foot.  I would like to see you back in several weeks after that just to make sure you are doing okay.  If you have any questions or concerns, please do not hesitate to call the office at [phone].  Take Care,   Jule Ser, DO

## 2020-02-13 NOTE — Assessment & Plan Note (Signed)
Patient currently doing well on renally dosed ciprofloxacin and tolerating well thus far.  He has been following with Dr March Rummage regularly and patient pleased with the progress thus far.  Remains hopeful for successful eradication of infection and limb salvage.  Denies fevers, chills, n/v/d.    Plan: -- continue ciprofloxacin renally dosed for 6 weeks total (01/15/20-02/26/20).  -- repeat labs today (CBC, BMP, ESR, CRP) -- RTC at end of therapy, however, unsure how much benefit further antibiotics will be after this treatment course as he will likely most benefit from continued wound care, glycemic control, off loading, and regular podiatry follow up.

## 2020-02-14 LAB — CBC WITH DIFFERENTIAL/PLATELET
Absolute Monocytes: 552 cells/uL (ref 200–950)
Basophils Absolute: 74 cells/uL (ref 0–200)
Basophils Relative: 0.8 %
Eosinophils Absolute: 515 cells/uL — ABNORMAL HIGH (ref 15–500)
Eosinophils Relative: 5.6 %
HCT: 35.7 % — ABNORMAL LOW (ref 38.5–50.0)
Hemoglobin: 12.1 g/dL — ABNORMAL LOW (ref 13.2–17.1)
Lymphs Abs: 2613 cells/uL (ref 850–3900)
MCH: 29.1 pg (ref 27.0–33.0)
MCHC: 33.9 g/dL (ref 32.0–36.0)
MCV: 85.8 fL (ref 80.0–100.0)
MPV: 9.7 fL (ref 7.5–12.5)
Monocytes Relative: 6 %
Neutro Abs: 5446 cells/uL (ref 1500–7800)
Neutrophils Relative %: 59.2 %
Platelets: 447 10*3/uL — ABNORMAL HIGH (ref 140–400)
RBC: 4.16 10*6/uL — ABNORMAL LOW (ref 4.20–5.80)
RDW: 13 % (ref 11.0–15.0)
Total Lymphocyte: 28.4 %
WBC: 9.2 10*3/uL (ref 3.8–10.8)

## 2020-02-14 LAB — BASIC METABOLIC PANEL WITH GFR
BUN/Creatinine Ratio: 10 (calc) (ref 6–22)
BUN: 32 mg/dL — ABNORMAL HIGH (ref 7–25)
CO2: 23 mmol/L (ref 20–32)
Calcium: 8.9 mg/dL (ref 8.6–10.3)
Chloride: 105 mmol/L (ref 98–110)
Creat: 3.34 mg/dL — ABNORMAL HIGH (ref 0.60–1.35)
GFR, Est African American: 24 mL/min/{1.73_m2} — ABNORMAL LOW (ref >=60)
GFR, Est Non African American: 21 mL/min/{1.73_m2} — ABNORMAL LOW (ref >=60)
Glucose, Bld: 138 mg/dL — ABNORMAL HIGH (ref 65–99)
Potassium: 5 mmol/L (ref 3.5–5.3)
Sodium: 137 mmol/L (ref 135–146)

## 2020-02-14 LAB — SEDIMENTATION RATE: Sed Rate: 129 mm/h — ABNORMAL HIGH (ref 0–15)

## 2020-02-14 LAB — C-REACTIVE PROTEIN: CRP: 4.2 mg/L (ref <8.0)

## 2020-02-16 ENCOUNTER — Ambulatory Visit: Payer: Medicaid Other | Admitting: Podiatry

## 2020-02-16 ENCOUNTER — Ambulatory Visit (INDEPENDENT_AMBULATORY_CARE_PROVIDER_SITE_OTHER): Payer: Medicaid Other | Admitting: Podiatry

## 2020-02-16 ENCOUNTER — Other Ambulatory Visit: Payer: Self-pay

## 2020-02-16 ENCOUNTER — Encounter: Payer: Self-pay | Admitting: Podiatry

## 2020-02-16 ENCOUNTER — Telehealth: Payer: Self-pay

## 2020-02-16 DIAGNOSIS — M21171 Varus deformity, not elsewhere classified, right ankle: Secondary | ICD-10-CM

## 2020-02-16 DIAGNOSIS — E13621 Other specified diabetes mellitus with foot ulcer: Secondary | ICD-10-CM

## 2020-02-16 DIAGNOSIS — L97514 Non-pressure chronic ulcer of other part of right foot with necrosis of bone: Secondary | ICD-10-CM

## 2020-02-16 DIAGNOSIS — M86671 Other chronic osteomyelitis, right ankle and foot: Secondary | ICD-10-CM

## 2020-02-16 NOTE — Telephone Encounter (Signed)
-----   Message from Mignon Pine, DO sent at 02/16/2020  9:28 AM EST ----- Can you please let patient know that his labs from Friday are stable.  His creatinine has gone up slightly from last month but does not require an adjustment to his ciprofloxacin dose.  Thanks!

## 2020-02-16 NOTE — Telephone Encounter (Signed)
Called the patient to relay that his labs remain stable and that his creatinine is up slightly from last month, but that no changes need to be made to his antibiotic per Dr. Juleen China. Patient verbalized understanding and has no further questions.   Beryle Flock, RN

## 2020-02-16 NOTE — Progress Notes (Signed)
  Subjective:  Patient ID: Sahan Chuck Hint, male    DOB: [birth year],  MRN: [id number]  Chief Complaint  Patient presents with  . Routine Post Op    i am doing ok on the right ankle area    DOS: 01/08/20 Procedure: Right foot wound debridement including bone  44 y.o. male presents with the above complaint.  Thinks the foot continues to do well.  Objective:  Physical Exam: no tenderness at the surgical site, no edema noted and calf supple, nontender. Incision: wound improving since last visit, no significant erythema, slight clear drainage present 2x1.5 without probe to bone. varus deformity present, chronic Assessment:   1. 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 Chronic osteomyelitis involving right ankle and foot ([place])   3. Acquired varus deformity of right ankle   4. Other chronic osteomyelitis of right foot ([place])    Plan:  Patient was evaluated and treated and all questions answered.  Post-operative State -Wound well appearing. Decreased in size. -Continue antibiotic therapy. States he has refill at the pharmacy. -Minimal debridement today -Okay to soak 2-3 times weekly.  -Dressed with Prisma, DSD. Order Trixie Deis

## 2020-02-25 ENCOUNTER — Encounter: Payer: Self-pay | Admitting: Podiatry

## 2020-03-04 NOTE — Telephone Encounter (Signed)
Called and spoke Antony Contras

## 2020-03-08 ENCOUNTER — Encounter: Payer: Self-pay | Admitting: Podiatry

## 2020-03-08 ENCOUNTER — Other Ambulatory Visit: Payer: Self-pay

## 2020-03-08 ENCOUNTER — Ambulatory Visit (INDEPENDENT_AMBULATORY_CARE_PROVIDER_SITE_OTHER): Payer: Medicaid Other | Admitting: Podiatry

## 2020-03-08 DIAGNOSIS — M86671 Other chronic osteomyelitis, right ankle and foot: Secondary | ICD-10-CM

## 2020-03-08 DIAGNOSIS — M21171 Varus deformity, not elsewhere classified, right ankle: Secondary | ICD-10-CM | POA: Diagnosis not present

## 2020-03-08 DIAGNOSIS — E13621 Other specified diabetes mellitus with foot ulcer: Secondary | ICD-10-CM

## 2020-03-08 DIAGNOSIS — M79671 Pain in right foot: Secondary | ICD-10-CM | POA: Diagnosis not present

## 2020-03-08 DIAGNOSIS — L97514 Non-pressure chronic ulcer of other part of right foot with necrosis of bone: Secondary | ICD-10-CM

## 2020-03-08 NOTE — Progress Notes (Signed)
  Subjective:  Patient ID: Aaban Chuck Hint, male    DOB: [birth year],  MRN: [id number]  Chief Complaint  Patient presents with  . Routine Post Op    the right foot is filling in and still drains some    DOS: 01/08/20 Procedure: Right foot wound debridement including bone  44 y.o. male presents with the above complaint.  Thinks the foot continues to do well.  Objective:  Physical Exam: no tenderness at the surgical site, no edema noted and calf supple, nontender. Incision: wound improving since last visit, no significant erythema, slight clear drainage present 2x1.5 without probe to bone. varus deformity present, chronic Assessment:   1. 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 Chronic osteomyelitis involving right ankle and foot ([place])   3. Acquired varus deformity of right ankle   4. Right foot pain    Plan:  Patient was evaluated and treated and all questions answered.  Post-operative State -Wound is much larger than previous today.  Sharply excisionally debrided as below. -At this point I think we may need to proceed with surgical invention.  He appears to be having worsening varus deformity to the right foot.  I think he needs some sort of tendon transfer.  Will consent for posterior tibial versus anterior tibial versus peroneus longus tendon transfers.  I would prefer his wound to be smaller or close to healing prior to surgical invention.  Discussed not putting weight on the foot in order to promote healing.  We did discuss that should this fail he is almost certainly looking at a [place] amputation or below-knee amputation.  Even with a Chopart amputation I am worried about possible calcaneal osteomyelitis.  We did discuss that there is an increased risk of surgery with an open wound however I think at some point will be unrealistic to assume that the wound will heal prior to deformity  correction. -Wound dressed with Prisma and dressed with dressing today.  Patient to continue soaking several times a week for reduction in odor and to help prevent infection.  Procedure: Excisional Debridement of Wound Indication: Removal of non-viable soft tissue from the wound to promote healing.  Anesthesia: none Pre-Debridement Wound Measurements: 4 cm x 4 cm x 0.3 cm  Post-Debridement Wound Measurements: 4.5 cm x 5 cm x 0.3 cm  Type of Debridement: Sharp Excisional Tissue Removed: Non-viable soft tissue Instrumentation: 15 blade and tissue nipper Depth of Debridement: subcutaneous tissue. Technique: Sharp excisional debridement to bleeding, viable wound base.  Dressing: Dry, sterile, compression dressing. Disposition: Patient tolerated procedure well. Patient to return in 1 week for follow-up.

## 2020-03-10 ENCOUNTER — Telehealth: Payer: Self-pay | Admitting: Podiatry

## 2020-03-10 NOTE — Telephone Encounter (Signed)
Jorge Higgins [phone] has requested call back regarding patients claim, please advise

## 2020-03-12 NOTE — Telephone Encounter (Signed)
Called, no answer. Left message to call my personal phone.

## 2020-03-15 ENCOUNTER — Other Ambulatory Visit: Payer: Self-pay

## 2020-03-15 ENCOUNTER — Ambulatory Visit (INDEPENDENT_AMBULATORY_CARE_PROVIDER_SITE_OTHER): Payer: Medicaid Other | Admitting: Podiatry

## 2020-03-15 DIAGNOSIS — M21171 Varus deformity, not elsewhere classified, right ankle: Secondary | ICD-10-CM

## 2020-03-15 DIAGNOSIS — E13621 Other specified diabetes mellitus with foot ulcer: Secondary | ICD-10-CM

## 2020-03-15 DIAGNOSIS — L97514 Non-pressure chronic ulcer of other part of right foot with necrosis of bone: Secondary | ICD-10-CM

## 2020-03-15 DIAGNOSIS — M86671 Other chronic osteomyelitis, right ankle and foot: Secondary | ICD-10-CM

## 2020-03-15 NOTE — Progress Notes (Signed)
  Subjective:  Patient ID: Jorge Higgins, male    DOB: 11-13-1975,  MRN: [id number]  No chief complaint on file.  DOS: 01/08/20 Procedure: Right foot wound debridement including bone  44 y.o. male presents with the above complaint.  Thinks the foot continues to do well.  Objective:  Physical Exam: no tenderness at the surgical site, no edema noted and calf supple, nontender. Incision: wound improving since last visit, no significant erythema, slight clear drainage present 1.5x1.5 without probe to bone. varus deformity present, chronic Assessment:   1. 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 Acquired varus deformity of right ankle    Plan:  Patient was evaluated and treated and all questions answered.  Post-operative State -Wound is much smaller today -Pending surgery at end of next month -No procedural debridement today. -Wound dressed with Prisma and dressed with dressing today.  Patient to continue soaking several times a week for reduction in odor and to help prevent infection.

## 2020-03-23 ENCOUNTER — Ambulatory Visit: Payer: Medicaid Other | Admitting: Internal Medicine

## 2020-03-29 ENCOUNTER — Other Ambulatory Visit: Payer: Self-pay

## 2020-03-29 ENCOUNTER — Ambulatory Visit (INDEPENDENT_AMBULATORY_CARE_PROVIDER_SITE_OTHER): Payer: Medicaid Other | Admitting: Podiatry

## 2020-03-29 DIAGNOSIS — Z5329 Procedure and treatment not carried out because of patient's decision for other reasons: Secondary | ICD-10-CM

## 2020-03-29 NOTE — Progress Notes (Signed)
Same day cancellation - patient sick.

## 2020-04-07 ENCOUNTER — Other Ambulatory Visit (INDEPENDENT_AMBULATORY_CARE_PROVIDER_SITE_OTHER): Payer: Self-pay | Admitting: Podiatry

## 2020-04-08 ENCOUNTER — Encounter: Payer: Self-pay | Admitting: Podiatry

## 2020-04-08 ENCOUNTER — Other Ambulatory Visit: Payer: Self-pay

## 2020-04-08 ENCOUNTER — Ambulatory Visit: Payer: Medicaid Other | Admitting: Podiatry

## 2020-04-08 ENCOUNTER — Ambulatory Visit (INDEPENDENT_AMBULATORY_CARE_PROVIDER_SITE_OTHER): Payer: Medicaid Other | Admitting: Podiatry

## 2020-04-08 DIAGNOSIS — M86671 Other chronic osteomyelitis, right ankle and foot: Secondary | ICD-10-CM

## 2020-04-08 DIAGNOSIS — L97514 Non-pressure chronic ulcer of other part of right foot with necrosis of bone: Secondary | ICD-10-CM | POA: Diagnosis not present

## 2020-04-08 DIAGNOSIS — E13621 Other specified diabetes mellitus with foot ulcer: Secondary | ICD-10-CM | POA: Diagnosis not present

## 2020-04-08 DIAGNOSIS — M21171 Varus deformity, not elsewhere classified, right ankle: Secondary | ICD-10-CM

## 2020-04-13 DIAGNOSIS — E119 Type 2 diabetes mellitus without complications: Secondary | ICD-10-CM | POA: Insufficient documentation

## 2020-04-13 DIAGNOSIS — Z87898 Personal history of other specified conditions: Secondary | ICD-10-CM | POA: Insufficient documentation

## 2020-04-13 DIAGNOSIS — R112 Nausea with vomiting, unspecified: Secondary | ICD-10-CM | POA: Insufficient documentation

## 2020-04-13 DIAGNOSIS — K219 Gastro-esophageal reflux disease without esophagitis: Secondary | ICD-10-CM | POA: Insufficient documentation

## 2020-04-13 DIAGNOSIS — F32A Depression, unspecified: Secondary | ICD-10-CM | POA: Insufficient documentation

## 2020-04-13 DIAGNOSIS — N189 Chronic kidney disease, unspecified: Secondary | ICD-10-CM | POA: Insufficient documentation

## 2020-04-13 DIAGNOSIS — N183 Chronic kidney disease, stage 3 unspecified: Secondary | ICD-10-CM | POA: Insufficient documentation

## 2020-04-13 DIAGNOSIS — E1140 Type 2 diabetes mellitus with diabetic neuropathy, unspecified: Secondary | ICD-10-CM | POA: Insufficient documentation

## 2020-04-13 DIAGNOSIS — E785 Hyperlipidemia, unspecified: Secondary | ICD-10-CM | POA: Insufficient documentation

## 2020-04-13 DIAGNOSIS — H3500 Unspecified background retinopathy: Secondary | ICD-10-CM | POA: Insufficient documentation

## 2020-04-13 DIAGNOSIS — T8859XA Other complications of anesthesia, initial encounter: Secondary | ICD-10-CM | POA: Insufficient documentation

## 2020-04-13 DIAGNOSIS — M8660 Other chronic osteomyelitis, unspecified site: Secondary | ICD-10-CM | POA: Insufficient documentation

## 2020-04-13 DIAGNOSIS — Z794 Long term (current) use of insulin: Secondary | ICD-10-CM | POA: Insufficient documentation

## 2020-04-13 DIAGNOSIS — Z9889 Other specified postprocedural states: Secondary | ICD-10-CM | POA: Insufficient documentation

## 2020-04-15 ENCOUNTER — Other Ambulatory Visit: Payer: Self-pay

## 2020-04-15 ENCOUNTER — Telehealth: Payer: Self-pay

## 2020-04-15 ENCOUNTER — Ambulatory Visit (INDEPENDENT_AMBULATORY_CARE_PROVIDER_SITE_OTHER): Payer: Medicaid Other | Admitting: Cardiology

## 2020-04-15 ENCOUNTER — Encounter: Payer: Self-pay | Admitting: Cardiology

## 2020-04-15 VITALS — BP 142/80 | HR 89 | Ht 69.0 in | Wt 200.4 lb

## 2020-04-15 DIAGNOSIS — I10 Essential (primary) hypertension: Secondary | ICD-10-CM | POA: Diagnosis not present

## 2020-04-15 DIAGNOSIS — I951 Orthostatic hypotension: Secondary | ICD-10-CM | POA: Diagnosis not present

## 2020-04-15 DIAGNOSIS — D631 Anemia in chronic kidney disease: Secondary | ICD-10-CM

## 2020-04-15 DIAGNOSIS — Z794 Long term (current) use of insulin: Secondary | ICD-10-CM

## 2020-04-15 DIAGNOSIS — E782 Mixed hyperlipidemia: Secondary | ICD-10-CM

## 2020-04-15 DIAGNOSIS — E1169 Type 2 diabetes mellitus with other specified complication: Secondary | ICD-10-CM | POA: Diagnosis not present

## 2020-04-15 DIAGNOSIS — N184 Chronic kidney disease, stage 4 (severe): Secondary | ICD-10-CM | POA: Diagnosis not present

## 2020-04-15 NOTE — Patient Instructions (Signed)

## 2020-04-15 NOTE — Progress Notes (Signed)
Cardiology Office Note:    Date:  04/15/2020   ID:  Jorge Higgins, DOB 1976/01/21, MRN [id number]  PCP:  Pcp, No  Cardiologist:  Berniece Salines, DO  Electrophysiologist:  None   Referring MD: Coy Saunas, MD   " I am doing well"  History of Present Illness:    Jorge Higgins is a 45 y.o. male with a hx of diabetes type 2, hypertension and recently with significant symptomatic orthostatic hypotension in the setting of dysautonomia, hyperlipidemia, diabetic foot ulcer with chronic osteomyelitis of the right foot currently with wound VAC, currently on long-term antibiotics due to his infection, CKD stage III, anemia of chronic disease.   I did see the patient on June 25, 2019 at that time I started him on midodrine to help with his orthostatic hypotension.  Also referred the patient to endocrinologist due to his uncontrolled diabetes.  I saw the patient in April 2021 at that time we kept him on the midodrine because his blood pressure was showing evidence of orthostatic hypotension.  Since that time he tells me he has been doing well from a cardiovascular standpoint he offers no complaints.  He is doing more than he did he is no longer on crutches he is able to cook his own meals and walk around and do little things.  He was recently granted disability for his multiple more medical conditions.   Past Medical History:  Diagnosis Date  . Anemia of renal disease   . Chronic osteomyelitis (HCC)    s/p  toes amputation's  . CKD (chronic kidney disease), stage III ([place])    --- dr hida sheikh  . Complication of anesthesia   . Depression   . Diabetic foot ulcer ([place])    right   . Diabetic neuropathy ([place])   . ED (erectile dysfunction)   . GERD (gastroesophageal reflux disease)   . History of syncope    03/ 2021  near syncope episode (admission at [facility])  hyperkalemia and orthostatic hypotension  (07-24-2019  per pt was side effects of taking levaquin and has  ckd)  . Hyperlipidemia   . Hypertension    followed by pcp   (07-24-2019 per pt never had a stress test)  . PONV (postoperative nausea and vomiting)   . Retinopathy   . Type 2 diabetes mellitus treated with insulin ([place])    followed by pcp   (10-21-2019 pt stated checks blood sugar TID,  fasting sugar-- 110    Past Surgical History:  Procedure Laterality Date  . AMPUTATION Left 09/19/2016   Procedure: PARTIAL 5th RAY AMPUTATION;  Surgeon: [name], DPM;  Location: [place];  Service: Podiatry;  Laterality: Left;  . AMPUTATION TOE Left 2019   complete amputation 5th toe  . BONE BIOPSY Right 01/08/2020   Procedure: RIGHT FOOT BONE BIOPSY;  Surgeon: [name], DPM;  Location: [place];  Service: Podiatry;  Laterality: Right;  . FOOT SURGERY Right x2  in 04/2019;  x1 02/ 2021  @ [facility]   includes taking a bone out , insertion of spacer, debridement, wound vac placement  . INCISION AND DRAINAGE OF WOUND Right 10/24/2019   Procedure: IRRIGATION AND DEBRIDEMENT WOUND AND CLOSURE;  Surgeon: [name], DPM;  Location: [place];  Service: Podiatry;  Laterality: Right;  LOCAL  . INCISION AND DRAINAGE OF WOUND Right 01/08/2020   Procedure: IRRIGATION AND DEBRIDEMENT RIGHT FOOT WOUND;  Surgeon: [name],  DPM;  Location: [place];  Service: Podiatry;  Laterality: Right;  . IRRIGATION AND DEBRIDEMENT FOOT Right 07/30/2019   Procedure: IRRIGATION AND DEBRIDEMENT FOOT, ANTIBIOTIC SPACER EXCHANGE, WOUND CLOSURE;  Surgeon: [name], DPM;  Location: [place];  Service: Podiatry;  Laterality: Right;  Antibiotic cement, Vancomycin powder, pulse lavage  . TOE AMPUTATION Left 11/13/2015   4th toe    Current Medications: Current Meds  Medication Sig  . ACCU-CHEK GUIDE test strip   . albuterol (VENTOLIN HFA) 108 (90 Base) MCG/ACT inhaler Inhale into the lungs every 4 (four) hours as needed.   Marland Kitchen  amLODipine (NORVASC) 10 MG tablet Take 10 mg by mouth daily.  . Ascorbic Acid (VITAMIN C) 500 MG CAPS Take by mouth daily.  Marland Kitchen aspirin 81 MG EC tablet Take by mouth.  . BD PEN NEEDLE NANO 2ND GEN 32G X 4 MM MISC USE TO INJECT DAILY LANTUS SOLOSTAR DOSE AND THREE TIMES DAILY HUMALOG KWIKPEN DOSE  . ciprofloxacin (CIPRO) 500 MG tablet Take 1 tablet (500 mg total) by mouth daily with breakfast.  . Continuous Blood Gluc Receiver ([place]) DEVI   . Continuous Blood Gluc Sensor (DEXCOM G6 SENSOR) MISC SMARTSIG:1 Each Topical Every 10 Days  . Cyanocobalamin (B-12) 2500 MCG TABS Take by mouth daily.  . Ergocalciferol 50 MCG (2000 UT) TABS Take by mouth.  . famotidine (PEPCID) 20 MG tablet Take 20 mg by mouth 2 (two) times daily.  . ferrous sulfate 325 (65 FE) MG EC tablet Take 325 mg by mouth daily.  Marland Kitchen glipiZIDE (GLUCOTROL) 5 MG tablet Take 2.5 mg by mouth daily.   . injection device for insulin (INPEN 100-BLUE-LILLY) DEVI Use to inject Humalog before meals.  Prefers blue  . insulin glargine (LANTUS) 100 UNIT/ML Solostar Pen Inject 5 Units into the skin at bedtime.   . insulin lispro (HUMALOG) 100 UNIT/ML KwikPen Inject into the skin.  Marland Kitchen losartan (COZAAR) 25 MG tablet Take 25 mg by mouth daily.  . Omega-3 1000 MG CAPS Take by mouth daily.  . ondansetron (ZOFRAN) 4 MG tablet Take 1 tablet (4 mg total) by mouth every 8 (eight) hours as needed for nausea or vomiting.  . ondansetron (ZOFRAN-ODT) 4 MG disintegrating tablet Take 4 mg by mouth every 8 (eight) hours as needed.  . rosuvastatin (CRESTOR) 10 MG tablet Take 10 mg by mouth daily.  . sertraline (ZOLOFT) 50 MG tablet Take 50 mg by mouth daily.  . silver sulfADIAZINE (SILVADENE) 1 % cream Apply pea-sized amount to wound every other day.     Allergies:   Azithromycin, Levaquin [levofloxacin], Lipitor [atorvastatin], Metoprolol, Hydromorphone, and Nickel   Social History   Socioeconomic History  . Marital status: Divorced    Spouse  name: Not on file  . Number of children: Not on file  . Years of education: Not on file  . Highest education level: Not on file  Occupational History  . Not on file  Tobacco Use  . Smoking status: Former Smoker    Years: 6.00    Types: Cigarettes    Quit date: 06/24/2013    Years since quitting: 6.8  . Smokeless tobacco: Never Used  Vaping Use  . Vaping Use: Never used  Substance and Sexual Activity  . Alcohol use: Never  . Drug use: Never  . Sexual activity: Not on file  Other Topics Concern  . Not on file  Social History Narrative  . Not on file   Social  Determinants of Health   Financial Resource Strain: Not on file  Food Insecurity: Not on file  Transportation Needs: Not on file  Physical Activity: Not on file  Stress: Not on file  Social Connections: Not on file     Family History: The patient's family history includes Alzheimer's disease in his father; Diabetes in his maternal grandfather, maternal grandmother, paternal grandfather, and paternal grandmother; Diabetes Mellitus II in his mother and sister; Hypertension in his maternal grandfather, maternal grandmother, mother, paternal grandfather, and paternal grandmother.  ROS:   Review of Systems  Constitution: Negative for decreased appetite, fever and weight gain.  HENT: Negative for congestion, ear discharge, hoarse voice and sore throat.   Eyes: Negative for discharge, redness, vision loss in right eye and visual halos.  Cardiovascular: Negative for chest pain, dyspnea on exertion, leg swelling, orthopnea and palpitations.  Respiratory: Negative for cough, hemoptysis, shortness of breath and snoring.   Endocrine: Negative for heat intolerance and polyphagia.  Hematologic/Lymphatic: Negative for bleeding problem. Does not bruise/bleed easily.  Skin: Negative for flushing, nail changes, rash and suspicious lesions.  Musculoskeletal: Negative for arthritis, joint pain, muscle cramps, myalgias, neck pain and  stiffness.  Gastrointestinal: Negative for abdominal pain, bowel incontinence, diarrhea and excessive appetite.  Genitourinary: Negative for decreased libido, genital sores and incomplete emptying.  Neurological: Negative for brief paralysis, focal weakness, headaches and loss of balance.  Psychiatric/Behavioral: Negative for altered mental status, depression and suicidal ideas.  Allergic/Immunologic: Negative for HIV exposure and persistent infections.    EKGs/Labs/Other Studies Reviewed:    The following studies were reviewed today:   EKG: None today  Recent Labs: 06/25/2019: Magnesium 1.7 01/14/2020: ALT 8 02/13/2020: BUN 32; Creat 3.34; Hemoglobin 12.1; Platelets 447; Potassium 5.0; Sodium 137  Recent Lipid Panel No results found for: CHOL, TRIG, HDL, CHOLHDL, VLDL, LDLCALC, LDLDIRECT  Physical Exam:    VS:  BP (!) 142/80   Pulse 89   Ht 5\' 9"  (1.753 m)   Wt 200 lb 6.4 oz (90.9 kg)   SpO2 99%   BMI 29.59 kg/m     Wt Readings from Last 3 Encounters:  04/15/20 200 lb 6.4 oz (90.9 kg)  02/13/20 198 lb (89.8 kg)  01/14/20 198 lb (89.8 kg)     GEN: Well nourished, well developed in no acute distress HEENT: Normal NECK: No JVD; No carotid bruits LYMPHATICS: No lymphadenopathy CARDIAC: S1S2 noted,RRR, no murmurs, rubs, gallops RESPIRATORY:  Clear to auscultation without rales, wheezing or rhonchi  ABDOMEN: Soft, non-tender, non-distended, +bowel sounds, no guarding. EXTREMITIES: No edema, No cyanosis, no clubbing MUSCULOSKELETAL:  No deformity  SKIN: Warm and dry NEUROLOGIC:  Alert and oriented x 3, non-focal PSYCHIATRIC:  Normal affect, good insight  ASSESSMENT:    1. Primary hypertension   2. Orthostatic hypotension   3. Type 2 diabetes mellitus with other specified complication, with long-term current use of insulin ([place])   4. Mixed diabetic hyperlipidemia associated with type 2 diabetes mellitus ([place])   5. Anemia of chronic renal failure, stage 4 (severe)  (HCC)   6. Mixed hyperlipidemia    PLAN:     He appears to be doing well from a cardiovascular standpoint.  I am not going to increase his antihypertensive medication given the patient has had history of recurrent orthostatic hypotension.  He is tolerating the amlodipine 10 mg daily along with the losartan 25 mg daily we will continue this.  In terms of his upcoming podiatric surgery the patient does not have any  unstable cardiac conditions.  Upon evaluation today, he can achieve 4 METs or greater without anginal symptoms.  According to [facility] and AHA guidelines, he requires no further cardiac workup prior to he noncardiac surgery and should be at acceptable risk.   Hyperlipidemia - continue with current statin medication.  This is being managed by his primary care doctor.  No adjustments for antidiabetic medications were made today.   The patient is in agreement with the above plan. The patient left the office in stable condition.  The patient will follow up in 1 year or sooner if needed.   Medication Adjustments/Labs and Tests Ordered: Current medicines are reviewed at length with the patient today.  Concerns regarding medicines are outlined above.  No orders of the defined types were placed in this encounter.  No orders of the defined types were placed in this encounter.   Patient Instructions  Medication Instructions:  Your physician recommends that you continue on your current medications as directed. Please refer to the Current Medication list given to you today.  *If you need a refill on your cardiac medications before your next appointment, please call your pharmacy*   Lab Work: None If you have labs (blood work) drawn today and your tests are completely normal, you will receive your results only by: Marland Kitchen MyChart Message (if you have MyChart) OR . A paper copy in the mail If you have any lab test that is abnormal or we need to change your treatment, we will call you to review  the results.   Testing/Procedures: None   Follow-Up: At [facility], you and your health needs are our priority.  As part of our continuing mission to provide you with exceptional heart care, we have created designated Provider Care Teams.  These Care Teams include your primary Cardiologist (physician) and Advanced Practice Providers (APPs -  Physician Assistants and Nurse Practitioners) who all work together to provide you with the care you need, when you need it.  We recommend signing up for the patient portal called "MyChart".  Sign up information is provided on this After Visit Summary.  MyChart is used to connect with patients for Virtual Visits (Telemedicine).  Patients are able to view lab/test results, encounter notes, upcoming appointments, etc.  Non-urgent messages can be sent to your provider as well.   To learn more about what you can do with MyChart, go to [email or web address].    Your next appointment:   1 year(s)  The format for your next appointment:   In Person  Provider:   Berniece Salines, DO   Other Instructions      Adopting a Healthy Lifestyle.  Know what a healthy weight is for you (roughly BMI <25) and aim to maintain this   Aim for 7+ servings of fruits and vegetables daily   65-80+ fluid ounces of water or unsweet tea for healthy kidneys   Limit to max 1 drink of alcohol per day; avoid smoking/tobacco   Limit animal fats in diet for cholesterol and heart health - choose grass fed whenever available   Avoid highly processed foods, and foods high in saturated/trans fats   Aim for low stress - take time to unwind and care for your mental health   Aim for 150 min of moderate intensity exercise weekly for heart health, and weights twice weekly for bone health   Aim for 7-9 hours of sleep daily   When it comes to diets, agreement about the perfect plan isnt easy to  find, even among the experts. Experts at the [employer]  developed an idea known as the Healthy Eating Plate. Just imagine a plate divided into logical, healthy portions.   The emphasis is on diet quality:   Load up on vegetables and fruits - one-half of your plate: Aim for color and variety, and remember that potatoes dont count.   Go for whole grains - one-quarter of your plate: Whole wheat, barley, wheat berries, quinoa, oats, brown rice, and foods made with them. If you want pasta, go with whole wheat pasta.   Protein power - one-quarter of your plate: Fish, chicken, beans, and nuts are all healthy, versatile protein sources. Limit red meat.   The diet, however, does go beyond the plate, offering a few other suggestions.   Use healthy plant oils, such as olive, canola, soy, corn, sunflower and peanut. Check the labels, and avoid partially hydrogenated oil, which have unhealthy trans fats.   If youre thirsty, drink water. Coffee and tea are good in moderation, but skip sugary drinks and limit milk and dairy products to one or two daily servings.   The type of carbohydrate in the diet is more important than the amount. Some sources of carbohydrates, such as vegetables, fruits, whole grains, and beans-are healthier than others.   Finally, stay active  Signed, Berniece Salines, DO  04/15/2020 10:43 AM    [place]

## 2020-04-15 NOTE — Telephone Encounter (Signed)
DOS 04/28/2020  TENDON TRANSFER - [id number] & [id number]   NOTIFICATION/PRIOR AUTHORIZATION NUMBER CASE STATUS CASE STATUS REASON PRIMARY CARE PHYSICIAN [id number] Open Open Raelene Bott ADVANCE NOTIFY DATE/TIME ADMISSION NOTIFY DATE/TIME 04/15/2020 01:23 PM CST - COVERAGE STATUS OVERALL COVERAGE STATUS Covered/Approved 1-2 CODE DESCRIPTION COVERAGE STATUS DECISION DATE [place] Coverage determination is reflected for the facility admission and is not a guarantee of payment for ongoing services. Covered/Approved 04/15/2020 1 [id number] Transfer or transplant of single tendon more  Transfer or transplant of single tendon (with muscle redirection or rerouting); superficial (eg, anterior tibial extensors into midfoot)  Covered/Approved 04/15/2020 2 [id number] Transfer or transplant of single tendon more Covered/Approved 04/15/2020

## 2020-04-22 ENCOUNTER — Encounter: Payer: Self-pay | Admitting: Podiatry

## 2020-04-22 ENCOUNTER — Ambulatory Visit (INDEPENDENT_AMBULATORY_CARE_PROVIDER_SITE_OTHER): Payer: Medicaid Other | Admitting: Podiatry

## 2020-04-22 ENCOUNTER — Other Ambulatory Visit: Payer: Self-pay

## 2020-04-22 DIAGNOSIS — E13621 Other specified diabetes mellitus with foot ulcer: Secondary | ICD-10-CM

## 2020-04-22 DIAGNOSIS — M21171 Varus deformity, not elsewhere classified, right ankle: Secondary | ICD-10-CM

## 2020-04-22 DIAGNOSIS — L97514 Non-pressure chronic ulcer of other part of right foot with necrosis of bone: Secondary | ICD-10-CM

## 2020-04-22 DIAGNOSIS — M79671 Pain in right foot: Secondary | ICD-10-CM

## 2020-04-22 DIAGNOSIS — M86671 Other chronic osteomyelitis, right ankle and foot: Secondary | ICD-10-CM

## 2020-04-22 NOTE — Progress Notes (Signed)
  Subjective:  Patient ID: Jorge Higgins, male    DOB: [birth year],  MRN: [id number]  Chief Complaint  Patient presents with  . Routine Post Op    I am doing better and the silvadene is what I have been using on the right foot    DOS: 01/08/20 Procedure: Right foot wound debridement including bone  45 y.o. male presents with the above complaint. Using silvadene on the wound, thinks the wound is doing a little better.  Objective:  Physical Exam: no tenderness at the surgical site, no edema noted and calf supple, nontender. Incision: Wound fully granular 2x3.5, no significant erythema, slight clear drainage present, chronic varus deformity.   Assessment:   1. 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 Chronic osteomyelitis involving right ankle and foot ([place])   3. Acquired varus deformity of right ankle   4. Right foot pain    Plan:  Patient was evaluated and treated and all questions answered.  Post-operative State -Wound a little larger this time, but remains healthy without signs of infection -Pending surgery next week. We reviewed the goals of surgery as to improve the varus deformity sufficiently for wound healing. We did discuss limitations and that it is likely that he will still have continued deformity after surgery. He will likely need fusion for full correction however would prefer wound fully healed before that. Proceed with surgery as planned.

## 2020-04-22 NOTE — H&P (View-Only) (Signed)
  Subjective:  Patient ID: Jorge Higgins, male    DOB: 08/06/1975,  MRN: [id number]  Chief Complaint  Patient presents with  . Routine Post Op    I am doing better and the silvadene is what I have been using on the right foot    DOS: 01/08/20 Procedure: Right foot wound debridement including bone  45 y.o. male presents with the above complaint. Using silvadene on the wound, thinks the wound is doing a little better.  Objective:  Physical Exam: no tenderness at the surgical site, no edema noted and calf supple, nontender. Incision: Wound fully granular 2x3.5, no significant erythema, slight clear drainage present, chronic varus deformity.   Assessment:   1. 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 Chronic osteomyelitis involving right ankle and foot ([place])   3. Acquired varus deformity of right ankle   4. Right foot pain    Plan:  Patient was evaluated and treated and all questions answered.  Post-operative State -Wound a little larger this time, but remains healthy without signs of infection -Pending surgery next week. We reviewed the goals of surgery as to improve the varus deformity sufficiently for wound healing. We did discuss limitations and that it is likely that he will still have continued deformity after surgery. He will likely need fusion for full correction however would prefer wound fully healed before that. Proceed with surgery as planned.

## 2020-04-24 ENCOUNTER — Other Ambulatory Visit (HOSPITAL_COMMUNITY)
Admission: RE | Admit: 2020-04-24 | Discharge: 2020-04-24 | Disposition: A | Discharge status: Home / Self Care | Payer: Medicaid Other | Source: Ambulatory Visit | Attending: Podiatry | Admitting: Podiatry

## 2020-04-24 DIAGNOSIS — Z20822 Contact with and (suspected) exposure to covid-19: Secondary | ICD-10-CM | POA: Insufficient documentation

## 2020-04-24 DIAGNOSIS — Z01812 Encounter for preprocedural laboratory examination: Secondary | ICD-10-CM | POA: Diagnosis not present

## 2020-04-24 LAB — SARS CORONAVIRUS 2 (TAT 6-24 HRS): SARS Coronavirus 2: NEGATIVE

## 2020-04-26 ENCOUNTER — Encounter (HOSPITAL_BASED_OUTPATIENT_CLINIC_OR_DEPARTMENT_OTHER): Payer: Self-pay | Admitting: Podiatry

## 2020-04-26 ENCOUNTER — Other Ambulatory Visit: Payer: Self-pay

## 2020-04-26 NOTE — Progress Notes (Addendum)
Spoke w/ via phone for pre-op interview---pt Lab needs dos----  I stat             Lab results------ekg 06-18-2019 [facility] in epic COVID test ------04-24-2020 900 Arrive at -------800 am 04-28-2020 NPO after MN NO Solid Food.  Clear liquids from MN until---700 am then npo Medications to take morning of surgery -----albuterol inhaler prn/bring inhaler, amlodipine, famotidine, sertraline Diabetic medication -----take 1/2 dose lantus insulin hs 04-27-2020, no diabetic meds or insulin day of surgery Patient Special Instructions -----none Pre-Op special Istructions -----none Patient verbalized understanding of instructions that were given at this phone interview. Patient denies shortness of breath, chest pain, fever, cough at this phone interview.  H & P /nephrology clearance Dr Danton Clap dated 04-07-2020 on chart for 04-28-2020 surgery Nephrology lov dr Caryl Asp 04-07-2020 care everywhere stage 4 ckd  Patient needs blood refusal signed day of surgery (will accept albumin per pt) Patient will call front desk for wheelchair am of surgery Patient has dexcom 6 will remove am of surgery

## 2020-04-28 ENCOUNTER — Encounter (HOSPITAL_BASED_OUTPATIENT_CLINIC_OR_DEPARTMENT_OTHER): Payer: Self-pay | Admitting: Podiatry

## 2020-04-28 ENCOUNTER — Other Ambulatory Visit: Payer: Self-pay

## 2020-04-28 ENCOUNTER — Ambulatory Visit (HOSPITAL_BASED_OUTPATIENT_CLINIC_OR_DEPARTMENT_OTHER)
Admission: RE | Admit: 2020-04-28 | Discharge: 2020-04-28 | Disposition: A | Discharge status: Home / Self Care | Payer: Medicaid Other | Attending: Podiatry | Admitting: Podiatry

## 2020-04-28 ENCOUNTER — Encounter (HOSPITAL_BASED_OUTPATIENT_CLINIC_OR_DEPARTMENT_OTHER)
Admission: RE | Disposition: A | Discharge status: Home / Self Care | Payer: Self-pay | Source: Home / Self Care | Attending: Podiatry

## 2020-04-28 ENCOUNTER — Ambulatory Visit (HOSPITAL_BASED_OUTPATIENT_CLINIC_OR_DEPARTMENT_OTHER): Payer: Medicaid Other | Admitting: Anesthesiology

## 2020-04-28 DIAGNOSIS — E11621 Type 2 diabetes mellitus with foot ulcer: Secondary | ICD-10-CM | POA: Insufficient documentation

## 2020-04-28 DIAGNOSIS — M86671 Other chronic osteomyelitis, right ankle and foot: Secondary | ICD-10-CM | POA: Insufficient documentation

## 2020-04-28 DIAGNOSIS — M21171 Varus deformity, not elsewhere classified, right ankle: Secondary | ICD-10-CM | POA: Diagnosis not present

## 2020-04-28 DIAGNOSIS — E1169 Type 2 diabetes mellitus with other specified complication: Secondary | ICD-10-CM | POA: Insufficient documentation

## 2020-04-28 DIAGNOSIS — L97514 Non-pressure chronic ulcer of other part of right foot with necrosis of bone: Secondary | ICD-10-CM | POA: Diagnosis not present

## 2020-04-28 DIAGNOSIS — E1152 Type 2 diabetes mellitus with diabetic peripheral angiopathy with gangrene: Secondary | ICD-10-CM | POA: Diagnosis not present

## 2020-04-28 HISTORY — DX: Myoneural disorder, unspecified: G70.9

## 2020-04-28 HISTORY — DX: Chronic kidney disease, stage 4 (severe): E11.22

## 2020-04-28 HISTORY — DX: Chronic kidney disease, stage 4 (severe): N18.4

## 2020-04-28 HISTORY — DX: Dependence on other enabling machines and devices: Z99.89

## 2020-04-28 HISTORY — DX: Family history of other specified conditions: Z84.89

## 2020-04-28 HISTORY — PX: TENDON TRANSFER: SHX6109

## 2020-04-28 HISTORY — DX: Psoriasis, unspecified: L40.9

## 2020-04-28 HISTORY — DX: Non-pressure chronic ulcer of other part of right foot with unspecified severity: L97.519

## 2020-04-28 HISTORY — DX: Acute upper respiratory infection, unspecified: J06.9

## 2020-04-28 LAB — POCT I-STAT, CHEM 8
BUN: 33 mg/dL — ABNORMAL HIGH (ref 6–20)
Calcium, Ion: 1.21 mmol/L (ref 1.15–1.40)
Chloride: 108 mmol/L (ref 98–111)
Creatinine, Ser: 4.1 mg/dL — ABNORMAL HIGH (ref 0.61–1.24)
Glucose, Bld: 134 mg/dL — ABNORMAL HIGH (ref 70–99)
HCT: 34 % — ABNORMAL LOW (ref 39.0–52.0)
Hemoglobin: 11.6 g/dL — ABNORMAL LOW (ref 13.0–17.0)
Potassium: 5.1 mmol/L (ref 3.5–5.1)
Sodium: 137 mmol/L (ref 135–145)
TCO2: 21 mmol/L — ABNORMAL LOW (ref 22–32)

## 2020-04-28 LAB — NO BLOOD PRODUCTS

## 2020-04-28 LAB — GLUCOSE, CAPILLARY: Glucose-Capillary: 99 mg/dL (ref 70–99)

## 2020-04-28 SURGERY — TRANSFER, TENDON
Anesthesia: Monitor Anesthesia Care | Site: Foot | Laterality: Right

## 2020-04-28 MED ORDER — CEFAZOLIN SODIUM-DEXTROSE 2-4 GM/100ML-% IV SOLN
INTRAVENOUS | Status: AC
  Filled 2020-04-28: qty 100

## 2020-04-28 MED ORDER — MIDAZOLAM HCL 5 MG/5ML IJ SOLN
INTRAMUSCULAR | Status: DC | PRN
  Administered 2020-04-28 (×2): 1 mg via INTRAVENOUS

## 2020-04-28 MED ORDER — CLINDAMYCIN PHOSPHATE 900 MG/50ML IV SOLN
INTRAVENOUS | Status: AC
  Filled 2020-04-28: qty 50

## 2020-04-28 MED ORDER — PROPOFOL 500 MG/50ML IV EMUL
INTRAVENOUS | Status: AC
  Filled 2020-04-28: qty 50

## 2020-04-28 MED ORDER — PROPOFOL 500 MG/50ML IV EMUL
INTRAVENOUS | Status: DC | PRN
  Administered 2020-04-28: 50 ug/kg/min via INTRAVENOUS

## 2020-04-28 MED ORDER — LIDOCAINE HCL (CARDIAC) PF 100 MG/5ML IV SOSY
PREFILLED_SYRINGE | INTRAVENOUS | Status: DC | PRN
  Administered 2020-04-28: 60 mg via INTRAVENOUS

## 2020-04-28 MED ORDER — FENTANYL CITRATE (PF) 100 MCG/2ML IJ SOLN
INTRAMUSCULAR | Status: DC | PRN
  Administered 2020-04-28 (×2): 50 ug via INTRAVENOUS

## 2020-04-28 MED ORDER — BUPIVACAINE HCL (PF) 0.5 % IJ SOLN
INTRAMUSCULAR | Status: DC | PRN
  Administered 2020-04-28: 10 mL

## 2020-04-28 MED ORDER — CLINDAMYCIN PHOSPHATE 900 MG/50ML IV SOLN
900.0000 mg | Freq: Once | INTRAVENOUS | Status: AC
  Administered 2020-04-28: 900 mg via INTRAVENOUS

## 2020-04-28 MED ORDER — ONDANSETRON HCL 4 MG/2ML IJ SOLN
INTRAMUSCULAR | Status: AC
  Filled 2020-04-28: qty 2

## 2020-04-28 MED ORDER — CLINDAMYCIN HCL 150 MG PO CAPS: 150.0000 mg | ORAL_CAPSULE | Freq: Two times a day (BID) | ORAL | 0 refills | Status: AC

## 2020-04-28 MED ORDER — FENTANYL CITRATE (PF) 100 MCG/2ML IJ SOLN
INTRAMUSCULAR | Status: AC
  Filled 2020-04-28: qty 2

## 2020-04-28 MED ORDER — CEFAZOLIN SODIUM-DEXTROSE 2-4 GM/100ML-% IV SOLN: 2.0000 g | INTRAVENOUS | Status: DC

## 2020-04-28 MED ORDER — DIPHENHYDRAMINE HCL 50 MG/ML IJ SOLN
INTRAMUSCULAR | Status: AC
  Filled 2020-04-28: qty 1

## 2020-04-28 MED ORDER — DIPHENHYDRAMINE HCL 50 MG/ML IJ SOLN
INTRAMUSCULAR | Status: DC | PRN
  Administered 2020-04-28: 6.25 mg via INTRAVENOUS

## 2020-04-28 MED ORDER — ONDANSETRON HCL 4 MG/2ML IJ SOLN
INTRAMUSCULAR | Status: DC | PRN
  Administered 2020-04-28: 4 mg via INTRAVENOUS

## 2020-04-28 MED ORDER — SODIUM CHLORIDE 0.9 % IV SOLN
INTRAVENOUS | Status: DC
  Administered 2020-04-28: 50 mL via INTRAVENOUS

## 2020-04-28 MED ORDER — LIDOCAINE HCL (PF) 2 % IJ SOLN
INTRAMUSCULAR | Status: AC
  Filled 2020-04-28: qty 5

## 2020-04-28 MED ORDER — FENTANYL CITRATE (PF) 100 MCG/2ML IJ SOLN: 25.0000 ug | INTRAMUSCULAR | Status: DC | PRN

## 2020-04-28 MED ORDER — ACETAMINOPHEN 500 MG PO TABS
1000.0000 mg | ORAL_TABLET | Freq: Once | ORAL | Status: AC
  Administered 2020-04-28: 1000 mg via ORAL

## 2020-04-28 MED ORDER — OXYCODONE-ACETAMINOPHEN 5-325 MG PO TABS: 1.0000 | ORAL_TABLET | ORAL | 0 refills | Status: AC | PRN

## 2020-04-28 MED ORDER — MIDAZOLAM HCL 2 MG/2ML IJ SOLN
INTRAMUSCULAR | Status: AC
  Filled 2020-04-28: qty 2

## 2020-04-28 SURGICAL SUPPLY — 74 items
ADH SKN CLS APL DERMABOND .7 (GAUZE/BANDAGES/DRESSINGS) ×1
APL PRP STRL LF DISP 70% ISPRP (MISCELLANEOUS) ×1
BANDAGE ESMARK 6X9 LF (GAUZE/BANDAGES/DRESSINGS) ×1 IMPLANT
BLADE HEX COATED 2.75 (ELECTRODE) ×2 IMPLANT
BLADE SURG 15 STRL LF DISP TIS (BLADE) ×2 IMPLANT
BLADE SURG 15 STRL SS (BLADE) ×4
BNDG CMPR 9X6 STRL LF SNTH (GAUZE/BANDAGES/DRESSINGS) ×1
BNDG CONFORM 2 STRL LF (GAUZE/BANDAGES/DRESSINGS) IMPLANT
BNDG ELASTIC 3X5.8 VLCR STR LF (GAUZE/BANDAGES/DRESSINGS) ×2 IMPLANT
BNDG ELASTIC 4X5.8 VLCR STR LF (GAUZE/BANDAGES/DRESSINGS) ×2 IMPLANT
BNDG ESMARK 6X9 LF (GAUZE/BANDAGES/DRESSINGS) ×2
BNDG GAUZE ELAST 4 BULKY (GAUZE/BANDAGES/DRESSINGS) ×2 IMPLANT
BRUSH SCRUB EZ PLAIN DRY (MISCELLANEOUS) ×2 IMPLANT
CHLORAPREP W/TINT 26 (MISCELLANEOUS) ×2 IMPLANT
COVER BACK TABLE 60X90IN (DRAPES) ×2 IMPLANT
COVER WAND RF STERILE (DRAPES) ×2 IMPLANT
CUFF TOURN SGL QUICK 18X4 (TOURNIQUET CUFF) ×2 IMPLANT
CUFF TOURN SGL QUICK 24 (TOURNIQUET CUFF)
CUFF TRNQT CYL 24X4X16.5-23 (TOURNIQUET CUFF) IMPLANT
DERMABOND ADVANCED (GAUZE/BANDAGES/DRESSINGS) ×1
DERMABOND ADVANCED .7 DNX12 (GAUZE/BANDAGES/DRESSINGS) ×1 IMPLANT
DRAPE 3/4 80X56 (DRAPES) ×2 IMPLANT
DRAPE EXTREMITY T 121X128X90 (DISPOSABLE) ×2 IMPLANT
DRAPE INCISE IOBAN 66X45 STRL (DRAPES) ×2 IMPLANT
DRAPE OEC MINIVIEW 54X84 (DRAPES) ×2 IMPLANT
DRAPE U-SHAPE 47X51 STRL (DRAPES) ×2 IMPLANT
DRSG EMULSION OIL 3X3 NADH (GAUZE/BANDAGES/DRESSINGS) ×2 IMPLANT
DRSG PAD ABDOMINAL 8X10 ST (GAUZE/BANDAGES/DRESSINGS) IMPLANT
ELECT REM PT RETURN 9FT ADLT (ELECTROSURGICAL) ×2
ELECTRODE REM PT RTRN 9FT ADLT (ELECTROSURGICAL) ×1 IMPLANT
GAUZE SPONGE 4X4 12PLY STRL (GAUZE/BANDAGES/DRESSINGS) ×2 IMPLANT
GAUZE XEROFORM 5X9 LF (GAUZE/BANDAGES/DRESSINGS) ×2 IMPLANT
GLOVE BIO SURGEON STRL SZ7.5 (GLOVE) ×2 IMPLANT
GLOVE ECLIPSE 7.5 STRL STRAW (GLOVE) ×2 IMPLANT
GLOVE SRG 8 PF TXTR STRL LF DI (GLOVE) ×1 IMPLANT
GLOVE SURG UNDER POLY LF SZ8 (GLOVE) ×2
GOWN STRL REUS W/ TWL XL LVL3 (GOWN DISPOSABLE) ×1 IMPLANT
GOWN STRL REUS W/TWL LRG LVL3 (GOWN DISPOSABLE) ×4 IMPLANT
GOWN STRL REUS W/TWL XL LVL3 (GOWN DISPOSABLE) ×2
KIT SIZER GRAFT TENODESIS SUTR (KITS) ×2 IMPLANT
KIT TURNOVER CYSTO (KITS) ×2 IMPLANT
NEEDLE HYPO 25X1 1.5 SAFETY (NEEDLE) IMPLANT
NS IRRIG 1000ML POUR BTL (IV SOLUTION) IMPLANT
PACK BASIN DAY SURGERY FS (CUSTOM PROCEDURE TRAY) ×2 IMPLANT
PAD CAST 4YDX4 CTTN HI CHSV (CAST SUPPLIES) ×3 IMPLANT
PADDING CAST ABS 4INX4YD NS (CAST SUPPLIES) ×1
PADDING CAST ABS COTTON 4X4 ST (CAST SUPPLIES) ×1 IMPLANT
PADDING CAST COTTON 4X4 STRL (CAST SUPPLIES) ×6
PENCIL SMOKE EVACUATOR (MISCELLANEOUS) ×2 IMPLANT
SLEEVE SCD COMPRESS KNEE MED (MISCELLANEOUS) ×2 IMPLANT
SPLINT FIBERGLASS 4X30 (CAST SUPPLIES) ×4 IMPLANT
STAPLER VISISTAT 35W (STAPLE) ×2 IMPLANT
STOCKINETTE 6  STRL (DRAPES) ×1
STOCKINETTE 6 STRL (DRAPES) ×1 IMPLANT
SUCTION FRAZIER HANDLE 10FR (MISCELLANEOUS)
SUCTION TUBE FRAZIER 10FR DISP (MISCELLANEOUS) IMPLANT
SUT ETHIBOND 2-0 V-5 NEEDLE (SUTURE) ×2 IMPLANT
SUT ETHILON 3 0 PS 1 (SUTURE) ×2 IMPLANT
SUT MERSILENE 4 0 P 3 (SUTURE) ×2 IMPLANT
SUT MNCRL AB 3-0 PS2 27 (SUTURE) ×6 IMPLANT
SUT MON AB 5-0 PS2 18 (SUTURE) ×2 IMPLANT
SUT SILK 3 0 SH 30 (SUTURE) ×2 IMPLANT
SUT VIC AB 0 CT1 36 (SUTURE) IMPLANT
SUT VIC AB 2-0 SH 27 (SUTURE)
SUT VIC AB 2-0 SH 27XBRD (SUTURE) IMPLANT
SUT VIC AB 3-0 FS2 27 (SUTURE) ×4 IMPLANT
SUT VIC AB 4-0 PS2 18 (SUTURE) IMPLANT
SYR BULB EAR ULCER 3OZ GRN STR (SYRINGE) ×2 IMPLANT
SYR CONTROL 10ML LL (SYRINGE) IMPLANT
SYSTEM IMPLANT FDL 4.75 (Anchor) ×2 IMPLANT
TOWEL OR 17X26 10 PK STRL BLUE (TOWEL DISPOSABLE) ×2 IMPLANT
TUBE CONNECTING 12X1/4 (SUCTIONS) ×2 IMPLANT
Tenodesis Graft Sizing Kit with Fiber Loop Suture (Graft) ×2 IMPLANT
UNDERPAD 30X36 HEAVY ABSORB (UNDERPADS AND DIAPERS) ×2 IMPLANT

## 2020-04-28 NOTE — Interval H&P Note (Signed)
Anesthesia H&P Update: History and Physical Exam reviewed; patient is OK for planned anesthetic and procedure. ? ?

## 2020-04-28 NOTE — Brief Op Note (Signed)
04/28/2020  12:36 PM  PATIENT:  Jorge Higgins  45 y.o. male  PRE-OPERATIVE DIAGNOSIS:  VARUS DEFORMITY OF RIGHT ANKLE  POST-OPERATIVE DIAGNOSIS:  VARUS DEFORMITY OF RIGHT ANKLE  PROCEDURE:  Procedure(s): TENDON TRANSFER RIGHT FOOT (Right)  SURGEON:  Surgeon(s) and Role:    * Whitlee Sluder, Christian Mate, DPM - Primary    * McDonald, Stephan Minister, DPM  PHYSICIAN ASSISTANT:   ASSISTANTS: none   ANESTHESIA:   local and MAC  EBL:  50  BLOOD ADMINISTERED:none  DRAINS: none   LOCAL MEDICATIONS USED:  MARCAINE    and Amount: 10 ml  SPECIMEN:  No Specimen  DISPOSITION OF SPECIMEN:  N/A  COUNTS:  YES  TOURNIQUET:   Total Tourniquet Time Documented: Calf (Right) - 1 minutes Total: Calf (Right) - 1 minutes   DICTATION: .Dragon Dictation  PLAN OF CARE: Discharge to home after PACU  PATIENT DISPOSITION:  PACU - hemodynamically stable.   Delay start of Pharmacological VTE agent (>24hrs) due to surgical blood loss or risk of bleeding: not applicable

## 2020-04-28 NOTE — Anesthesia Postprocedure Evaluation (Signed)
Anesthesia Post Note  Patient: Jorge Higgins  Procedure(s) Performed: TENDON TRANSFER RIGHT FOOT (Right Foot)     Patient location during evaluation: PACU Anesthesia Type: MAC Level of consciousness: awake and alert Pain management: pain level controlled Vital Signs Assessment: post-procedure vital signs reviewed and stable Respiratory status: spontaneous breathing, nonlabored ventilation, respiratory function stable and patient connected to nasal cannula oxygen Cardiovascular status: stable and blood pressure returned to baseline Postop Assessment: no apparent nausea or vomiting Anesthetic complications: no   No complications documented.  Last Vitals:  Vitals:   04/28/20 1300 04/28/20 1315  BP: 126/88 (!) 130/92  Pulse: 73 72  Resp: 11 10  Temp:    SpO2: 97% 98%    Last Pain:  Vitals:   04/28/20 1315  PainSc: 0-No pain                 Cerinity Zynda L Aylyn Wenzler

## 2020-04-28 NOTE — Anesthesia Preprocedure Evaluation (Addendum)
Anesthesia Evaluation  Patient identified by MRN, date of birth, ID band Patient awake    Reviewed: Allergy & Precautions, NPO status , Patient's Chart, lab work & pertinent test results  History of Anesthesia Complications (+) PONV  Airway Mallampati: II  TM Distance: >3 FB Neck ROM: Full    Dental no notable dental hx. (+) Teeth Intact, Dental Advisory Given   Pulmonary neg pulmonary ROS, former smoker,    Pulmonary exam normal breath sounds clear to auscultation       Cardiovascular hypertension, Pt. on medications Normal cardiovascular exam Rhythm:Regular Rate:Normal     Neuro/Psych PSYCHIATRIC DISORDERS Depression negative neurological ROS     GI/Hepatic Neg liver ROS, GERD  ,  Endo/Other  negative endocrine ROSdiabetes, Type 2, Insulin Dependent  Renal/GU Renal InsufficiencyRenal disease (Cr 4.10, K 4.1)  negative genitourinary   Musculoskeletal negative musculoskeletal ROS (+)   Abdominal   Peds  Hematology  (+) REFUSES BLOOD PRODUCTS,   Anesthesia Other Findings   Reproductive/Obstetrics                            Anesthesia Physical Anesthesia Plan  ASA: III  Anesthesia Plan: MAC   Post-op Pain Management:    Induction: Intravenous  PONV Risk Score and Plan: 2 and Propofol infusion, Treatment may vary due to age or medical condition, Midazolam and Ondansetron  Airway Management Planned: Natural Airway  Additional Equipment:   Intra-op Plan:   Post-operative Plan:   Informed Consent: I have reviewed the patients History and Physical, chart, labs and discussed the procedure including the risks, benefits and alternatives for the proposed anesthesia with the patient or authorized representative who has indicated his/her understanding and acceptance.     Dental advisory given  Plan Discussed with: CRNA  Anesthesia Plan Comments:         Anesthesia Quick  Evaluation

## 2020-04-28 NOTE — Transfer of Care (Signed)
Immediate Anesthesia Transfer of Care Note  Patient: Jorge Higgins  Procedure(s) Performed: TENDON TRANSFER RIGHT FOOT (Right Foot)  Patient Location: PACU  Anesthesia Type:MAC  Level of Consciousness: awake  Airway & Oxygen Therapy: Patient Spontanous Breathing and Patient connected to face mask oxygen  Post-op Assessment: Report given to RN and Post -op Vital signs reviewed and stable  Post vital signs: Reviewed and stable  Last Vitals:  Vitals Value Taken Time  BP    Temp    Pulse    Resp    SpO2      Last Pain:  Vitals:   04/28/20 0856  PainSc: 0-No pain      Patients Stated Pain Goal: 5 (24/26/83 4196)  Complications: No complications documented.

## 2020-04-28 NOTE — Discharge Instructions (Signed)
°  Post Anesthesia Home Care Instructions  Activity: Get plenty of rest for the remainder of the day. A responsible adult should stay with you for 24 hours following the procedure.  For the next 24 hours, DO NOT: -Drive a car -Paediatric nurse -Drink alcoholic beverages -Take any medication unless instructed by your physician -Make any legal decisions or sign important papers.  Meals: Start with liquid foods such as gelatin or soup. Progress to regular foods as tolerated. Avoid greasy, spicy, heavy foods. If nausea and/or vomiting occur, drink only clear liquids until the nausea and/or vomiting subsides. Call your physician if vomiting continues.  Special Instructions/Symptoms: Your throat may feel dry or sore from the anesthesia or the breathing tube placed in your throat during surgery. If this causes discomfort, gargle with warm salt water. The discomfort should disappear within 24 hours.  If you had a scopolamine patch placed behind your ear for the management of post- operative nausea and/or vomiting:  1. The medication in the patch is effective for 72 hours, after which it should be removed.  Wrap patch in a tissue and discard in the trash. Wash hands thoroughly with soap and water. 2. You may remove the patch earlier than 72 hours if you experience unpleasant side effects which may include dry mouth, dizziness or visual disturbances. 3. Avoid touching the patch. Wash your hands with soap and water after contact with the patch.     After Surgery Instructions   1) If you are recuperating from surgery anywhere other than home, please be sure to leave [place] the number where you can be reached.  2) Go directly home and rest.  3) Keep the operated foot(feet) elevated six inches above the hip when sitting or lying down. This will help control swelling and pain.  4) Support the elevated foot and leg with pillows. DO NOT PLACE PILLOWS UNDER THE KNEE.  5) DO NOT REMOVE or get your  bandages WET, unless you were given different instructions by your doctor to do so. This increases the risk of infection.  6) Wear your surgical shoe or surgical boot at all times when you are up on your feet.  7) A limited amount of pain and swelling may occur. The skin may take on a bruised appearance. DO NOT BE ALARMED, THIS IS NORMAL.  8) For slight pain and swelling, apply an ice pack directly over the bandages for 15 minutes only out of each hour of the day. Continue until seen in the office for your first post op visit. DO NOT APPLY ANY FORM OF HEAT TO THE AREA.  9) Have prescriptions filled immediately and take as directed.  10) Drink lots of liquids, water and juice to stay hydrated.  11) CALL IMMEDIATELY IF:  *Bleeding continues until the following day of surgery  *Pain increases and/or does not respond to medication  *Bandages or cast appears to tight  *If your bandage gets wet  *Trip, fall or stump your surgical foot  *If your temperature goes above 101  *If you have ANY questions at all  12) You are expected to be non-weightbearing after your surgery.   If you need to reach the nurse for any reason, please call: [place]: [phone] [place]: [phone] [place]: [phone]

## 2020-04-28 NOTE — Interval H&P Note (Signed)
History and Physical Interval Note:  04/28/2020 10:11 AM  Lakin Chuck Hint  has presented today for surgery, with the diagnosis of VARUS DEFORMITY OF RIGHT ANKLE.  The various methods of treatment have been discussed with the patient and family. After consideration of risks, benefits and other options for treatment, the patient has consented to  Procedure(s): TENDON TRANSFER RIGHT FOOT (Right) as a surgical intervention.  The patient's history has been reviewed, patient examined, no change in status, stable for surgery.  I have reviewed the patient's chart and labs.  Questions were answered to the patient's satisfaction.     [name]

## 2020-04-28 NOTE — Anesthesia Procedure Notes (Signed)
Procedure Name: MAC Date/Time: 04/28/2020 11:16 AM Performed by: Lieutenant Diego, CRNA Pre-anesthesia Checklist: Patient identified, Emergency Drugs available, Suction available, Patient being monitored and Timeout performed Oxygen Delivery Method: Simple face mask Preoxygenation: Pre-oxygenation with 100% oxygen Induction Type: IV induction

## 2020-04-29 ENCOUNTER — Encounter (HOSPITAL_BASED_OUTPATIENT_CLINIC_OR_DEPARTMENT_OTHER): Payer: Self-pay | Admitting: Podiatry

## 2020-05-03 ENCOUNTER — Ambulatory Visit (INDEPENDENT_AMBULATORY_CARE_PROVIDER_SITE_OTHER): Payer: Medicaid Other | Admitting: Podiatry

## 2020-05-03 ENCOUNTER — Other Ambulatory Visit: Payer: Self-pay

## 2020-05-03 DIAGNOSIS — E13621 Other specified diabetes mellitus with foot ulcer: Secondary | ICD-10-CM

## 2020-05-03 DIAGNOSIS — L97514 Non-pressure chronic ulcer of other part of right foot with necrosis of bone: Secondary | ICD-10-CM | POA: Diagnosis not present

## 2020-05-03 NOTE — Progress Notes (Signed)
  Subjective:  Patient ID: Jorge Higgins, male    DOB: Jul 19, 1975,  MRN: [id number]  Chief Complaint  Patient presents with  . Routine Post Op    I am doing better on the right heel area and I was really sick over the christmas holiday   DOS: 01/08/20 Procedure: Right foot wound debridement including bone  45 y.o. male presents with the above complaint.  States that the wound is doing okay denies new issues with his foot is trying to not walk on the foot.  Objective:  Physical Exam: no tenderness at the surgical site, no edema noted and calf supple, nontender. Incision: wound worsened since last visit, no significant erythema, slight clear drainage present 3x2.5 without probe to bone. varus deformity present, chronic Assessment:   1. 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 Acquired varus deformity of right ankle    Plan:  Patient was evaluated and treated and all questions answered.  Post-operative State -Wound is indeed larger today. -Should be okay for surgery as planned -No procedural debridement today. -Dressed wound with Prisma DSD continue soaking regimen

## 2020-05-03 NOTE — Progress Notes (Signed)
  Subjective:  Patient ID: Jorge Higgins, male    DOB: Jul 04, 1975,  MRN: [id number]  No chief complaint on file.  DOS: 01/08/20 Procedure: Right foot wound debridement including bone  DOS: 04/28/20 Procedure: Right PT tendon transection, AT tendon transfer, tarsal tunnel release  45 y.o. male presents for follow up. States the foot is doing ok with some occasional pain. Notices the foot does look different than compared to before surgery. States the right big toenail got caught on something and started to bleed.  Objective:  Physical Exam: no tenderness at the surgical site, no edema noted and calf supple, nontender. Incision: Wound fully granular with some hyperkeratosis; 2.5x3.5, no significant erythema, slight clear drainage present, chronic varus deformity but improved c/t pre-op. Assessment:   1. 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  Post-operative State -Wound debrided as below -Dressed with betadine WTD, and placed back into 3SSLS. -When wound heals plan for further correction of varus deformity.  Procedure: Selective Debridement of Wound Rationale: Removal of devitalized tissue from the wound to promote healing.  Pre-Debridement Wound Measurements: 2.5 cm x 3.5 cm x 0.3 cm  Post-Debridement Wound Measurements: same as pre-debridement. Type of Debridement: sharp selective Tissue Removed: Devitalized soft-tissue Dressing: Dry, sterile, compression dressing. Disposition: Patient tolerated procedure well. Patient to return in 1 week for follow-up.

## 2020-05-10 ENCOUNTER — Encounter: Payer: Medicaid Other | Admitting: Podiatry

## 2020-05-13 ENCOUNTER — Encounter: Payer: Self-pay | Admitting: Podiatry

## 2020-05-13 ENCOUNTER — Ambulatory Visit (INDEPENDENT_AMBULATORY_CARE_PROVIDER_SITE_OTHER): Payer: Medicaid Other

## 2020-05-13 ENCOUNTER — Other Ambulatory Visit: Payer: Self-pay

## 2020-05-13 ENCOUNTER — Ambulatory Visit (INDEPENDENT_AMBULATORY_CARE_PROVIDER_SITE_OTHER): Payer: Medicaid Other | Admitting: Podiatry

## 2020-05-13 DIAGNOSIS — L97514 Non-pressure chronic ulcer of other part of right foot with necrosis of bone: Secondary | ICD-10-CM

## 2020-05-13 DIAGNOSIS — E13621 Other specified diabetes mellitus with foot ulcer: Secondary | ICD-10-CM | POA: Diagnosis not present

## 2020-05-13 NOTE — Progress Notes (Signed)
  Subjective:  Patient ID: Jorge Higgins, male    DOB: 15-Apr-1975,  MRN: [id number]  Chief Complaint  Patient presents with  . Routine Post Op    I am doing good and my blood sugar went up after I ate an orange   DOS: 01/08/20 Procedure: Right foot wound debridement including bone  DOS: 04/28/20 Procedure: Right PT tendon transection, AT tendon transfer, tarsal tunnel release  45 y.o. male presents for follow up. Denies complaints, doing well. Was a little hypoglycemic today but ate an orange and it felt better.  Objective:  Physical Exam: no tenderness at the surgical site, no edema noted and calf supple, nontender. Incision: Wound fully granular with some hyperkeratosis; 2.5x3.5, no significant erythema, slight clear drainage present, chronic varus deformity but improved c/t pre-op. Assessment:   1. 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  Post-operative State -Wound debrided as below -Would benefit from skin graft substitute to promote healing. Will check benefits -Plan for reconstruction at a later date.   Procedure: Selective Debridement of Wound Rationale: Removal of devitalized tissue from the wound to promote healing.  Pre-Debridement Wound Measurements: 2.5 cm x 3 cm x 0.3 cm  Post-Debridement Wound Measurements: same as pre-debridement. Type of Debridement: sharp selective Instrumentation: dermal curette, tissue nipper Tissue Removed: Devitalized soft-tissue Dressing: Dry, sterile, compression dressing. Disposition: Patient tolerated procedure well.

## 2020-05-17 DIAGNOSIS — Z6830 Body mass index (BMI) 30.0-30.9, adult: Secondary | ICD-10-CM | POA: Insufficient documentation

## 2020-05-17 DIAGNOSIS — E661 Drug-induced obesity: Secondary | ICD-10-CM | POA: Insufficient documentation

## 2020-05-17 DIAGNOSIS — S98132A Complete traumatic amputation of one left lesser toe, initial encounter: Secondary | ICD-10-CM | POA: Insufficient documentation

## 2020-05-20 ENCOUNTER — Ambulatory Visit (INDEPENDENT_AMBULATORY_CARE_PROVIDER_SITE_OTHER): Payer: Medicaid Other | Admitting: Podiatry

## 2020-05-20 ENCOUNTER — Other Ambulatory Visit: Payer: Self-pay

## 2020-05-20 DIAGNOSIS — E13621 Other specified diabetes mellitus with foot ulcer: Secondary | ICD-10-CM

## 2020-05-20 DIAGNOSIS — L97514 Non-pressure chronic ulcer of other part of right foot with necrosis of bone: Secondary | ICD-10-CM

## 2020-05-24 ENCOUNTER — Other Ambulatory Visit: Payer: Self-pay

## 2020-05-24 ENCOUNTER — Ambulatory Visit (INDEPENDENT_AMBULATORY_CARE_PROVIDER_SITE_OTHER): Payer: Medicaid Other | Admitting: Podiatry

## 2020-05-24 DIAGNOSIS — E11621 Type 2 diabetes mellitus with foot ulcer: Secondary | ICD-10-CM | POA: Diagnosis not present

## 2020-05-24 DIAGNOSIS — L97413 Non-pressure chronic ulcer of right heel and midfoot with necrosis of muscle: Secondary | ICD-10-CM | POA: Diagnosis not present

## 2020-05-24 NOTE — Progress Notes (Signed)
  Subjective:  Patient ID: Jorge Higgins, male    DOB: [birth year],  MRN: [id number]  No chief complaint on file.  DOS: 01/08/20 Procedure: Right foot wound debridement including bone  DOS: 04/28/20 Procedure: Right PT tendon transection, AT tendon transfer, tarsal tunnel release  45 y.o. male presents for follow up. Thinks the wound is doing better. Continues to be NWB as directed.  Objective:  Physical Exam: no tenderness at the surgical site, no edema noted and calf supple, nontender. Incision: Wound fully granular with some hyperkeratosis; 2.1x3.3x0.3, no significant erythema, slight clear drainage present, chronic varus deformity but improved c/t pre-op. Assessment:   1. Diabetic ulcer of right midfoot associated with type 2 diabetes mellitus, with necrosis of muscle ([place])    Plan:  Patient was evaluated and treated and all questions answered.  Post-operative State -Wound debrided as below. Apligraf applied, adhered with steri strips. LOT #[id number] Exp 05/29/20. Covered with bolster gauze, kerlix, ACE and placed into posterior splint.  Procedure: Selective Debridement of Wound Rationale: Removal of devitalized tissue from the wound to promote healing.  Pre-Debridement Wound Measurements: 2.1 cm x 3.3 cm x 0.3 cm  Post-Debridement Wound Measurements: same as pre-debridement. Type of Debridement: sharp selective Instrumentation: dermal curette, tissue nipper Tissue Removed: Devitalized soft-tissue Dressing: Dry, sterile, compression dressing. Disposition: Patient tolerated procedure well.

## 2020-05-26 ENCOUNTER — Encounter: Payer: Self-pay | Admitting: Podiatry

## 2020-05-31 ENCOUNTER — Encounter: Payer: Medicaid Other | Admitting: Podiatry

## 2020-05-31 NOTE — Progress Notes (Addendum)
  Subjective:  Patient ID: Jorge Higgins, male    DOB: Feb 21, 1976,  MRN: [id number]  Chief Complaint  Patient presents with  . Routine Post Op    POV -pt w/ nausea dn vomiting -pt denies F/Ch - Pt states," I feel like it's getting better." - pt states he can move his foot now, no pian, less drainage - pt denies redness/swelling tx: dry dressing daily   . Diabetes    FBS: 150 a`C: 7.1   DOS: 01/08/20 Procedure: Right foot wound debridement including bone  DOS: 04/28/20 Procedure: Right PT tendon transection, AT tendon transfer, tarsal tunnel release  45 y.o. male presents for follow up.    Objective:  Physical Exam: no tenderness at the surgical site, no edema noted and calf supple, nontender. Incision: Wound fully granular with some hyperkeratosis; 3x3, no significant erythema, slight clear drainage present, chronic varus deformity but improved c/t pre-op. Assessment:   1. 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  Post-operative State -Wound debrided as below -Would benefit from skin graft substitute to promote healing. Graft awaiting delivery. -We discussed doing another surgical procedure in order to correct the soft tissue component prior to osseous correction. Patient amenable. All risks, benefits, and alternatives discussed with patient. No guarantees given. Consent reviewed and signed by patient. -Planned procedures: Application of deformity correction frame right foot -ASA 3 - Patient with moderate systemic disease with functional limitations; Risk factors: DM, Renal dz  Procedure: Selective Debridement of Wound Rationale: Removal of devitalized tissue from the wound to promote healing.  Pre-Debridement Wound Measurements: 3 cm x 3 cm x 0.3 cm  Post-Debridement Wound Measurements: same as pre-debridement. Type of  Debridement: sharp selective Instrumentation: dermal curette, tissue nipper Tissue Removed: Devitalized soft-tissue Dressing: Dry, sterile, compression dressing. Disposition: Patient tolerated procedure well.

## 2020-06-01 NOTE — Op Note (Signed)
Patient Name: Jorge Higgins DOB: 10-24-1975  MRN: [id number]   Date of Surgery: 04/28/2020  Surgeon: Dr. Hardie Pulley, DPM Assistants: Dr. Sherryle Lis  Pre-operative Diagnosis:  Equinovarus deformity with tendon contracture, ulceration lateral foot Post-operative Diagnosis:  same Procedures:  1) Tibialis anterior tendon transfer  2) Posterior tibial tendon release  3) Tarsal tunnel release Pathology/Specimens: * No specimens in log * Anesthesia: General Hemostasis:  Total Tourniquet Time Documented: Calf (Right) - 1 minutes Total: Calf (Right) - 1 minutes  Estimated Blood Loss: 25 ml Materials:  Implant Name Type Inv. Item Serial No. Manufacturer Lot No. LRB No. Used Action  Tenodesis Graft Sizing Kit with Fiber Loop Suture Tape with Needle Graft   [employer] [id number] Right 1 Implanted  SYSTEM IMPLANT FDL 4.75 - [id number] Anchor SYSTEM IMPLANT FDL 4.75  [employer] [id number] Right 1 Implanted   Medications: none Complications: none  Indications for Procedure:  This is a 45 y.o. male with a chronic ulceration to the lateral through the foot with equinovarus deformity.  He has a complicated history of osteomyelitis from stepping on a nail.  He presents today the first step of attempted limb salvage.  He is understanding that we are trying to avoid below-knee amputation and that soft tissue procedures alone are not adequate to treat his deformity and he will later need osseous procedures.  The goal of today surgery is to reduce the deformity so that the lateral wound can heal.   Procedure in Detail: Patient was identified in pre-operative holding area. Formal consent was signed and the right lower extremity was marked. Patient was brought back to the operating room. Anesthesia was induced. The extremity was prepped and draped in the usual sterile fashion. Timeout was taken to confirm patient name, laterality, and procedure prior to incision.  The tourniquet was briefly  inflated but was almost immediately deflated and the procedure was done under what conditions.  [name] was placed over the area of the lateral ulceration to prevent exposure during the case.  Attention was then directed to the anterior tibial tendon.  This was determined to be quite taut.  An incision was made at the insertion of the tibialis anterior for later harvest.  Dissection was continued along the tendon down to level of the tendon insertion on the bone.  The tendon was then transected as most distal portion intact for later transfer.  The posterior tibial tendon was also noted to be quite tight an incision was made over the medial aspect of the ankle.  Dissection was continued down to level of the posterior tibial tendon.  The posterior tibial tendon was then transected.  The tarsal tunnel additionally was released to allow for greater movement of soft tissues.  Incision was then made laterally in the foot over the lateral cuneiform.  The cuneiform was identified via fluoroscopy.  A tendon passer was used to pass the tibialis anterior tendon from its medial portion to newly created area over the lateral cuneiform.  Tenodesis was performed over a standard technique with [employer] tenodesis system measuring size 4.75.  At this point the tendon appeared well inserted in the bone and appeared stable.  The varus deformity was significantly reduced.  All incisions were then copiously irrigated.  The wounds were closed in standard layered fashion.  The foot was then dressed with Xeroform 4 x 4 Kerlix and Ace bandage.  A 3 sided short-leg splint was applied. patient tolerated the procedure well.  Dr. Sherryle Lis was scrubbed and present  for the entire procedure, and assisted in retraction suturing and placement of hardware.  He was medically necessary for the procedure complexity.  Disposition: Following a period of post-operative monitoring, patient will be transferred home.

## 2020-06-03 ENCOUNTER — Other Ambulatory Visit: Payer: Self-pay

## 2020-06-03 ENCOUNTER — Ambulatory Visit (INDEPENDENT_AMBULATORY_CARE_PROVIDER_SITE_OTHER): Payer: Medicaid Other | Admitting: Podiatry

## 2020-06-03 ENCOUNTER — Encounter: Payer: Self-pay | Admitting: Podiatry

## 2020-06-03 DIAGNOSIS — L97413 Non-pressure chronic ulcer of right heel and midfoot with necrosis of muscle: Secondary | ICD-10-CM

## 2020-06-03 DIAGNOSIS — E11621 Type 2 diabetes mellitus with foot ulcer: Secondary | ICD-10-CM

## 2020-06-03 NOTE — Progress Notes (Signed)
  Subjective:  Patient ID: Jorge Higgins, male    DOB: 09/06/1975,  MRN: [id number]  Chief Complaint  Patient presents with  . Routine Post Op    My girl friend stated that it had a smell and I got worried on the right foot    DOS: 01/08/20 Procedure: Right foot wound debridement including bone  DOS: 04/28/20 Procedure: Right PT tendon transection, AT tendon transfer, tarsal tunnel release  45 y.o. male presents for follow up. Had some drainage and malodor to the wound and changed the dressing that Saturday.   Objective:  Physical Exam: no tenderness at the surgical site, no edema noted and calf supple, nontender. Incision: Wound fully granular with some hyperkeratosis; 1.5x2 with slight green tinge to periwound; chronic varus deformity but improved c/t pre-op. Assessment:   1. Diabetic ulcer of right midfoot associated with type 2 diabetes mellitus, with necrosis of muscle ([name])    Plan:  Patient was evaluated and treated and all questions answered.  Post-operative State -Wound markedly improved. -Wound debrided as below. Apligraf applied, 22 of 44 units was applied to the wound. Graft lot [id number] exp [id number]. It was covered with mepitel, steri strips, 4x4, kerlix, ABD, and ACE bandage. Posterior splint reapplied.  Procedure: Selective Debridement of Wound Rationale: Removal of devitalized tissue from the wound to promote healing.  Pre-Debridement Wound Measurements: 1.5 cm x 2 cm x 0.2 cm  Post-Debridement Wound Measurements: same as pre-debridement. Type of Debridement: sharp selective Instrumentation: dermal curette, tissue nipper Tissue Removed: Devitalized soft-tissue Dressing: Dry, sterile, compression dressing. Disposition: Patient tolerated procedure well.

## 2020-06-14 ENCOUNTER — Encounter: Payer: Medicaid Other | Admitting: Podiatry

## 2020-06-14 ENCOUNTER — Other Ambulatory Visit: Payer: Self-pay

## 2020-06-14 ENCOUNTER — Ambulatory Visit (INDEPENDENT_AMBULATORY_CARE_PROVIDER_SITE_OTHER): Payer: Medicaid Other | Admitting: Podiatry

## 2020-06-14 DIAGNOSIS — E11621 Type 2 diabetes mellitus with foot ulcer: Secondary | ICD-10-CM | POA: Diagnosis not present

## 2020-06-14 DIAGNOSIS — L97413 Non-pressure chronic ulcer of right heel and midfoot with necrosis of muscle: Secondary | ICD-10-CM

## 2020-06-14 NOTE — Progress Notes (Signed)
  Subjective:  Patient ID: Jorge Higgins, male    DOB: 1975-07-28,  MRN: [id number]  No chief complaint on file.  DOS: 01/08/20 Procedure: Right foot wound debridement including bone  DOS: 04/28/20 Procedure: Right PT tendon transection, AT tendon transfer, tarsal tunnel release  45 y.o. male presents for follow up. Has not changed the dressing as advised.  Objective:  Physical Exam: no tenderness at the surgical site, no edema noted and calf supple, nontender. Incision: Wound fully granular with some hyperkeratosis; 1.5x2 with slight green tinge to periwound; chronic varus deformity but improved c/t pre-op. Assessment:   1. Diabetic ulcer of right midfoot associated with type 2 diabetes mellitus, with necrosis of muscle ([place])    Plan:  Patient was evaluated and treated and all questions answered.  Post-operative State -Wound about the same as previous. -Wound debrided and Apligraf applied. Lot [id number] exp 06/24/20. Covered with Mepitel, 4x4, ABD, kerlix, ACE -Reapply posterior splint.

## 2020-06-24 ENCOUNTER — Ambulatory Visit (INDEPENDENT_AMBULATORY_CARE_PROVIDER_SITE_OTHER): Payer: Medicaid Other | Admitting: Podiatry

## 2020-06-24 ENCOUNTER — Other Ambulatory Visit: Payer: Self-pay

## 2020-06-24 DIAGNOSIS — L97413 Non-pressure chronic ulcer of right heel and midfoot with necrosis of muscle: Secondary | ICD-10-CM | POA: Diagnosis not present

## 2020-06-24 DIAGNOSIS — E11621 Type 2 diabetes mellitus with foot ulcer: Secondary | ICD-10-CM

## 2020-07-05 ENCOUNTER — Ambulatory Visit (INDEPENDENT_AMBULATORY_CARE_PROVIDER_SITE_OTHER): Payer: Medicaid Other | Admitting: Podiatry

## 2020-07-05 DIAGNOSIS — Z5329 Procedure and treatment not carried out because of patient's decision for other reasons: Secondary | ICD-10-CM

## 2020-07-05 NOTE — Progress Notes (Signed)
No show for appt. Patient was called and states that he was sick and needs to see his eye doctor today. States the foot is doing about the same.

## 2020-07-12 ENCOUNTER — Other Ambulatory Visit: Payer: Self-pay

## 2020-07-12 ENCOUNTER — Ambulatory Visit (INDEPENDENT_AMBULATORY_CARE_PROVIDER_SITE_OTHER): Payer: Medicaid Other | Admitting: Podiatry

## 2020-07-12 DIAGNOSIS — E11621 Type 2 diabetes mellitus with foot ulcer: Secondary | ICD-10-CM | POA: Diagnosis not present

## 2020-07-12 DIAGNOSIS — L97413 Non-pressure chronic ulcer of right heel and midfoot with necrosis of muscle: Secondary | ICD-10-CM

## 2020-07-12 NOTE — Progress Notes (Signed)
  Subjective:  Patient ID: Jorge Higgins, male    DOB: 03/24/1976,  MRN: [id number]  Chief Complaint  Patient presents with  . Routine Post Op    POV -pt denies N/V/F/ch -pt states," a little better, but it doesn't want to close it's still open." - no swelling/redness - less draiange -no pain Tx: silvadene dressing    DOS: 01/08/20 Procedure: Right foot wound debridement including bone  DOS: 04/28/20 Procedure: Right PT tendon transection, AT tendon transfer, tarsal tunnel release  45 y.o. male presents for follow up.   Objective:  Physical Exam: no tenderness at the surgical site, no edema noted and calf supple, nontender. Incision: Wound fully granular with some hyperkeratosis; 1.5x1; chronic varus deformity but improved c/t pre-op. Assessment:   1. Diabetic ulcer of right midfoot associated with type 2 diabetes mellitus, with necrosis of muscle ([place])    Plan:  Patient was evaluated and treated and all questions answered.  Post-operative State -Wound improving. Minimally debrided, donated [place] graft applied, followed by mepitel, 4x4, kerlix, cast padding, and the 3SSLS was reapplied. -Leave intact x1 week, then remove.

## 2020-07-29 ENCOUNTER — Other Ambulatory Visit: Payer: Self-pay

## 2020-07-29 ENCOUNTER — Encounter: Payer: Self-pay | Admitting: Podiatry

## 2020-07-29 ENCOUNTER — Ambulatory Visit (INDEPENDENT_AMBULATORY_CARE_PROVIDER_SITE_OTHER): Payer: Medicaid Other | Admitting: Podiatry

## 2020-07-29 DIAGNOSIS — E11621 Type 2 diabetes mellitus with foot ulcer: Secondary | ICD-10-CM

## 2020-07-29 DIAGNOSIS — L97413 Non-pressure chronic ulcer of right heel and midfoot with necrosis of muscle: Secondary | ICD-10-CM | POA: Diagnosis not present

## 2020-07-29 NOTE — Progress Notes (Signed)
  Subjective:  Patient ID: Jorge Higgins, male    DOB: 1975-06-27,  MRN: [id number]  Chief Complaint  Patient presents with  . Routine Post Op    I think that the area on the right foot is doing better and Dr March Rummage has been using a graft   DOS: 01/08/20 Procedure: Right foot wound debridement including bone  DOS: 04/28/20 Procedure: Right PT tendon transection, AT tendon transfer, tarsal tunnel release  45 y.o. male presents for follow up.   Objective:  Physical Exam: no tenderness at the surgical site, no edema noted and calf supple, nontender. Incision: Wound fully granular with some hyperkeratosis; 3x2; chronic varus deformity but improved c/t pre-op. Assessment:   1. Diabetic ulcer of right midfoot associated with type 2 diabetes mellitus, with necrosis of muscle ([place])    Plan:  Patient was evaluated and treated and all questions answered.  Post-operative State -Wound slightly worsened this week. Minimally debrided. Dressed with betadine WTD. Patient to continue daily dressing changes. 3SSLS was reapplied.  -We did discuss that putting too much pressure on the foot will stall healing. He states he is not putting much pressure on it. -Will trial mechanical wound VAC to promote healing if warranted next visit. Will check into whether pre-cert is required.

## 2020-08-03 ENCOUNTER — Encounter: Payer: Self-pay | Admitting: Podiatry

## 2020-08-06 NOTE — Progress Notes (Signed)
  Subjective:  Patient ID: Jorge Higgins, male    DOB: [birth year],  MRN: [id number]  No chief complaint on file.  DOS: 01/08/20 Procedure: Right foot wound debridement including bone  DOS: 04/28/20 Procedure: Right PT tendon transection, AT tendon transfer, tarsal tunnel release  45 y.o. male presents for follow up.  Thinks that the right foot is doing much better.  Objective:  Physical Exam: no tenderness at the surgical site, no edema noted and calf supple, nontender. Incision: Wound fully granular with some hyperkeratosis; 2*1 with slight green tinge to periwound; chronic varus deformity but improved c/t pre-op. Assessment:   1. Diabetic ulcer of right midfoot associated with type 2 diabetes mellitus, with necrosis of muscle ([place])    Plan:  Patient was evaluated and treated and all questions answered.  Post-operative State -Hold off graft application today.  Dressed with Betadine wet-to-dry due to maceration -Reapply posterior splint. -Follow-up in 2 weeks for recheck  No follow-ups on file.

## 2020-08-09 ENCOUNTER — Ambulatory Visit: Payer: Medicaid Other | Admitting: Podiatry

## 2020-08-19 ENCOUNTER — Encounter: Payer: Self-pay | Admitting: Podiatry

## 2020-08-19 ENCOUNTER — Ambulatory Visit (INDEPENDENT_AMBULATORY_CARE_PROVIDER_SITE_OTHER): Payer: Medicaid Other

## 2020-08-19 ENCOUNTER — Ambulatory Visit (INDEPENDENT_AMBULATORY_CARE_PROVIDER_SITE_OTHER): Payer: Medicaid Other | Admitting: Podiatry

## 2020-08-19 ENCOUNTER — Other Ambulatory Visit: Payer: Self-pay

## 2020-08-19 DIAGNOSIS — E11621 Type 2 diabetes mellitus with foot ulcer: Secondary | ICD-10-CM

## 2020-08-19 DIAGNOSIS — L97413 Non-pressure chronic ulcer of right heel and midfoot with necrosis of muscle: Secondary | ICD-10-CM

## 2020-08-19 DIAGNOSIS — L97514 Non-pressure chronic ulcer of other part of right foot with necrosis of bone: Secondary | ICD-10-CM

## 2020-08-19 DIAGNOSIS — M21171 Varus deformity, not elsewhere classified, right ankle: Secondary | ICD-10-CM

## 2020-08-19 DIAGNOSIS — E13621 Other specified diabetes mellitus with foot ulcer: Secondary | ICD-10-CM

## 2020-08-19 NOTE — Progress Notes (Signed)
  Subjective:  Patient ID: Jorge Higgins, male    DOB: [birth year],  MRN: [id number]  Chief Complaint  Patient presents with  . Routine Post Op    Doing better on the right foot and I have been sick and I have not been here for about 3 weeks   DOS: 01/08/20 Procedure: Right foot wound debridement including bone  DOS: 04/28/20 Procedure: Right PT tendon transection, AT tendon transfer, tarsal tunnel release  45 y.o. male presents for follow up.   Objective:  Physical Exam: no tenderness at the surgical site, no edema noted and calf supple, nontender. Incision: Wound fully granular with some hyperkeratosis; 4*1.5; chronic varus deformity   Assessment:   1. Diabetic ulcer of right midfoot associated with type 2 diabetes mellitus, with necrosis of muscle ([place])   2. 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  Post-operative State -New XR today for surgical planning.  X-rays reviewed with patient -Wound again larger. Discussed making sure he is not putting weight on the foot. -Selective debridement as below -Dressed with betadine around periphery and prisma centrally, followed by DSD. -Plan for Mechanical wound VAC at later visit.  Procedure: Selective Debridement of Wound Rationale: Removal of devitalized tissue from the wound to promote healing.  Pre-Debridement Wound Measurements: 4 cm x 1.5 cm x 1 cm  Post-Debridement Wound Measurements: same as pre-debridement. Type of Debridement: sharp selective Instrumentation: dermal curette, tissue nipper Tissue Removed: Devitalized soft-tissue Dressing: Dry, sterile, compression dressing. Disposition: Patient tolerated procedure well.

## 2020-08-31 DIAGNOSIS — E66811 Obesity, class 1: Secondary | ICD-10-CM | POA: Insufficient documentation

## 2020-08-31 DIAGNOSIS — E43 Unspecified severe protein-calorie malnutrition: Secondary | ICD-10-CM | POA: Insufficient documentation

## 2020-08-31 DIAGNOSIS — E669 Obesity, unspecified: Secondary | ICD-10-CM | POA: Insufficient documentation

## 2020-09-06 ENCOUNTER — Telehealth: Payer: Self-pay | Admitting: Podiatry

## 2020-09-06 NOTE — Telephone Encounter (Signed)
FYI pt called to say he was seen in [facility] for osteomylitis.  States they wanted to do BKA and he declined.  He has appt Thurs and will discuss with you then.

## 2020-09-06 NOTE — Telephone Encounter (Signed)
Noted thanks °

## 2020-09-09 ENCOUNTER — Ambulatory Visit: Payer: Medicaid Other | Admitting: Podiatry

## 2020-09-09 ENCOUNTER — Other Ambulatory Visit: Payer: Self-pay

## 2020-09-09 ENCOUNTER — Encounter: Payer: Self-pay | Admitting: Podiatry

## 2020-09-09 DIAGNOSIS — L97413 Non-pressure chronic ulcer of right heel and midfoot with necrosis of muscle: Secondary | ICD-10-CM | POA: Diagnosis not present

## 2020-09-09 DIAGNOSIS — L97514 Non-pressure chronic ulcer of other part of right foot with necrosis of bone: Secondary | ICD-10-CM | POA: Diagnosis not present

## 2020-09-09 DIAGNOSIS — M86671 Other chronic osteomyelitis, right ankle and foot: Secondary | ICD-10-CM

## 2020-09-09 DIAGNOSIS — E13621 Other specified diabetes mellitus with foot ulcer: Secondary | ICD-10-CM

## 2020-09-09 DIAGNOSIS — E11621 Type 2 diabetes mellitus with foot ulcer: Secondary | ICD-10-CM

## 2020-09-09 DIAGNOSIS — M21171 Varus deformity, not elsewhere classified, right ankle: Secondary | ICD-10-CM

## 2020-10-01 NOTE — Progress Notes (Signed)
  Subjective:  Patient ID: Jorge Higgins, male    DOB: January 22, 1976,  MRN: [id number]  Chief Complaint  Patient presents with   Routine Post Op    I was sick last week and had a fever and went to the ER and they did an MRI and said I had osteomylitis wanted to surgery on the right ankle   DOS: 01/08/20 Procedure: Right foot wound debridement including bone  DOS: 04/28/20 Procedure: Right PT tendon transection, AT tendon transfer, tarsal tunnel release  45 y.o. male presents for follow up.   Objective:  Physical Exam: no tenderness at the surgical site, no edema noted and calf supple, nontender. Incision: Wound fibroglandular granular with some hyperkeratosis; 5 x 3 with exposed calcaneus bone; chronic varus deformity   Assessment:   1. Diabetic ulcer of right midfoot associated with type 2 diabetes mellitus, with necrosis of muscle ([place])   2. 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 Other chronic osteomyelitis of right foot ([place])   4. Acquired varus deformity of right ankle     Plan:  Patient was evaluated and treated and all questions answered.  Post-operative State -Wound examined.  Dressed with Betadine wet-to-dry no debridement indicated today -MRI and admission notes reviewed.  Wound is indeed worsened today with exposed bone. We discussed that given the extent of exposed bone this will likely be a very difficult road to salvage that I do not think will ultimately be successful. I do think amputation is a reasonable option.  Patient verbalized understanding and is scheduled to have the procedure done.  I advised him that unless he is here if he has any other issues with his other foot and I wish him well with his recovery.

## 2020-10-11 ENCOUNTER — Ambulatory Visit (INDEPENDENT_AMBULATORY_CARE_PROVIDER_SITE_OTHER): Payer: Medicaid Other | Admitting: Podiatry

## 2020-10-11 DIAGNOSIS — Z5329 Procedure and treatment not carried out because of patient's decision for other reasons: Secondary | ICD-10-CM

## 2021-04-07 ENCOUNTER — Ambulatory Visit: Payer: Medicaid Other | Admitting: Podiatry
